# Patient Record
Sex: Male | Born: 1950
Health system: Southern US, Community
[De-identification: ages and names within clinical notes are randomized; demographics above are authoritative.]

## PROBLEM LIST (undated history)

## (undated) DIAGNOSIS — R413 Other amnesia: Secondary | ICD-10-CM

## (undated) DIAGNOSIS — N189 Chronic kidney disease, unspecified: Secondary | ICD-10-CM

## (undated) DIAGNOSIS — I4891 Unspecified atrial fibrillation: Secondary | ICD-10-CM

## (undated) DIAGNOSIS — K219 Gastro-esophageal reflux disease without esophagitis: Secondary | ICD-10-CM

## (undated) DIAGNOSIS — G8194 Hemiplegia, unspecified affecting left nondominant side: Secondary | ICD-10-CM

## (undated) DIAGNOSIS — I77811 Abdominal aortic ectasia: Secondary | ICD-10-CM

## (undated) DIAGNOSIS — N390 Urinary tract infection, site not specified: Secondary | ICD-10-CM

## (undated) DIAGNOSIS — B952 Enterococcus as the cause of diseases classified elsewhere: Secondary | ICD-10-CM

## (undated) DIAGNOSIS — E559 Vitamin D deficiency, unspecified: Secondary | ICD-10-CM

## (undated) DIAGNOSIS — N2581 Secondary hyperparathyroidism of renal origin: Secondary | ICD-10-CM

## (undated) DIAGNOSIS — E039 Hypothyroidism, unspecified: Secondary | ICD-10-CM

## (undated) DIAGNOSIS — I340 Nonrheumatic mitral (valve) insufficiency: Secondary | ICD-10-CM

## (undated) DIAGNOSIS — I351 Nonrheumatic aortic (valve) insufficiency: Secondary | ICD-10-CM

## (undated) DIAGNOSIS — N289 Disorder of kidney and ureter, unspecified: Secondary | ICD-10-CM

## (undated) DIAGNOSIS — E0500 Thyrotoxicosis with diffuse goiter without thyrotoxic crisis or storm: Secondary | ICD-10-CM

## (undated) DIAGNOSIS — J984 Other disorders of lung: Secondary | ICD-10-CM

## (undated) DIAGNOSIS — M109 Gout, unspecified: Secondary | ICD-10-CM

## (undated) DIAGNOSIS — Z8639 Personal history of other endocrine, nutritional and metabolic disease: Secondary | ICD-10-CM

## (undated) DIAGNOSIS — I639 Cerebral infarction, unspecified: Secondary | ICD-10-CM

## (undated) DIAGNOSIS — E785 Hyperlipidemia, unspecified: Secondary | ICD-10-CM

## (undated) DIAGNOSIS — I10 Essential (primary) hypertension: Secondary | ICD-10-CM

## (undated) HISTORY — DX: Other disorders of lung: J98.4

## (undated) HISTORY — DX: Hypomagnesemia: E83.42

## (undated) HISTORY — DX: Urinary tract infection, site not specified: N39.0

## (undated) HISTORY — DX: Hyperlipidemia, unspecified: E78.5

## (undated) HISTORY — DX: Secondary hyperparathyroidism of renal origin: N25.81

## (undated) HISTORY — DX: Cerebral infarction, unspecified: I63.9

## (undated) HISTORY — DX: Hemiplegia, unspecified affecting left nondominant side: G81.94

## (undated) HISTORY — DX: Abdominal aortic ectasia: I77.811

## (undated) HISTORY — DX: Unspecified atrial fibrillation: I48.91

## (undated) HISTORY — DX: Personal history of other endocrine, nutritional and metabolic disease: Z86.39

## (undated) HISTORY — DX: Disorder of kidney and ureter, unspecified: N28.9

## (undated) HISTORY — DX: Gout, unspecified: M10.9

## (undated) HISTORY — DX: Hypothyroidism, unspecified: E03.9

## (undated) HISTORY — DX: Essential (primary) hypertension: I10

## (undated) HISTORY — DX: Thyrotoxicosis with diffuse goiter without thyrotoxic crisis or storm: E05.00

## (undated) HISTORY — DX: Enterococcus as the cause of diseases classified elsewhere: B95.2

## (undated) HISTORY — DX: Vitamin D deficiency, unspecified: E55.9

## (undated) HISTORY — DX: Chronic kidney disease, unspecified: N18.9

---

## 2009-06-22 ENCOUNTER — Emergency Department: Payer: Self-pay | Admitting: Emergency Medicine

## 2013-10-19 ENCOUNTER — Ambulatory Visit: Payer: Self-pay | Admitting: Physician Assistant

## 2014-02-11 DIAGNOSIS — N390 Urinary tract infection, site not specified: Secondary | ICD-10-CM

## 2014-02-11 DIAGNOSIS — B952 Enterococcus as the cause of diseases classified elsewhere: Secondary | ICD-10-CM

## 2014-02-11 DIAGNOSIS — G8194 Hemiplegia, unspecified affecting left nondominant side: Secondary | ICD-10-CM | POA: Insufficient documentation

## 2014-02-11 HISTORY — DX: Enterococcus as the cause of diseases classified elsewhere: B95.2

## 2014-02-11 HISTORY — DX: Hemiplegia, unspecified affecting left nondominant side: G81.94

## 2014-02-11 HISTORY — DX: Enterococcus as the cause of diseases classified elsewhere: N39.0

## 2014-02-20 DIAGNOSIS — I639 Cerebral infarction, unspecified: Secondary | ICD-10-CM

## 2014-02-20 HISTORY — DX: Cerebral infarction, unspecified: I63.9

## 2014-02-21 DIAGNOSIS — I69391 Dysphagia following cerebral infarction: Secondary | ICD-10-CM | POA: Insufficient documentation

## 2014-02-21 DIAGNOSIS — Z8673 Personal history of transient ischemic attack (TIA), and cerebral infarction without residual deficits: Secondary | ICD-10-CM | POA: Insufficient documentation

## 2014-02-28 DIAGNOSIS — Z8739 Personal history of other diseases of the musculoskeletal system and connective tissue: Secondary | ICD-10-CM | POA: Insufficient documentation

## 2014-02-28 DIAGNOSIS — E039 Hypothyroidism, unspecified: Secondary | ICD-10-CM | POA: Insufficient documentation

## 2014-06-15 ENCOUNTER — Ambulatory Visit: Payer: Self-pay | Admitting: Family Medicine

## 2014-06-21 ENCOUNTER — Ambulatory Visit: Payer: Self-pay | Admitting: Family Medicine

## 2014-07-07 ENCOUNTER — Ambulatory Visit: Payer: Self-pay | Admitting: Gastroenterology

## 2014-07-07 LAB — HM COLONOSCOPY

## 2014-07-08 LAB — PATHOLOGY REPORT

## 2014-07-14 HISTORY — PX: POLYPECTOMY: SHX149

## 2014-07-14 HISTORY — PX: OTHER SURGICAL HISTORY: SHX169

## 2014-07-21 ENCOUNTER — Ambulatory Visit: Payer: Self-pay | Admitting: Surgery

## 2014-07-21 ENCOUNTER — Ambulatory Visit: Payer: Self-pay | Admitting: Rheumatology

## 2014-07-21 ENCOUNTER — Ambulatory Visit: Payer: Self-pay | Admitting: Cardiology

## 2014-07-21 LAB — HEPATIC FUNCTION PANEL A (ARMC)
ALBUMIN: 3.5 g/dL (ref 3.4–5.0)
Alkaline Phosphatase: 129 U/L — ABNORMAL HIGH
BILIRUBIN TOTAL: 0.6 mg/dL (ref 0.2–1.0)
Bilirubin, Direct: <0.1 mg/dL (ref 0.00–0.20)
SGOT(AST): 20 U/L (ref 15–37)
SGPT (ALT): 24 U/L
Total Protein: 7.5 g/dL (ref 6.4–8.2)

## 2014-07-21 LAB — CBC WITH DIFFERENTIAL/PLATELET
Basophil #: 0.1 10*3/uL (ref 0.0–0.1)
Basophil %: 0.8 %
EOS PCT: 1.2 %
Eosinophil #: 0.1 10*3/uL (ref 0.0–0.7)
HCT: 39.9 % — ABNORMAL LOW (ref 40.0–52.0)
HGB: 12.9 g/dL — AB (ref 13.0–18.0)
Lymphocyte #: 1.2 10*3/uL (ref 1.0–3.6)
Lymphocyte %: 12.9 %
MCH: 31.1 pg (ref 26.0–34.0)
MCHC: 32.3 g/dL (ref 32.0–36.0)
MCV: 97 fL (ref 80–100)
MONOS PCT: 5.4 %
Monocyte #: 0.5 x10 3/mm (ref 0.2–1.0)
Neutrophil #: 7.1 10*3/uL — ABNORMAL HIGH (ref 1.4–6.5)
Neutrophil %: 79.7 %
Platelet: 197 10*3/uL (ref 150–440)
RBC: 4.14 10*6/uL — ABNORMAL LOW (ref 4.40–5.90)
RDW: 15.2 % — AB (ref 11.5–14.5)
WBC: 8.9 10*3/uL (ref 3.8–10.6)

## 2014-07-21 LAB — BASIC METABOLIC PANEL
Anion Gap: 5 — ABNORMAL LOW (ref 7–16)
BUN: 19 mg/dL — ABNORMAL HIGH (ref 7–18)
CALCIUM: 9.2 mg/dL (ref 8.5–10.1)
CHLORIDE: 103 mmol/L (ref 98–107)
Co2: 31 mmol/L (ref 21–32)
Creatinine: 1.33 mg/dL — ABNORMAL HIGH (ref 0.60–1.30)
EGFR (African American): >60
EGFR (Non-African Amer.): 58 — ABNORMAL LOW
Glucose: 94 mg/dL (ref 65–99)
Osmolality: 280 (ref 275–301)
Potassium: 4.8 mmol/L (ref 3.5–5.1)
SODIUM: 139 mmol/L (ref 136–145)

## 2014-07-21 LAB — URIC ACID: Uric Acid: 5 mg/dL (ref 3.5–7.2)

## 2014-07-27 ENCOUNTER — Inpatient Hospital Stay: Payer: Self-pay | Admitting: Surgery

## 2014-07-28 LAB — BASIC METABOLIC PANEL
Anion Gap: 10 (ref 7–16)
Anion Gap: 9 (ref 7–16)
BUN: 30 mg/dL — ABNORMAL HIGH (ref 7–18)
BUN: 34 mg/dL — ABNORMAL HIGH (ref 7–18)
CALCIUM: 7.9 mg/dL — AB (ref 8.5–10.1)
CHLORIDE: 104 mmol/L (ref 98–107)
CHLORIDE: 104 mmol/L (ref 98–107)
CREATININE: 2.75 mg/dL — AB (ref 0.60–1.30)
Calcium, Total: 7.8 mg/dL — ABNORMAL LOW (ref 8.5–10.1)
Co2: 25 mmol/L (ref 21–32)
Co2: 26 mmol/L (ref 21–32)
Creatinine: 3.2 mg/dL — ABNORMAL HIGH (ref 0.60–1.30)
EGFR (African American): 25 — ABNORMAL LOW
EGFR (Non-African Amer.): 21 — ABNORMAL LOW
GFR CALC AF AMER: 30 — AB
GFR CALC NON AF AMER: 25 — AB
GLUCOSE: 138 mg/dL — AB (ref 65–99)
Glucose: 145 mg/dL — ABNORMAL HIGH (ref 65–99)
Osmolality: 286 (ref 275–301)
Osmolality: 287 (ref 275–301)
POTASSIUM: 4.7 mmol/L (ref 3.5–5.1)
Potassium: 5.2 mmol/L — ABNORMAL HIGH (ref 3.5–5.1)
Sodium: 139 mmol/L (ref 136–145)
Sodium: 139 mmol/L (ref 136–145)

## 2014-07-28 LAB — CBC WITH DIFFERENTIAL/PLATELET
BASOS ABS: 0 10*3/uL (ref 0.0–0.1)
BASOS ABS: 0 10*3/uL (ref 0.0–0.1)
Basophil #: 0 10*3/uL (ref 0.0–0.1)
Basophil %: 0.2 %
Basophil %: 0.1 %
Basophil %: 0.2 %
EOS ABS: 0 10*3/uL (ref 0.0–0.7)
EOS PCT: 0 %
Eosinophil #: 0 10*3/uL (ref 0.0–0.7)
Eosinophil #: 0 10*3/uL (ref 0.0–0.7)
Eosinophil %: 0 %
Eosinophil %: 0 %
HCT: 34.1 % — ABNORMAL LOW (ref 40.0–52.0)
HCT: 29.4 % — ABNORMAL LOW (ref 40.0–52.0)
HCT: 29.4 % — AB (ref 40.0–52.0)
HGB: 11 g/dL — ABNORMAL LOW (ref 13.0–18.0)
HGB: 9.3 g/dL — ABNORMAL LOW (ref 13.0–18.0)
HGB: 9.4 g/dL — AB (ref 13.0–18.0)
LYMPHS ABS: 1.1 10*3/uL (ref 1.0–3.6)
LYMPHS ABS: 1 10*3/uL (ref 1.0–3.6)
LYMPHS PCT: 5.7 %
LYMPHS PCT: 8.2 %
LYMPHS PCT: 7.4 %
Lymphocyte #: 0.9 10*3/uL — ABNORMAL LOW (ref 1.0–3.6)
MCH: 31.4 pg (ref 26.0–34.0)
MCH: 30.7 pg (ref 26.0–34.0)
MCH: 30.9 pg (ref 26.0–34.0)
MCHC: 32.4 g/dL (ref 32.0–36.0)
MCHC: 31.5 g/dL — AB (ref 32.0–36.0)
MCHC: 31.9 g/dL — ABNORMAL LOW (ref 32.0–36.0)
MCV: 97 fL (ref 80–100)
MCV: 98 fL (ref 80–100)
MCV: 97 fL (ref 80–100)
MONO ABS: 1.2 x10 3/mm — AB (ref 0.2–1.0)
MONO ABS: 1.2 x10 3/mm — AB (ref 0.2–1.0)
MONOS PCT: 8.2 %
MONOS PCT: 8.5 %
MONOS PCT: 9.5 %
Monocyte #: 1.1 x10 3/mm — ABNORMAL HIGH (ref 0.2–1.0)
NEUTROS PCT: 82.9 %
Neutrophil #: 13 10*3/uL — ABNORMAL HIGH (ref 1.4–6.5)
Neutrophil #: 11 10*3/uL — ABNORMAL HIGH (ref 1.4–6.5)
Neutrophil #: 10.7 10*3/uL — ABNORMAL HIGH (ref 1.4–6.5)
Neutrophil %: 85.9 %
Neutrophil %: 83.2 %
PLATELETS: 210 10*3/uL (ref 150–440)
PLATELETS: 181 10*3/uL (ref 150–440)
Platelet: 184 10*3/uL (ref 150–440)
RBC: 3.52 10*6/uL — ABNORMAL LOW (ref 4.40–5.90)
RBC: 3.02 10*6/uL — AB (ref 4.40–5.90)
RBC: 3.04 10*6/uL — AB (ref 4.40–5.90)
RDW: 15.6 % — ABNORMAL HIGH (ref 11.5–14.5)
RDW: 15.1 % — AB (ref 11.5–14.5)
RDW: 15.1 % — ABNORMAL HIGH (ref 11.5–14.5)
WBC: 15.2 10*3/uL — ABNORMAL HIGH (ref 3.8–10.6)
WBC: 13.2 10*3/uL — ABNORMAL HIGH (ref 3.8–10.6)
WBC: 12.9 10*3/uL — ABNORMAL HIGH (ref 3.8–10.6)

## 2014-07-28 LAB — MAGNESIUM: Magnesium: 1 mg/dL — ABNORMAL LOW

## 2014-07-29 LAB — COMPREHENSIVE METABOLIC PANEL
ALBUMIN: 2.4 g/dL — AB (ref 3.4–5.0)
AST: 26 U/L (ref 15–37)
Alkaline Phosphatase: 72 U/L
Anion Gap: 6 — ABNORMAL LOW (ref 7–16)
BILIRUBIN TOTAL: 0.4 mg/dL (ref 0.2–1.0)
BUN: 33 mg/dL — ABNORMAL HIGH (ref 7–18)
Calcium, Total: 7.8 mg/dL — ABNORMAL LOW (ref 8.5–10.1)
Chloride: 103 mmol/L (ref 98–107)
Co2: 26 mmol/L (ref 21–32)
Creatinine: 2.48 mg/dL — ABNORMAL HIGH (ref 0.60–1.30)
EGFR (African American): 34 — ABNORMAL LOW
EGFR (Non-African Amer.): 28 — ABNORMAL LOW
Glucose: 117 mg/dL — ABNORMAL HIGH (ref 65–99)
OSMOLALITY: 278 (ref 275–301)
Potassium: 4.4 mmol/L (ref 3.5–5.1)
SGPT (ALT): 17 U/L
Sodium: 135 mmol/L — ABNORMAL LOW (ref 136–145)
TOTAL PROTEIN: 5.5 g/dL — AB (ref 6.4–8.2)

## 2014-07-29 LAB — CBC WITH DIFFERENTIAL/PLATELET
BASOS ABS: 0 10*3/uL (ref 0.0–0.1)
Basophil %: 0.1 %
EOS ABS: 0 10*3/uL (ref 0.0–0.7)
EOS PCT: 0.1 %
HCT: 24.8 % — AB (ref 40.0–52.0)
HGB: 8.2 g/dL — ABNORMAL LOW (ref 13.0–18.0)
LYMPHS ABS: 1 10*3/uL (ref 1.0–3.6)
Lymphocyte %: 9.1 %
MCH: 31.7 pg (ref 26.0–34.0)
MCHC: 33 g/dL (ref 32.0–36.0)
MCV: 96 fL (ref 80–100)
Monocyte #: 1 x10 3/mm (ref 0.2–1.0)
Monocyte %: 8.9 %
Neutrophil #: 8.8 10*3/uL — ABNORMAL HIGH (ref 1.4–6.5)
Neutrophil %: 81.8 %
Platelet: 157 10*3/uL (ref 150–440)
RBC: 2.59 10*6/uL — ABNORMAL LOW (ref 4.40–5.90)
RDW: 14.9 % — AB (ref 11.5–14.5)
WBC: 10.7 10*3/uL — AB (ref 3.8–10.6)

## 2014-07-29 LAB — PATHOLOGY REPORT

## 2014-07-30 LAB — BASIC METABOLIC PANEL
Anion Gap: 9 (ref 7–16)
BUN: 21 mg/dL — ABNORMAL HIGH (ref 7–18)
Calcium, Total: 8 mg/dL — ABNORMAL LOW (ref 8.5–10.1)
Chloride: 101 mmol/L (ref 98–107)
Co2: 25 mmol/L (ref 21–32)
Creatinine: 1.74 mg/dL — ABNORMAL HIGH (ref 0.60–1.30)
EGFR (African American): 51 — ABNORMAL LOW
EGFR (Non-African Amer.): 42 — ABNORMAL LOW
GLUCOSE: 123 mg/dL — AB (ref 65–99)
Osmolality: 274 (ref 275–301)
POTASSIUM: 4.2 mmol/L (ref 3.5–5.1)
Sodium: 135 mmol/L — ABNORMAL LOW (ref 136–145)

## 2014-07-30 LAB — CBC WITH DIFFERENTIAL/PLATELET
Basophil #: 0 10*3/uL (ref 0.0–0.1)
Basophil %: 0.1 %
Eosinophil #: 0 10*3/uL (ref 0.0–0.7)
Eosinophil %: 0.1 %
HCT: 26 % — ABNORMAL LOW (ref 40.0–52.0)
HGB: 8 g/dL — AB (ref 13.0–18.0)
LYMPHS PCT: 4 %
Lymphocyte #: 0.5 10*3/uL — ABNORMAL LOW (ref 1.0–3.6)
MCH: 29.3 pg (ref 26.0–34.0)
MCHC: 30.8 g/dL — ABNORMAL LOW (ref 32.0–36.0)
MCV: 95 fL (ref 80–100)
MONOS PCT: 7.7 %
Monocyte #: 0.9 x10 3/mm (ref 0.2–1.0)
NEUTROS ABS: 10.4 10*3/uL — AB (ref 1.4–6.5)
NEUTROS PCT: 88.1 %
Platelet: 148 10*3/uL — ABNORMAL LOW (ref 150–440)
RBC: 2.74 10*6/uL — AB (ref 4.40–5.90)
RDW: 17 % — ABNORMAL HIGH (ref 11.5–14.5)
WBC: 11.8 10*3/uL — ABNORMAL HIGH (ref 3.8–10.6)

## 2014-07-31 LAB — CBC WITH DIFFERENTIAL/PLATELET
BASOS ABS: 0 10*3/uL (ref 0.0–0.1)
Basophil %: 0.2 %
EOS PCT: 0.1 %
Eosinophil #: 0 10*3/uL (ref 0.0–0.7)
HCT: 25.6 % — ABNORMAL LOW (ref 40.0–52.0)
HGB: 8.4 g/dL — ABNORMAL LOW (ref 13.0–18.0)
LYMPHS PCT: 4 %
Lymphocyte #: 0.5 10*3/uL — ABNORMAL LOW (ref 1.0–3.6)
MCH: 30.9 pg (ref 26.0–34.0)
MCHC: 32.8 g/dL (ref 32.0–36.0)
MCV: 94 fL (ref 80–100)
Monocyte #: 1.2 x10 3/mm — ABNORMAL HIGH (ref 0.2–1.0)
Monocyte %: 9.1 %
Neutrophil #: 11.7 10*3/uL — ABNORMAL HIGH (ref 1.4–6.5)
Neutrophil %: 86.6 %
Platelet: 162 10*3/uL (ref 150–440)
RBC: 2.72 10*6/uL — ABNORMAL LOW (ref 4.40–5.90)
RDW: 16.3 % — AB (ref 11.5–14.5)
WBC: 13.5 10*3/uL — ABNORMAL HIGH (ref 3.8–10.6)

## 2014-07-31 LAB — COMPREHENSIVE METABOLIC PANEL
ALBUMIN: 2 g/dL — AB (ref 3.4–5.0)
ALK PHOS: 83 U/L
AST: 18 U/L (ref 15–37)
Anion Gap: 9 (ref 7–16)
BUN: 19 mg/dL — ABNORMAL HIGH (ref 7–18)
Bilirubin,Total: 1.1 mg/dL — ABNORMAL HIGH (ref 0.2–1.0)
CALCIUM: 8.1 mg/dL — AB (ref 8.5–10.1)
CHLORIDE: 99 mmol/L (ref 98–107)
CREATININE: 1.54 mg/dL — AB (ref 0.60–1.30)
Co2: 25 mmol/L (ref 21–32)
EGFR (African American): 59 — ABNORMAL LOW
EGFR (Non-African Amer.): 49 — ABNORMAL LOW
Glucose: 119 mg/dL — ABNORMAL HIGH (ref 65–99)
Osmolality: 270 (ref 275–301)
POTASSIUM: 4.2 mmol/L (ref 3.5–5.1)
SGPT (ALT): 11 U/L — ABNORMAL LOW
SODIUM: 133 mmol/L — AB (ref 136–145)
TOTAL PROTEIN: 5.8 g/dL — AB (ref 6.4–8.2)

## 2014-08-01 LAB — CBC WITH DIFFERENTIAL/PLATELET
BASOS ABS: 0 10*3/uL (ref 0.0–0.1)
Basophil %: 0.3 %
EOS PCT: 0.5 %
Eosinophil #: 0.1 10*3/uL (ref 0.0–0.7)
HCT: 30.5 % — ABNORMAL LOW (ref 40.0–52.0)
HGB: 9.8 g/dL — ABNORMAL LOW (ref 13.0–18.0)
LYMPHS ABS: 0.6 10*3/uL — AB (ref 1.0–3.6)
LYMPHS PCT: 3.5 %
MCH: 30.3 pg (ref 26.0–34.0)
MCHC: 32.2 g/dL (ref 32.0–36.0)
MCV: 94 fL (ref 80–100)
MONO ABS: 1 x10 3/mm (ref 0.2–1.0)
Monocyte %: 6.2 %
NEUTROS ABS: 14.8 10*3/uL — AB (ref 1.4–6.5)
NEUTROS PCT: 89.5 %
Platelet: 224 10*3/uL (ref 150–440)
RBC: 3.24 10*6/uL — AB (ref 4.40–5.90)
RDW: 16.4 % — ABNORMAL HIGH (ref 11.5–14.5)
WBC: 16.6 10*3/uL — AB (ref 3.8–10.6)

## 2014-08-01 LAB — COMPREHENSIVE METABOLIC PANEL
ALBUMIN: 1.9 g/dL — AB (ref 3.4–5.0)
ALK PHOS: 93 U/L
ALT: 10 U/L — AB
AST: 15 U/L (ref 15–37)
Anion Gap: 9 (ref 7–16)
BILIRUBIN TOTAL: 1.2 mg/dL — AB (ref 0.2–1.0)
BUN: 22 mg/dL — ABNORMAL HIGH (ref 7–18)
CREATININE: 1.52 mg/dL — AB (ref 0.60–1.30)
Calcium, Total: 8.5 mg/dL (ref 8.5–10.1)
Chloride: 100 mmol/L (ref 98–107)
Co2: 24 mmol/L (ref 21–32)
EGFR (African American): >60
GFR CALC NON AF AMER: 50 — AB
Glucose: 117 mg/dL — ABNORMAL HIGH (ref 65–99)
Osmolality: 271 (ref 275–301)
POTASSIUM: 4.2 mmol/L (ref 3.5–5.1)
Sodium: 133 mmol/L — ABNORMAL LOW (ref 136–145)
Total Protein: 6 g/dL — ABNORMAL LOW (ref 6.4–8.2)

## 2014-08-01 LAB — URINE CULTURE

## 2014-08-02 LAB — CBC WITH DIFFERENTIAL/PLATELET
BASOS ABS: 0 10*3/uL (ref 0.0–0.1)
Basophil %: 0.1 %
Eosinophil #: 0 10*3/uL (ref 0.0–0.7)
Eosinophil %: 0.1 %
HCT: 30 % — ABNORMAL LOW (ref 40.0–52.0)
HGB: 9.8 g/dL — ABNORMAL LOW (ref 13.0–18.0)
LYMPHS ABS: 0.3 10*3/uL — AB (ref 1.0–3.6)
Lymphocyte %: 1.5 %
MCH: 30.5 pg (ref 26.0–34.0)
MCHC: 32.5 g/dL (ref 32.0–36.0)
MCV: 94 fL (ref 80–100)
MONOS PCT: 3.6 %
Monocyte #: 0.7 x10 3/mm (ref 0.2–1.0)
NEUTROS ABS: 18.7 10*3/uL — AB (ref 1.4–6.5)
NEUTROS PCT: 94.7 %
Platelet: 295 10*3/uL (ref 150–440)
RBC: 3.21 10*6/uL — AB (ref 4.40–5.90)
RDW: 16.4 % — AB (ref 11.5–14.5)
WBC: 19.8 10*3/uL — AB (ref 3.8–10.6)

## 2014-08-02 LAB — COMPREHENSIVE METABOLIC PANEL
Albumin: 1.8 g/dL — ABNORMAL LOW (ref 3.4–5.0)
Alkaline Phosphatase: 134 U/L — ABNORMAL HIGH
Anion Gap: 10 (ref 7–16)
BUN: 50 mg/dL — ABNORMAL HIGH (ref 7–18)
Bilirubin,Total: 1.4 mg/dL — ABNORMAL HIGH (ref 0.2–1.0)
CHLORIDE: 104 mmol/L (ref 98–107)
CO2: 22 mmol/L (ref 21–32)
Calcium, Total: 8 mg/dL — ABNORMAL LOW (ref 8.5–10.1)
Creatinine: 1.77 mg/dL — ABNORMAL HIGH (ref 0.60–1.30)
EGFR (African American): 50 — ABNORMAL LOW
EGFR (Non-African Amer.): 42 — ABNORMAL LOW
Glucose: 152 mg/dL — ABNORMAL HIGH (ref 65–99)
OSMOLALITY: 288 (ref 275–301)
POTASSIUM: 4.7 mmol/L (ref 3.5–5.1)
SGOT(AST): 20 U/L (ref 15–37)
SGPT (ALT): 9 U/L — ABNORMAL LOW
Sodium: 136 mmol/L (ref 136–145)
TOTAL PROTEIN: 5.7 g/dL — AB (ref 6.4–8.2)

## 2014-08-03 LAB — HEPATIC FUNCTION PANEL A (ARMC)
Albumin: 1.5 g/dL — ABNORMAL LOW (ref 3.4–5.0)
Alkaline Phosphatase: 112 U/L
Bilirubin, Direct: 0.7 mg/dL — ABNORMAL HIGH (ref 0.0–0.2)
Bilirubin,Total: 1 mg/dL (ref 0.2–1.0)
SGOT(AST): 30 U/L (ref 15–37)
SGPT (ALT): 10 U/L — ABNORMAL LOW
Total Protein: 4.9 g/dL — ABNORMAL LOW (ref 6.4–8.2)

## 2014-08-03 LAB — CBC WITH DIFFERENTIAL/PLATELET
BASOS PCT: 0.1 %
Basophil #: 0 10*3/uL (ref 0.0–0.1)
EOS PCT: 0.1 %
Eosinophil #: 0 10*3/uL (ref 0.0–0.7)
HCT: 26.7 % — ABNORMAL LOW (ref 40.0–52.0)
HGB: 8.3 g/dL — AB (ref 13.0–18.0)
LYMPHS PCT: 3.1 %
Lymphocyte #: 0.4 10*3/uL — ABNORMAL LOW (ref 1.0–3.6)
MCH: 29.7 pg (ref 26.0–34.0)
MCHC: 31.1 g/dL — ABNORMAL LOW (ref 32.0–36.0)
MCV: 96 fL (ref 80–100)
Monocyte #: 0.6 x10 3/mm (ref 0.2–1.0)
Monocyte %: 4.3 %
NEUTROS PCT: 92.4 %
Neutrophil #: 12.6 10*3/uL — ABNORMAL HIGH (ref 1.4–6.5)
PLATELETS: 292 10*3/uL (ref 150–440)
RBC: 2.79 10*6/uL — ABNORMAL LOW (ref 4.40–5.90)
RDW: 16.8 % — AB (ref 11.5–14.5)
WBC: 13.6 10*3/uL — AB (ref 3.8–10.6)

## 2014-08-03 LAB — BASIC METABOLIC PANEL
ANION GAP: 9 (ref 7–16)
BUN: 49 mg/dL — ABNORMAL HIGH (ref 7–18)
CHLORIDE: 109 mmol/L — AB (ref 98–107)
Calcium, Total: 7.4 mg/dL — ABNORMAL LOW (ref 8.5–10.1)
Co2: 23 mmol/L (ref 21–32)
Creatinine: 1.72 mg/dL — ABNORMAL HIGH (ref 0.60–1.30)
EGFR (Non-African Amer.): 43 — ABNORMAL LOW
GFR CALC AF AMER: 52 — AB
Glucose: 134 mg/dL — ABNORMAL HIGH (ref 65–99)
OSMOLALITY: 296 (ref 275–301)
Potassium: 4.6 mmol/L (ref 3.5–5.1)
Sodium: 141 mmol/L (ref 136–145)

## 2014-08-03 LAB — AMMONIA: Ammonia, Plasma: 18 mcmol/L (ref 11–32)

## 2014-08-03 LAB — TSH: Thyroid Stimulating Horm: 10.4 u[IU]/mL — ABNORMAL HIGH

## 2014-08-04 LAB — CBC WITH DIFFERENTIAL/PLATELET
Basophil #: 0 10*3/uL (ref 0.0–0.1)
Basophil %: 0.2 %
Eosinophil #: 0.2 10*3/uL (ref 0.0–0.7)
Eosinophil %: 2.2 %
HCT: 21.7 % — AB (ref 40.0–52.0)
HGB: 6.8 g/dL — AB (ref 13.0–18.0)
LYMPHS ABS: 0.6 10*3/uL — AB (ref 1.0–3.6)
Lymphocyte %: 6.1 %
MCH: 30 pg (ref 26.0–34.0)
MCHC: 31.2 g/dL — ABNORMAL LOW (ref 32.0–36.0)
MCV: 96 fL (ref 80–100)
Monocyte #: 0.6 x10 3/mm (ref 0.2–1.0)
Monocyte %: 5.6 %
NEUTROS ABS: 9.1 10*3/uL — AB (ref 1.4–6.5)
Neutrophil %: 85.9 %
Platelet: 291 10*3/uL (ref 150–440)
RBC: 2.25 10*6/uL — AB (ref 4.40–5.90)
RDW: 16.8 % — ABNORMAL HIGH (ref 11.5–14.5)
WBC: 10.6 10*3/uL (ref 3.8–10.6)

## 2014-08-04 LAB — BASIC METABOLIC PANEL
ANION GAP: 9 (ref 7–16)
BUN: 41 mg/dL — ABNORMAL HIGH (ref 7–18)
CALCIUM: 7.7 mg/dL — AB (ref 8.5–10.1)
CHLORIDE: 110 mmol/L — AB (ref 98–107)
CO2: 23 mmol/L (ref 21–32)
Creatinine: 1.82 mg/dL — ABNORMAL HIGH (ref 0.60–1.30)
GFR CALC AF AMER: 49 — AB
GFR CALC NON AF AMER: 40 — AB
GLUCOSE: 119 mg/dL — AB (ref 65–99)
Osmolality: 294 (ref 275–301)
Potassium: 4 mmol/L (ref 3.5–5.1)
Sodium: 142 mmol/L (ref 136–145)

## 2014-08-04 LAB — CULTURE, BLOOD (SINGLE)

## 2014-08-04 LAB — HEMOGLOBIN: HGB: 8.5 g/dL — AB (ref 13.0–18.0)

## 2014-08-05 LAB — BASIC METABOLIC PANEL
Anion Gap: 7 (ref 7–16)
BUN: 26 mg/dL — AB (ref 7–18)
CALCIUM: 7.7 mg/dL — AB (ref 8.5–10.1)
CO2: 23 mmol/L (ref 21–32)
Chloride: 112 mmol/L — ABNORMAL HIGH (ref 98–107)
Creatinine: 1.62 mg/dL — ABNORMAL HIGH (ref 0.60–1.30)
EGFR (African American): 56 — ABNORMAL LOW
GFR CALC NON AF AMER: 46 — AB
Glucose: 88 mg/dL (ref 65–99)
Osmolality: 287 (ref 275–301)
POTASSIUM: 3.8 mmol/L (ref 3.5–5.1)
Sodium: 142 mmol/L (ref 136–145)

## 2014-08-06 LAB — BASIC METABOLIC PANEL
Anion Gap: 9 (ref 7–16)
BUN: 18 mg/dL (ref 7–18)
CO2: 23 mmol/L (ref 21–32)
Calcium, Total: 8.1 mg/dL — ABNORMAL LOW (ref 8.5–10.1)
Chloride: 113 mmol/L — ABNORMAL HIGH (ref 98–107)
Creatinine: 1.49 mg/dL — ABNORMAL HIGH (ref 0.60–1.30)
EGFR (Non-African Amer.): 51 — ABNORMAL LOW
GLUCOSE: 84 mg/dL (ref 65–99)
Osmolality: 290 (ref 275–301)
Potassium: 4 mmol/L (ref 3.5–5.1)
Sodium: 145 mmol/L (ref 136–145)

## 2014-08-06 LAB — CBC WITH DIFFERENTIAL/PLATELET
Basophil #: 0.1 10*3/uL (ref 0.0–0.1)
Basophil %: 0.9 %
EOS ABS: 0.4 10*3/uL (ref 0.0–0.7)
Eosinophil %: 3.3 %
HCT: 28.6 % — ABNORMAL LOW (ref 40.0–52.0)
HGB: 8.8 g/dL — AB (ref 13.0–18.0)
Lymphocyte #: 0.5 10*3/uL — ABNORMAL LOW (ref 1.0–3.6)
Lymphocyte %: 4 %
MCH: 28.8 pg (ref 26.0–34.0)
MCHC: 30.9 g/dL — ABNORMAL LOW (ref 32.0–36.0)
MCV: 93 fL (ref 80–100)
Monocyte #: 0.5 x10 3/mm (ref 0.2–1.0)
Monocyte %: 3.6 %
NEUTROS PCT: 88.2 %
Neutrophil #: 11.5 10*3/uL — ABNORMAL HIGH (ref 1.4–6.5)
Platelet: 370 10*3/uL (ref 150–440)
RBC: 3.06 10*6/uL — ABNORMAL LOW (ref 4.40–5.90)
RDW: 17.7 % — AB (ref 11.5–14.5)
WBC: 13.1 10*3/uL — ABNORMAL HIGH (ref 3.8–10.6)

## 2014-08-07 LAB — CBC WITH DIFFERENTIAL/PLATELET
Basophil #: 0.1 10*3/uL (ref 0.0–0.1)
Basophil %: 1 %
EOS PCT: 3.1 %
Eosinophil #: 0.4 10*3/uL (ref 0.0–0.7)
HCT: 31.8 % — ABNORMAL LOW (ref 40.0–52.0)
HGB: 9.9 g/dL — ABNORMAL LOW (ref 13.0–18.0)
Lymphocyte #: 0.7 10*3/uL — ABNORMAL LOW (ref 1.0–3.6)
Lymphocyte %: 5.1 %
MCH: 29.3 pg (ref 26.0–34.0)
MCHC: 31.3 g/dL — AB (ref 32.0–36.0)
MCV: 93 fL (ref 80–100)
Monocyte #: 0.7 x10 3/mm (ref 0.2–1.0)
Monocyte %: 4.8 %
NEUTROS ABS: 11.9 10*3/uL — AB (ref 1.4–6.5)
Neutrophil %: 86 %
Platelet: 389 10*3/uL (ref 150–440)
RBC: 3.4 10*6/uL — ABNORMAL LOW (ref 4.40–5.90)
RDW: 17.5 % — AB (ref 11.5–14.5)
WBC: 13.8 10*3/uL — ABNORMAL HIGH (ref 3.8–10.6)

## 2014-08-07 LAB — BASIC METABOLIC PANEL
Anion Gap: 7 (ref 7–16)
BUN: 12 mg/dL (ref 7–18)
CALCIUM: 7.7 mg/dL — AB (ref 8.5–10.1)
CHLORIDE: 110 mmol/L — AB (ref 98–107)
Co2: 25 mmol/L (ref 21–32)
Creatinine: 1.21 mg/dL (ref 0.60–1.30)
EGFR (African American): >60
GLUCOSE: 116 mg/dL — AB (ref 65–99)
OSMOLALITY: 284 (ref 275–301)
Potassium: 3.6 mmol/L (ref 3.5–5.1)
SODIUM: 142 mmol/L (ref 136–145)

## 2014-08-07 LAB — T4, FREE: Free Thyroxine: 0.57 ng/dL — ABNORMAL LOW (ref 0.76–1.46)

## 2014-08-08 LAB — CBC WITH DIFFERENTIAL/PLATELET
Basophil #: 0.1 10*3/uL (ref 0.0–0.1)
Basophil %: 0.5 %
EOS ABS: 0.3 10*3/uL (ref 0.0–0.7)
Eosinophil %: 2.3 %
HCT: 32.4 % — AB (ref 40.0–52.0)
HGB: 10.2 g/dL — ABNORMAL LOW (ref 13.0–18.0)
LYMPHS ABS: 0.9 10*3/uL — AB (ref 1.0–3.6)
Lymphocyte %: 7.3 %
MCH: 29.1 pg (ref 26.0–34.0)
MCHC: 31.4 g/dL — ABNORMAL LOW (ref 32.0–36.0)
MCV: 93 fL (ref 80–100)
Monocyte #: 0.7 x10 3/mm (ref 0.2–1.0)
Monocyte %: 5.5 %
NEUTROS ABS: 10.8 10*3/uL — AB (ref 1.4–6.5)
Neutrophil %: 84.4 %
Platelet: 391 10*3/uL (ref 150–440)
RBC: 3.5 10*6/uL — ABNORMAL LOW (ref 4.40–5.90)
RDW: 17.2 % — ABNORMAL HIGH (ref 11.5–14.5)
WBC: 12.8 10*3/uL — AB (ref 3.8–10.6)

## 2014-08-10 LAB — PRO B NATRIURETIC PEPTIDE: B-Type Natriuretic Peptide: 7073 pg/mL — ABNORMAL HIGH (ref 0–125)

## 2014-08-11 ENCOUNTER — Ambulatory Visit: Payer: Self-pay | Admitting: Neurology

## 2014-08-11 LAB — CBC WITH DIFFERENTIAL/PLATELET
BASOS PCT: 0.8 %
Basophil #: 0.1 10*3/uL (ref 0.0–0.1)
Eosinophil #: 0.2 10*3/uL (ref 0.0–0.7)
Eosinophil %: 1.9 %
HCT: 30.9 % — ABNORMAL LOW (ref 40.0–52.0)
HGB: 9.9 g/dL — ABNORMAL LOW (ref 13.0–18.0)
LYMPHS ABS: 1.6 10*3/uL (ref 1.0–3.6)
Lymphocyte %: 17.4 %
MCH: 29.3 pg (ref 26.0–34.0)
MCHC: 31.9 g/dL — AB (ref 32.0–36.0)
MCV: 92 fL (ref 80–100)
Monocyte #: 0.7 x10 3/mm (ref 0.2–1.0)
Monocyte %: 8.1 %
Neutrophil #: 6.6 10*3/uL — ABNORMAL HIGH (ref 1.4–6.5)
Neutrophil %: 71.8 %
PLATELETS: 330 10*3/uL (ref 150–440)
RBC: 3.37 10*6/uL — ABNORMAL LOW (ref 4.40–5.90)
RDW: 16.9 % — ABNORMAL HIGH (ref 11.5–14.5)
WBC: 9.1 10*3/uL (ref 3.8–10.6)

## 2014-08-11 LAB — BASIC METABOLIC PANEL
Anion Gap: 9 (ref 7–16)
BUN: 10 mg/dL (ref 7–18)
CREATININE: 1.61 mg/dL — AB (ref 0.60–1.30)
Calcium, Total: 7.1 mg/dL — ABNORMAL LOW (ref 8.5–10.1)
Chloride: 103 mmol/L (ref 98–107)
Co2: 27 mmol/L (ref 21–32)
EGFR (Non-African Amer.): 46 — ABNORMAL LOW
GFR CALC AF AMER: 56 — AB
Glucose: 98 mg/dL (ref 65–99)
OSMOLALITY: 277 (ref 275–301)
Potassium: 2.7 mmol/L — ABNORMAL LOW (ref 3.5–5.1)
Sodium: 139 mmol/L (ref 136–145)

## 2014-08-11 LAB — VANCOMYCIN, TROUGH: Vancomycin, Trough: 18 ug/mL (ref 10–20)

## 2014-08-11 LAB — PRO B NATRIURETIC PEPTIDE: B-Type Natriuretic Peptide: 4486 pg/mL — ABNORMAL HIGH (ref 0–125)

## 2014-08-12 ENCOUNTER — Inpatient Hospital Stay (HOSPITAL_COMMUNITY): Admit: 2014-08-12 | Payer: Self-pay

## 2014-08-12 ENCOUNTER — Inpatient Hospital Stay
Admission: AD | Admit: 2014-08-12 | Discharge: 2014-08-26 | Disposition: A | Discharge status: Home / Self Care | Payer: Self-pay | Source: Ambulatory Visit | Attending: Internal Medicine | Admitting: Internal Medicine

## 2014-08-12 ENCOUNTER — Ambulatory Visit (HOSPITAL_COMMUNITY)
Admission: AD | Admit: 2014-08-12 | Discharge: 2014-08-12 | Disposition: A | Discharge status: Home / Self Care | Payer: No Typology Code available for payment source | Source: Other Acute Inpatient Hospital | Attending: Internal Medicine | Admitting: Internal Medicine

## 2014-08-12 DIAGNOSIS — A419 Sepsis, unspecified organism: Secondary | ICD-10-CM | POA: Insufficient documentation

## 2014-08-12 DIAGNOSIS — T148XXA Other injury of unspecified body region, initial encounter: Secondary | ICD-10-CM

## 2014-08-12 DIAGNOSIS — J189 Pneumonia, unspecified organism: Secondary | ICD-10-CM

## 2014-08-12 LAB — COMPREHENSIVE METABOLIC PANEL
ALBUMIN: 2 g/dL — AB (ref 3.5–5.2)
ALT: 7 U/L (ref 0–53)
AST: 21 U/L (ref 0–37)
Alkaline Phosphatase: 93 U/L (ref 39–117)
Anion gap: 13 (ref 5–15)
BUN: 14 mg/dL (ref 6–23)
CO2: 25 mEq/L (ref 19–32)
Calcium: 7.5 mg/dL — ABNORMAL LOW (ref 8.4–10.5)
Chloride: 97 mEq/L (ref 96–112)
Creatinine, Ser: 1.59 mg/dL — ABNORMAL HIGH (ref 0.50–1.35)
GFR calc Af Amer: 52 mL/min — ABNORMAL LOW (ref >90)
GFR, EST NON AFRICAN AMERICAN: 45 mL/min — AB (ref >90)
Glucose, Bld: 97 mg/dL (ref 70–99)
Potassium: 3.5 mEq/L — ABNORMAL LOW (ref 3.7–5.3)
SODIUM: 135 meq/L — AB (ref 137–147)
TOTAL PROTEIN: 5.6 g/dL — AB (ref 6.0–8.3)
Total Bilirubin: 0.5 mg/dL (ref 0.3–1.2)

## 2014-08-12 LAB — CREATININE, SERUM
CREATININE: 1.76 mg/dL — AB (ref 0.60–1.30)
EGFR (African American): 51 — ABNORMAL LOW
GFR CALC NON AF AMER: 42 — AB

## 2014-08-12 LAB — MAGNESIUM: Magnesium: 1 mg/dL — ABNORMAL LOW (ref 1.5–2.5)

## 2014-08-12 LAB — DIGOXIN LEVEL: DIGOXIN LVL: 0.4 ng/mL — AB (ref 0.8–2.0)

## 2014-08-13 ENCOUNTER — Other Ambulatory Visit (HOSPITAL_COMMUNITY): Payer: Self-pay

## 2014-08-13 LAB — CBC WITH DIFFERENTIAL/PLATELET
Basophils Absolute: 0 10*3/uL (ref 0.0–0.1)
Basophils Relative: 0 % (ref 0–1)
Eosinophils Absolute: 0.2 10*3/uL (ref 0.0–0.7)
Eosinophils Relative: 2 % (ref 0–5)
HCT: 29.2 % — ABNORMAL LOW (ref 39.0–52.0)
Hemoglobin: 9.7 g/dL — ABNORMAL LOW (ref 13.0–17.0)
Lymphocytes Relative: 10 % — ABNORMAL LOW (ref 12–46)
Lymphs Abs: 1.2 10*3/uL (ref 0.7–4.0)
MCH: 29 pg (ref 26.0–34.0)
MCHC: 33.2 g/dL (ref 30.0–36.0)
MCV: 87.2 fL (ref 78.0–100.0)
Monocytes Absolute: 0.9 10*3/uL (ref 0.1–1.0)
Monocytes Relative: 7 % (ref 3–12)
Neutro Abs: 10.1 10*3/uL — ABNORMAL HIGH (ref 1.7–7.7)
Neutrophils Relative %: 81 % — ABNORMAL HIGH (ref 43–77)
Platelets: 343 10*3/uL (ref 150–400)
RBC: 3.35 MIL/uL — ABNORMAL LOW (ref 4.22–5.81)
RDW: 16.2 % — ABNORMAL HIGH (ref 11.5–15.5)
WBC: 12.4 10*3/uL — ABNORMAL HIGH (ref 4.0–10.5)

## 2014-08-13 LAB — COMPREHENSIVE METABOLIC PANEL
ALK PHOS: 89 U/L (ref 39–117)
ALT: 7 U/L (ref 0–53)
AST: 17 U/L (ref 0–37)
Albumin: 2 g/dL — ABNORMAL LOW (ref 3.5–5.2)
Anion gap: 16 — ABNORMAL HIGH (ref 5–15)
BUN: 13 mg/dL (ref 6–23)
CALCIUM: 7.4 mg/dL — AB (ref 8.4–10.5)
CO2: 21 mEq/L (ref 19–32)
Chloride: 97 mEq/L (ref 96–112)
Creatinine, Ser: 1.57 mg/dL — ABNORMAL HIGH (ref 0.50–1.35)
GFR calc Af Amer: 53 mL/min — ABNORMAL LOW (ref >90)
GFR calc non Af Amer: 46 mL/min — ABNORMAL LOW (ref >90)
Glucose, Bld: 84 mg/dL (ref 70–99)
POTASSIUM: 3.4 meq/L — AB (ref 3.7–5.3)
SODIUM: 134 meq/L — AB (ref 137–147)
TOTAL PROTEIN: 5.7 g/dL — AB (ref 6.0–8.3)
Total Bilirubin: 0.4 mg/dL (ref 0.3–1.2)

## 2014-08-13 LAB — TSH: TSH: 33.12 u[IU]/mL — AB (ref 0.350–4.500)

## 2014-08-13 LAB — PROTIME-INR
INR: 1.38 (ref 0.00–1.49)
PROTHROMBIN TIME: 17.1 s — AB (ref 11.6–15.2)

## 2014-08-13 LAB — URIC ACID: Uric Acid, Serum: 3.1 mg/dL — ABNORMAL LOW (ref 4.0–7.8)

## 2014-08-13 LAB — PROCALCITONIN: Procalcitonin: <0.1 ng/mL

## 2014-08-13 LAB — MAGNESIUM: Magnesium: 1 mg/dL — ABNORMAL LOW (ref 1.5–2.5)

## 2014-08-13 LAB — T4, FREE: Free T4: 0.64 ng/dL — ABNORMAL LOW (ref 0.80–1.80)

## 2014-08-14 LAB — BASIC METABOLIC PANEL
Anion gap: 17 — ABNORMAL HIGH (ref 5–15)
BUN: 13 mg/dL (ref 6–23)
CALCIUM: 7.5 mg/dL — AB (ref 8.4–10.5)
CO2: 21 mEq/L (ref 19–32)
Chloride: 97 mEq/L (ref 96–112)
Creatinine, Ser: 1.48 mg/dL — ABNORMAL HIGH (ref 0.50–1.35)
GFR, EST AFRICAN AMERICAN: 57 mL/min — AB (ref >90)
GFR, EST NON AFRICAN AMERICAN: 49 mL/min — AB (ref >90)
Glucose, Bld: 88 mg/dL (ref 70–99)
POTASSIUM: 3.9 meq/L (ref 3.7–5.3)
SODIUM: 135 meq/L — AB (ref 137–147)

## 2014-08-15 LAB — CBC WITH DIFFERENTIAL/PLATELET
BASOS ABS: 0.1 10*3/uL (ref 0.0–0.1)
BASOS ABS: 0.2 10*3/uL — AB (ref 0.0–0.1)
BASOS PCT: 0 % (ref 0–1)
Basophils Relative: 1 % (ref 0–1)
Eosinophils Absolute: 0.1 10*3/uL (ref 0.0–0.7)
Eosinophils Absolute: 0.2 10*3/uL (ref 0.0–0.7)
Eosinophils Relative: 1 % (ref 0–5)
Eosinophils Relative: 1 % (ref 0–5)
HCT: 30.1 % — ABNORMAL LOW (ref 39.0–52.0)
HEMATOCRIT: 29.1 % — AB (ref 39.0–52.0)
Hemoglobin: 9.7 g/dL — ABNORMAL LOW (ref 13.0–17.0)
Hemoglobin: 10.1 g/dL — ABNORMAL LOW (ref 13.0–17.0)
LYMPHS PCT: 8 % — AB (ref 12–46)
LYMPHS PCT: 8 % — AB (ref 12–46)
Lymphs Abs: 1.2 10*3/uL (ref 0.7–4.0)
Lymphs Abs: 1.2 10*3/uL (ref 0.7–4.0)
MCH: 29.8 pg (ref 26.0–34.0)
MCH: 29.7 pg (ref 26.0–34.0)
MCHC: 33.3 g/dL (ref 30.0–36.0)
MCHC: 33.6 g/dL (ref 30.0–36.0)
MCV: 89.3 fL (ref 78.0–100.0)
MCV: 88.5 fL (ref 78.0–100.0)
MONO ABS: 1.3 10*3/uL — AB (ref 0.1–1.0)
MONO ABS: 1.1 10*3/uL — AB (ref 0.1–1.0)
Monocytes Relative: 8 % (ref 3–12)
Monocytes Relative: 7 % (ref 3–12)
NEUTROS PCT: 83 % — AB (ref 43–77)
Neutro Abs: 12.9 10*3/uL — ABNORMAL HIGH (ref 1.7–7.7)
Neutro Abs: 12.5 10*3/uL — ABNORMAL HIGH (ref 1.7–7.7)
Neutrophils Relative %: 83 % — ABNORMAL HIGH (ref 43–77)
PLATELETS: 309 10*3/uL (ref 150–400)
PLATELETS: 441 10*3/uL — AB (ref 150–400)
RBC: 3.26 MIL/uL — ABNORMAL LOW (ref 4.22–5.81)
RBC: 3.4 MIL/uL — ABNORMAL LOW (ref 4.22–5.81)
RDW: 16.2 % — AB (ref 11.5–15.5)
RDW: 16.1 % — AB (ref 11.5–15.5)
WBC: 15.5 10*3/uL — AB (ref 4.0–10.5)
WBC: 15.2 10*3/uL — ABNORMAL HIGH (ref 4.0–10.5)

## 2014-08-15 LAB — BASIC METABOLIC PANEL
ANION GAP: 14 (ref 5–15)
BUN: 13 mg/dL (ref 6–23)
CHLORIDE: 96 meq/L (ref 96–112)
CO2: 21 mEq/L (ref 19–32)
CREATININE: 1.62 mg/dL — AB (ref 0.50–1.35)
Calcium: 7.4 mg/dL — ABNORMAL LOW (ref 8.4–10.5)
GFR calc non Af Amer: 44 mL/min — ABNORMAL LOW (ref >90)
GFR, EST AFRICAN AMERICAN: 51 mL/min — AB (ref >90)
Glucose, Bld: 98 mg/dL (ref 70–99)
Potassium: 3.7 mEq/L (ref 3.7–5.3)
SODIUM: 131 meq/L — AB (ref 137–147)

## 2014-08-15 LAB — MAGNESIUM: MAGNESIUM: 1 mg/dL — AB (ref 1.5–2.5)

## 2014-08-15 LAB — VANCOMYCIN, TROUGH: VANCOMYCIN TR: 20.6 ug/mL — AB (ref 10.0–20.0)

## 2014-08-16 ENCOUNTER — Other Ambulatory Visit (HOSPITAL_COMMUNITY): Payer: Self-pay

## 2014-08-16 ENCOUNTER — Encounter: Payer: Self-pay | Admitting: Radiology

## 2014-08-16 LAB — CBC WITH DIFFERENTIAL/PLATELET
BASOS ABS: 0.1 10*3/uL (ref 0.0–0.1)
Basophils Relative: 0 % (ref 0–1)
Eosinophils Absolute: 0.2 10*3/uL (ref 0.0–0.7)
Eosinophils Relative: 1 % (ref 0–5)
HEMATOCRIT: 29.6 % — AB (ref 39.0–52.0)
Hemoglobin: 9.8 g/dL — ABNORMAL LOW (ref 13.0–17.0)
Lymphocytes Relative: 7 % — ABNORMAL LOW (ref 12–46)
Lymphs Abs: 1 10*3/uL (ref 0.7–4.0)
MCH: 29.2 pg (ref 26.0–34.0)
MCHC: 33.1 g/dL (ref 30.0–36.0)
MCV: 88.1 fL (ref 78.0–100.0)
MONO ABS: 1.1 10*3/uL — AB (ref 0.1–1.0)
Monocytes Relative: 8 % (ref 3–12)
NEUTROS ABS: 12 10*3/uL — AB (ref 1.7–7.7)
Neutrophils Relative %: 84 % — ABNORMAL HIGH (ref 43–77)
Platelets: 345 10*3/uL (ref 150–400)
RBC: 3.36 MIL/uL — ABNORMAL LOW (ref 4.22–5.81)
RDW: 16 % — AB (ref 11.5–15.5)
WBC: 14.3 10*3/uL — ABNORMAL HIGH (ref 4.0–10.5)

## 2014-08-16 LAB — BASIC METABOLIC PANEL
Anion gap: 14 (ref 5–15)
BUN: 14 mg/dL (ref 6–23)
CO2: 20 meq/L (ref 19–32)
CREATININE: 1.5 mg/dL — AB (ref 0.50–1.35)
Calcium: 7.5 mg/dL — ABNORMAL LOW (ref 8.4–10.5)
Chloride: 96 mEq/L (ref 96–112)
GFR calc non Af Amer: 48 mL/min — ABNORMAL LOW (ref >90)
GFR, EST AFRICAN AMERICAN: 56 mL/min — AB (ref >90)
Glucose, Bld: 117 mg/dL — ABNORMAL HIGH (ref 70–99)
Potassium: 3.7 mEq/L (ref 3.7–5.3)
Sodium: 130 mEq/L — ABNORMAL LOW (ref 137–147)

## 2014-08-17 LAB — CBC
HCT: 27.8 % — ABNORMAL LOW (ref 39.0–52.0)
Hemoglobin: 9 g/dL — ABNORMAL LOW (ref 13.0–17.0)
MCH: 28.8 pg (ref 26.0–34.0)
MCHC: 32.4 g/dL (ref 30.0–36.0)
MCV: 89.1 fL (ref 78.0–100.0)
Platelets: 307 10*3/uL (ref 150–400)
RBC: 3.12 MIL/uL — ABNORMAL LOW (ref 4.22–5.81)
RDW: 16.1 % — AB (ref 11.5–15.5)
WBC: 10.7 10*3/uL — ABNORMAL HIGH (ref 4.0–10.5)

## 2014-08-17 LAB — BASIC METABOLIC PANEL
Anion gap: 13 (ref 5–15)
BUN: 14 mg/dL (ref 6–23)
CO2: 20 mEq/L (ref 19–32)
Calcium: 7.9 mg/dL — ABNORMAL LOW (ref 8.4–10.5)
Chloride: 98 mEq/L (ref 96–112)
Creatinine, Ser: 1.55 mg/dL — ABNORMAL HIGH (ref 0.50–1.35)
GFR, EST AFRICAN AMERICAN: 54 mL/min — AB (ref >90)
GFR, EST NON AFRICAN AMERICAN: 46 mL/min — AB (ref >90)
GLUCOSE: 97 mg/dL (ref 70–99)
POTASSIUM: 3.8 meq/L (ref 3.7–5.3)
Sodium: 131 mEq/L — ABNORMAL LOW (ref 137–147)

## 2014-08-17 LAB — PROCALCITONIN: PROCALCITONIN: 0.25 ng/mL

## 2014-08-18 LAB — CBC WITH DIFFERENTIAL/PLATELET
BASOS ABS: 0.1 10*3/uL (ref 0.0–0.1)
BASOS PCT: 1 % (ref 0–1)
Eosinophils Absolute: 0.4 10*3/uL (ref 0.0–0.7)
Eosinophils Relative: 4 % (ref 0–5)
HCT: 26 % — ABNORMAL LOW (ref 39.0–52.0)
Hemoglobin: 8.3 g/dL — ABNORMAL LOW (ref 13.0–17.0)
Lymphocytes Relative: 11 % — ABNORMAL LOW (ref 12–46)
Lymphs Abs: 1 10*3/uL (ref 0.7–4.0)
MCH: 28 pg (ref 26.0–34.0)
MCHC: 31.9 g/dL (ref 30.0–36.0)
MCV: 87.8 fL (ref 78.0–100.0)
Monocytes Absolute: 0.8 10*3/uL (ref 0.1–1.0)
Monocytes Relative: 8 % (ref 3–12)
NEUTROS ABS: 7.1 10*3/uL (ref 1.7–7.7)
NEUTROS PCT: 76 % (ref 43–77)
Platelets: 335 10*3/uL (ref 150–400)
RBC: 2.96 MIL/uL — ABNORMAL LOW (ref 4.22–5.81)
RDW: 15.8 % — AB (ref 11.5–15.5)
WBC: 9.3 10*3/uL (ref 4.0–10.5)

## 2014-08-18 LAB — RENAL FUNCTION PANEL
ALBUMIN: 1.7 g/dL — AB (ref 3.5–5.2)
ANION GAP: 14 (ref 5–15)
BUN: 16 mg/dL (ref 6–23)
CHLORIDE: 101 meq/L (ref 96–112)
CO2: 20 mEq/L (ref 19–32)
CREATININE: 1.55 mg/dL — AB (ref 0.50–1.35)
Calcium: 8 mg/dL — ABNORMAL LOW (ref 8.4–10.5)
GFR calc Af Amer: 54 mL/min — ABNORMAL LOW (ref >90)
GFR calc non Af Amer: 46 mL/min — ABNORMAL LOW (ref >90)
Glucose, Bld: 106 mg/dL — ABNORMAL HIGH (ref 70–99)
POTASSIUM: 3.6 meq/L — AB (ref 3.7–5.3)
Phosphorus: 2.1 mg/dL — ABNORMAL LOW (ref 2.3–4.6)
Sodium: 135 mEq/L — ABNORMAL LOW (ref 137–147)

## 2014-08-18 LAB — DIGOXIN LEVEL: DIGOXIN LVL: 0.6 ng/mL — AB (ref 0.8–2.0)

## 2014-08-18 LAB — MAGNESIUM: MAGNESIUM: 1.5 mg/dL (ref 1.5–2.5)

## 2014-08-19 LAB — BASIC METABOLIC PANEL
Anion gap: 13 (ref 5–15)
BUN: 13 mg/dL (ref 6–23)
CO2: 21 meq/L (ref 19–32)
Calcium: 8.4 mg/dL (ref 8.4–10.5)
Chloride: 104 mEq/L (ref 96–112)
Creatinine, Ser: 1.55 mg/dL — ABNORMAL HIGH (ref 0.50–1.35)
GFR calc Af Amer: 54 mL/min — ABNORMAL LOW (ref >90)
GFR calc non Af Amer: 46 mL/min — ABNORMAL LOW (ref >90)
Glucose, Bld: 88 mg/dL (ref 70–99)
POTASSIUM: 3.8 meq/L (ref 3.7–5.3)
Sodium: 138 mEq/L (ref 137–147)

## 2014-08-19 LAB — CBC
HCT: 28.7 % — ABNORMAL LOW (ref 39.0–52.0)
HEMOGLOBIN: 9.4 g/dL — AB (ref 13.0–17.0)
MCH: 28.6 pg (ref 26.0–34.0)
MCHC: 32.8 g/dL (ref 30.0–36.0)
MCV: 87.2 fL (ref 78.0–100.0)
Platelets: 371 10*3/uL (ref 150–400)
RBC: 3.29 MIL/uL — AB (ref 4.22–5.81)
RDW: 15.9 % — ABNORMAL HIGH (ref 11.5–15.5)
WBC: 7.7 10*3/uL (ref 4.0–10.5)

## 2014-08-19 LAB — MAGNESIUM: MAGNESIUM: 1.6 mg/dL (ref 1.5–2.5)

## 2014-08-19 LAB — PHOSPHORUS: Phosphorus: 2.1 mg/dL — ABNORMAL LOW (ref 2.3–4.6)

## 2014-08-19 LAB — VANCOMYCIN, TROUGH: Vancomycin Tr: 25.4 ug/mL (ref 10.0–20.0)

## 2014-08-19 LAB — PROCALCITONIN: Procalcitonin: 0.16 ng/mL

## 2014-08-20 LAB — BASIC METABOLIC PANEL
ANION GAP: 14 (ref 5–15)
BUN: 15 mg/dL (ref 6–23)
CHLORIDE: 103 meq/L (ref 96–112)
CO2: 19 mEq/L (ref 19–32)
Calcium: 8.9 mg/dL (ref 8.4–10.5)
Creatinine, Ser: 1.59 mg/dL — ABNORMAL HIGH (ref 0.50–1.35)
GFR calc Af Amer: 52 mL/min — ABNORMAL LOW (ref >90)
GFR calc non Af Amer: 45 mL/min — ABNORMAL LOW (ref >90)
Glucose, Bld: 108 mg/dL — ABNORMAL HIGH (ref 70–99)
POTASSIUM: 3.7 meq/L (ref 3.7–5.3)
Sodium: 136 mEq/L — ABNORMAL LOW (ref 137–147)

## 2014-08-20 LAB — CBC
HCT: 31 % — ABNORMAL LOW (ref 39.0–52.0)
HEMOGLOBIN: 10.1 g/dL — AB (ref 13.0–17.0)
MCH: 28.5 pg (ref 26.0–34.0)
MCHC: 32.6 g/dL (ref 30.0–36.0)
MCV: 87.6 fL (ref 78.0–100.0)
PLATELETS: 392 10*3/uL (ref 150–400)
RBC: 3.54 MIL/uL — AB (ref 4.22–5.81)
RDW: 15.9 % — ABNORMAL HIGH (ref 11.5–15.5)
WBC: 9.4 10*3/uL (ref 4.0–10.5)

## 2014-08-20 LAB — URIC ACID: URIC ACID, SERUM: 3.5 mg/dL — AB (ref 4.0–7.8)

## 2014-08-21 ENCOUNTER — Other Ambulatory Visit (HOSPITAL_COMMUNITY): Payer: Self-pay

## 2014-08-21 LAB — BASIC METABOLIC PANEL
ANION GAP: 15 (ref 5–15)
BUN: 19 mg/dL (ref 6–23)
CO2: 19 mEq/L (ref 19–32)
CREATININE: 1.59 mg/dL — AB (ref 0.50–1.35)
Calcium: 9.3 mg/dL (ref 8.4–10.5)
Chloride: 105 mEq/L (ref 96–112)
GFR calc non Af Amer: 45 mL/min — ABNORMAL LOW (ref >90)
GFR, EST AFRICAN AMERICAN: 52 mL/min — AB (ref >90)
Glucose, Bld: 106 mg/dL — ABNORMAL HIGH (ref 70–99)
Potassium: 3.6 mEq/L — ABNORMAL LOW (ref 3.7–5.3)
SODIUM: 139 meq/L (ref 137–147)

## 2014-08-21 LAB — MAGNESIUM: MAGNESIUM: 1.5 mg/dL (ref 1.5–2.5)

## 2014-08-21 LAB — PHOSPHORUS: Phosphorus: 2.8 mg/dL (ref 2.3–4.6)

## 2014-08-22 LAB — DIGOXIN LEVEL: DIGOXIN LVL: 0.7 ng/mL — AB (ref 0.8–2.0)

## 2014-08-22 LAB — CBC WITH DIFFERENTIAL/PLATELET
BASOS ABS: 0.1 10*3/uL (ref 0.0–0.1)
Basophils Relative: 1 % (ref 0–1)
EOS PCT: 8 % — AB (ref 0–5)
Eosinophils Absolute: 0.9 10*3/uL — ABNORMAL HIGH (ref 0.0–0.7)
HCT: 33.7 % — ABNORMAL LOW (ref 39.0–52.0)
Hemoglobin: 10.9 g/dL — ABNORMAL LOW (ref 13.0–17.0)
LYMPHS PCT: 15 % (ref 12–46)
Lymphs Abs: 1.7 10*3/uL (ref 0.7–4.0)
MCH: 28.5 pg (ref 26.0–34.0)
MCHC: 32.3 g/dL (ref 30.0–36.0)
MCV: 88 fL (ref 78.0–100.0)
Monocytes Absolute: 0.7 10*3/uL (ref 0.1–1.0)
Monocytes Relative: 6 % (ref 3–12)
Neutro Abs: 7.9 10*3/uL — ABNORMAL HIGH (ref 1.7–7.7)
Neutrophils Relative %: 70 % (ref 43–77)
PLATELETS: 485 10*3/uL — AB (ref 150–400)
RBC: 3.83 MIL/uL — ABNORMAL LOW (ref 4.22–5.81)
RDW: 16.3 % — AB (ref 11.5–15.5)
WBC: 11.3 10*3/uL — AB (ref 4.0–10.5)

## 2014-08-22 LAB — PHOSPHORUS: PHOSPHORUS: 2.9 mg/dL (ref 2.3–4.6)

## 2014-08-22 LAB — COMPREHENSIVE METABOLIC PANEL
ALT: 10 U/L (ref 0–53)
AST: 17 U/L (ref 0–37)
Albumin: 2.3 g/dL — ABNORMAL LOW (ref 3.5–5.2)
Alkaline Phosphatase: 206 U/L — ABNORMAL HIGH (ref 39–117)
Anion gap: 15 (ref 5–15)
BUN: 20 mg/dL (ref 6–23)
CALCIUM: 9.1 mg/dL (ref 8.4–10.5)
CO2: 23 mEq/L (ref 19–32)
Chloride: 102 mEq/L (ref 96–112)
Creatinine, Ser: 1.53 mg/dL — ABNORMAL HIGH (ref 0.50–1.35)
GFR calc Af Amer: 54 mL/min — ABNORMAL LOW (ref >90)
GFR calc non Af Amer: 47 mL/min — ABNORMAL LOW (ref >90)
Glucose, Bld: 103 mg/dL — ABNORMAL HIGH (ref 70–99)
Potassium: 3.4 mEq/L — ABNORMAL LOW (ref 3.7–5.3)
SODIUM: 140 meq/L (ref 137–147)
Total Bilirubin: 0.3 mg/dL (ref 0.3–1.2)
Total Protein: 7 g/dL (ref 6.0–8.3)

## 2014-08-22 LAB — MAGNESIUM: Magnesium: 1.3 mg/dL — ABNORMAL LOW (ref 1.5–2.5)

## 2014-08-23 LAB — MAGNESIUM: Magnesium: 1.6 mg/dL (ref 1.5–2.5)

## 2014-08-23 LAB — BASIC METABOLIC PANEL
Anion gap: 18 — ABNORMAL HIGH (ref 5–15)
BUN: 22 mg/dL (ref 6–23)
CHLORIDE: 101 meq/L (ref 96–112)
CO2: 18 mEq/L — ABNORMAL LOW (ref 19–32)
Calcium: 9.5 mg/dL (ref 8.4–10.5)
Creatinine, Ser: 1.51 mg/dL — ABNORMAL HIGH (ref 0.50–1.35)
GFR calc non Af Amer: 47 mL/min — ABNORMAL LOW (ref >90)
GFR, EST AFRICAN AMERICAN: 55 mL/min — AB (ref >90)
Glucose, Bld: 118 mg/dL — ABNORMAL HIGH (ref 70–99)
POTASSIUM: 4.4 meq/L (ref 3.7–5.3)
SODIUM: 137 meq/L (ref 137–147)

## 2014-08-23 LAB — PHOSPHORUS: PHOSPHORUS: 3.4 mg/dL (ref 2.3–4.6)

## 2014-08-24 LAB — BASIC METABOLIC PANEL
ANION GAP: 15 (ref 5–15)
BUN: 23 mg/dL (ref 6–23)
CALCIUM: 9.1 mg/dL (ref 8.4–10.5)
CO2: 20 meq/L (ref 19–32)
Chloride: 102 mEq/L (ref 96–112)
Creatinine, Ser: 1.64 mg/dL — ABNORMAL HIGH (ref 0.50–1.35)
GFR calc non Af Amer: 43 mL/min — ABNORMAL LOW (ref >90)
GFR, EST AFRICAN AMERICAN: 50 mL/min — AB (ref >90)
Glucose, Bld: 98 mg/dL (ref 70–99)
Potassium: 3.9 mEq/L (ref 3.7–5.3)
SODIUM: 137 meq/L (ref 137–147)

## 2014-08-24 LAB — CBC WITH DIFFERENTIAL/PLATELET
BASOS ABS: 0.1 10*3/uL (ref 0.0–0.1)
BASOS PCT: 1 % (ref 0–1)
EOS PCT: 8 % — AB (ref 0–5)
Eosinophils Absolute: 0.9 10*3/uL — ABNORMAL HIGH (ref 0.0–0.7)
HCT: 31 % — ABNORMAL LOW (ref 39.0–52.0)
Hemoglobin: 10.1 g/dL — ABNORMAL LOW (ref 13.0–17.0)
LYMPHS PCT: 18 % (ref 12–46)
Lymphs Abs: 2.1 10*3/uL (ref 0.7–4.0)
MCH: 28.4 pg (ref 26.0–34.0)
MCHC: 32.6 g/dL (ref 30.0–36.0)
MCV: 87.1 fL (ref 78.0–100.0)
Monocytes Absolute: 0.9 10*3/uL (ref 0.1–1.0)
Monocytes Relative: 7 % (ref 3–12)
Neutro Abs: 7.8 10*3/uL — ABNORMAL HIGH (ref 1.7–7.7)
Neutrophils Relative %: 66 % (ref 43–77)
PLATELETS: 413 10*3/uL — AB (ref 150–400)
RBC: 3.56 MIL/uL — ABNORMAL LOW (ref 4.22–5.81)
RDW: 16.2 % — ABNORMAL HIGH (ref 11.5–15.5)
WBC: 11.7 10*3/uL — AB (ref 4.0–10.5)

## 2014-08-25 LAB — CBC WITH DIFFERENTIAL/PLATELET
BASOS ABS: 0.1 10*3/uL (ref 0.0–0.1)
BASOS PCT: 1 % (ref 0–1)
Eosinophils Absolute: 0.9 10*3/uL — ABNORMAL HIGH (ref 0.0–0.7)
Eosinophils Relative: 7 % — ABNORMAL HIGH (ref 0–5)
HCT: 31.1 % — ABNORMAL LOW (ref 39.0–52.0)
Hemoglobin: 10 g/dL — ABNORMAL LOW (ref 13.0–17.0)
LYMPHS PCT: 20 % (ref 12–46)
Lymphs Abs: 2.5 10*3/uL (ref 0.7–4.0)
MCH: 28.8 pg (ref 26.0–34.0)
MCHC: 32.2 g/dL (ref 30.0–36.0)
MCV: 89.6 fL (ref 78.0–100.0)
MONO ABS: 1 10*3/uL (ref 0.1–1.0)
Monocytes Relative: 8 % (ref 3–12)
NEUTROS ABS: 8.2 10*3/uL — AB (ref 1.7–7.7)
NEUTROS PCT: 64 % (ref 43–77)
Platelets: 390 10*3/uL (ref 150–400)
RBC: 3.47 MIL/uL — ABNORMAL LOW (ref 4.22–5.81)
RDW: 16.2 % — AB (ref 11.5–15.5)
WBC: 12.8 10*3/uL — AB (ref 4.0–10.5)

## 2014-08-25 LAB — BASIC METABOLIC PANEL
Anion gap: 17 — ABNORMAL HIGH (ref 5–15)
BUN: 25 mg/dL — ABNORMAL HIGH (ref 6–23)
CHLORIDE: 98 meq/L (ref 96–112)
CO2: 20 meq/L (ref 19–32)
CREATININE: 1.65 mg/dL — AB (ref 0.50–1.35)
Calcium: 8.9 mg/dL (ref 8.4–10.5)
GFR calc non Af Amer: 43 mL/min — ABNORMAL LOW (ref >90)
GFR, EST AFRICAN AMERICAN: 49 mL/min — AB (ref >90)
Glucose, Bld: 96 mg/dL (ref 70–99)
POTASSIUM: 4 meq/L (ref 3.7–5.3)
SODIUM: 135 meq/L — AB (ref 137–147)

## 2014-08-25 LAB — MAGNESIUM: MAGNESIUM: 1.6 mg/dL (ref 1.5–2.5)

## 2014-08-25 LAB — PHOSPHORUS: PHOSPHORUS: 2.3 mg/dL (ref 2.3–4.6)

## 2014-08-26 ENCOUNTER — Encounter: Payer: Self-pay | Admitting: Internal Medicine

## 2014-09-02 LAB — CLOSTRIDIUM DIFFICILE(ARMC)

## 2014-09-13 ENCOUNTER — Encounter: Payer: Self-pay | Admitting: Internal Medicine

## 2014-10-04 LAB — TSH: THYROID STIMULATING HORM: 16.8 u[IU]/mL — AB

## 2014-10-14 ENCOUNTER — Encounter: Payer: Self-pay | Admitting: Internal Medicine

## 2015-02-04 NOTE — Consult Note (Signed)
Brief Consult Note: Diagnosis: afib, High Blood Pressure (Hypertension), colon polyps, history of CVA.   Patient was seen by consultant.   Consult note dictated.   Orders entered.   Comments: 62y/oM with history of CVA with mild left sided weakness, afib, gout admitted for right colon resection for multiple polyps noted on colonoscopy. Post op medical consult requested for acute renal failure and tachycardia  * ARF- with decreased output as well, likely prerenal and ATN as hypotensive.  received a bolus this am- monitor- repeat labs again monitor I&O, continue foley- if doesnt improve- will get renal u./s as well  * Hyperkalemia- hold fluids with Potassium Chloride and repeat BMP. changed to d5 1/2NS   * Afib with rvr- npo now and has NG tube- change metoprolol to IV q6h for today and monitor eliquis on hold due to surgery recent stress test as outpt reportedly normal  * Colon polyps- on colonoscpy by Dr. Allen Norris, status post right colon resection by Dr. Burt Knack POD #1, pain controlled by morphine pca NG tube, NPO- further management by surgery  * Gout- stable, hold meds  * H/o CVA- eliquis on hold for now.  Electronic Signatures: Gladstone Lighter (MD)  (Signed 15-Oct-15 13:17)  Authored: Brief Consult Note   Last Updated: 15-Oct-15 13:17 by Gladstone Lighter (MD)

## 2015-02-04 NOTE — Discharge Summary (Signed)
PATIENT NAME:  Duane, [name] MR#:  [id number] DATE OF BIRTH:  1951-06-21  DATE OF ADMISSION:  07/27/2014 DATE OF DISCHARGE:  08/12/2014  FINAL DIAGNOSES: 1.  Tubulovillous adenoma of the hepatic flexure.  2.  Gout. 3.  Atrial fibrillation.  4.  Hypertension.  5.  History of cerebrovascular accident. 6.  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  Anastomotic leak requiring reoperation, evacuation of hematoma and construction of end-ileostomy.   HOSPITAL COURSE SUMMARY: The patient was brought in as an outpatient for an elective right colon resection, which was performed uneventfully on the 14th of October. He had an extended right colectomy done for a large tubulovillous adenoma of the hepatic flexure. PPathology demonstrated this.  Postoperatively, he was seen by internal medicine for management of his anticoagulation and postoperative tachycardia and renal failure. The patient had been on Eliquis for previous atrial fibrillation. His creatinine was found to be 2.75 and he was gently rehydrated. Urine output improved. The patient remained somewhat confused postoperatively. On postoperative day number 2, the patient continued to be confused. Heart rate was improved. Blood pressure was stable. Urine output was improved. He did receive 1 unit of blood postoperatively. He responded well. On postoperative day number 3, there was concern over an acute gout attack of the left knee and ankle. He was started on colchicine. He did improve. On the 20th, the patient was found to have moderately distended abdomen. White count was 19,000. Creatinine was 1.8. KUB was obtained demonstrating free air. CAT scan demonstrated a large hematoma. Concurrently, the patient looked to have had a large right middle cerebral ischemic stroke. He was taken to the operating room where evacuation of a large hematoma was performed. The patient had construction of an end-ileostomy. Postoperatively, he was in the  intensive care unit. Atrial fibrillation was controlled with the help of cardiology. He was able to be transferred out and extubated. Neurology did see the patient. No other interventions were required. Repeat CT scan demonstrated no evidence of need for evacuation of hematoma. The patient continued to convalesce and on postoperative day number 10, following his reoperation, was deemed suitable and improved for discharge.   DISCHARGE MEDICATIONS: Can be found on the reconciliation form.   DISCHARGE INSTRUCTIONS: Follow up in our office in 2 to 3 weeks. His staples will need to be removed in 7 to 10 days.   ____________________________ Jeannette How Marina Gravel, MD mab:sb D: 08/12/2014 10:00:00 ET T: 08/12/2014 10:13:09 ET JOB#: [id number]  cc: Elta Guadeloupe A. Marina Gravel, MD, <Dictator> Hortencia Conradi MD ELECTRONICALLY SIGNED 08/12/2014 11:13

## 2015-02-04 NOTE — Op Note (Signed)
PATIENT NAME:  Duane [name], Duane [name] MR#:  [id number] DATE OF BIRTH:  12-04-1950  DATE OF PROCEDURE:  07/27/2014  PREOPERATIVE DIAGNOSIS: Tubulovillous adenoma of the hepatic flexure.   POSTOPERATIVE DIAGNOSIS: Tubulovillous adenoma of the hepatic flexure.   PROCEDURE: Extended right hemicolectomy.   SURGEON: Dantrell Schertzer E. Burt Knack, MD.   ASSISTANT: Hunt Oris, PA-S.   INDICATIONS: This is a patient with a TVA noted on endoscopy. Multiple biopsies have been taken and proven to be benign; however, it was quite large and not removable via endoscopic techniques. Dr. Allen Norris has kindly tattooed the lesion for [place].   Preoperatively, we discussed rationale for surgery, the options of observation, risk of bleeding, infection, recurrence, anastomosis, anastomotic leak, and temporary or permanent colostomy or ileostomy. This was all reviewed for him in the preop holding area. He understood and agreed to proceed. His wife was present as well.   FINDINGS: A tattoo in the distal hepatic flexure transverse colon. No noted lymph nodes. No masses in the liver. No other pathology. Appendix was normal appearing.   DESCRIPTION OF PROCEDURE: The patient was induced to general anesthesia. He was given IV antibiotics. VTE prophylaxis was in place. He was prepped and draped in a sterile fashion. A Foley catheter had been placed as well had an NG tube. Once he was prepped and draped, the right lower quadrant transverse incision was performed, opening of the abdomen was performed as well, all with electrocautery. Hemostasis was with electrocautery. The abdominal cavity was explored, and then a palpable small mass in the colon within an area of tattoo was identified at the hepatic flexure. Due to the location of this there was a concern about doing a limited segmental resection due to blood supply and watershed issues of an anastomosis involving the ascending colon or the hepatic flexure. Therefore, it was decided to perform a  formal extended right hemicolectomy. The avascular line was taken down laterally to further help in determining this decision. This was done with electrocautery. No bleeding was noted. The site for anastomosis and resection was determined by the tattooed area. A GIA was fired across the distal area past the tattoo and then again further inspection really showed that there was limited or concerned area for watershed ischemia if the right colon was not removed and an anastomosis had been performed in that way; therefore, the terminal ileum was divided with a  GIA, and then the mesoappendix was taken down between clamps and tied with 0 silk ligatures, some of which were double tied, 3-0 silk suture ligatures were utilized on small bleeding vessels, and hemostasis was found to be adequate at this point.   Time study of the resected ends was performed to ensure that there was no sign of ischemia. There was none. This was a very viable section of both small bowel and transverse colon. Therefore, a side-to-side handsewn anastomosis was performed with an outer layer of 3-0 silk followed by an inner layer of 3-0 Monocryl in a standard fashion. The mesentery rent was closed with figure-of-eight 3-0 silks as well. Again, hemostasis was checked and found to be adequate. Sponge, lap, and needle counts were correct.   Gown, gloves, and surgical pack were changed at this point. Additional Marcaine was placed for a total of 30 mL, and then once assuring that hemostasis was adequate, the anastomosis was viable and there was no sign of hernial rent in the mesentery. A piece of Seprafilm was placed, followed by closure with #1 PDS running sutures. The  subcutaneous tissue was inspected. There was no sign of bleeding or contamination (there had been no contamination during the operation).   The skin edges were closed with staples. A sterile dressing was placed.   The patient tolerated this procedure well. There were no  complications. He was taken to the recovery room in stable condition to be admitted for continued care. Estimated blood loss was approximately 100 mL. Sponge, lap, and needle counts were correct.     ____________________________ Jerrol Banana. Burt Knack, MD rec:at D: 07/27/2014 09:31:24 ET T: 07/27/2014 14:05:24 ET JOB#: [id number]  cc: Jerrol Banana. Burt Knack, MD, <Dictator> Florene Glen MD ELECTRONICALLY SIGNED 07/27/2014 15:22

## 2015-02-04 NOTE — H&P (Signed)
PATIENT NAME:  Duane [name], Duane [name] MR#:  [id number] DATE OF BIRTH:  [birth year]  DATE OF ADMISSION:  07/27/2014  CHIEF COMPLAINT: Large colon polyp.   HISTORY OF PRESENT ILLNESS: This is a 64 year old male patient with a history of a hepatic flexure colon polyp, tubulovillous adenoma, multiple fragments removed at endoscopy showing no invasive adenocarcinoma. He is here for exploration and probable extended right hemicolectomy depending on the location. The lesion has been tattooed and biopsied by Dr. Allen Norris. He has had multiple other polyps, tubular in nature, 1 tubulovillous adenoma in the sigmoid colon which was completely removed with a negative stalk.   PAST MEDICAL HISTORY: Gout, hypertension, atrial fibrillation, anemia, and stroke.   PAST SURGICAL HISTORY: None.   ALLERGIES: None.   MEDICATIONS: Tramadol, allopurinol, atorvastatin, baclofen, Colcrys, fluoxetine, levothyroxine, metoprolol, Senokot, ondansetron, diclofenac, apixaban.   SOCIAL HISTORY: The patient occasionally uses tobacco and alcohol.   REVIEW OF SYSTEMS: A 10 system review has been performed and is negative with the exception of that mentioned in the HPI, and it is documented in the office chart.   PHYSICAL EXAMINATION:  GENERAL: Healthy male patient.  HEENT: No scleral icterus.  NECK: No palpable neck nodes.  CHEST: Clear to auscultation.  CARDIAC: Regular rate and rhythm.  ABDOMEN: Soft, nontender.  EXTREMITIES: Without edema.  NEUROLOGIC: Grossly intact.   CONSULTATIONS: Cardiac clearance has been obtained from Isaias Cowman, MD.   LABORATORY VALUES: Creatinine of 1.33; electrolytes within normal limits. H and H of 12.9 and 40, with a platelet count of 197,000. Normal liver function tests. Biopsy results as above.   ASSESSMENT AND PLAN: This is a patient with a hepatic flexure colon polyp, tubulovillous adenoma. No invasive carcinoma on fragment biopsy. It has been tattooed. I have discussed this case with  Dr. Allen Norris and with the patient.   PLAN: For extended right hemicolectomy, depending on the actual true location of this tumor in relation to the superior mesenteric or middle colic vessels. The rationale for this approach has been discussed with the patient. The risks of bleeding, infection, and recurrence, anastomosis, anastomotic leak necessitating ostomy, was all reviewed with him. He understood and agreed to proceed.   ____________________________ Jerrol Banana Burt Knack, MD rec:MT D: 07/26/2014 15:36:55 ET T: 07/26/2014 15:49:04 ET JOB#: [id number]  cc: Jerrol Banana. Burt Knack, MD, <Dictator> Florene Glen MD ELECTRONICALLY SIGNED 07/27/2014 13:32

## 2015-02-04 NOTE — Consult Note (Signed)
PATIENT NAME:  Duane [name], Duane [name] MR#:  [id number] DATE OF BIRTH:  Dec 17, 1950  DATE OF CONSULTATION:  07/28/2014  REFERRING PHYSICIAN:  Phoebe Perch, MD CONSULTING PHYSICIAN:  Gladstone Lighter, MD  ADMITTING PHYSICIAN: Phoebe Perch, MD  PRIMARY CARE PHYSICIAN: Dr. Sanda Klein   REASON FOR CONSULTATION: Postop tachycardia and acute renal failure.   BRIEF HISTORY: Duane [name] is a 64 year old Caucasian male with past medical history significant for history of CVA with mild residual left-sided weakness, atrial fibrillation on Eliquis, history of hypertension, gout and chronic anemia who was noted to have multiple large colon polyps on the right side on a routine colonoscopy done in September 2015. Because they were not big enough to get snared during colonoscopy, he was admitted to the surgical service for right colon resection. His surgery was done successfully on July 27, 2014. Postoperatively the patient's heart rate has been in 120 to 140 range. Known history of A-fib. His metoprolol was only being given twice a day at this time. His blood pressure has been on the lower side and his urine output has been decreased since the surgery and worsening in his creatinine noted. The patient is placed on a morphine PCA pump at this time, very sleepy, arouses to answer questions but appears comfortable at this time. No active bleeding noted. His surgical site looks clean and good.   PAST MEDICAL HISTORY: 1.  Gout.  2.  Atrial fibrillation.  3.  Chronic anemia.  4.  Hypertension.  5.  History of stroke with left-sided residual weakness and balance problems and memory issues secondary to that.  PAST SURGICAL HISTORY: Tonsillectomy in the past and now right hemicolectomy done on 07/27/2014.  ALLERGIES TO MEDICATIONS: No known drug allergies.   HOME MEDICATIONS:  1.  Allopurinol 300 mg p.o. daily. 2.  Colchicine 0.6 mg 1/2 tablet p.o. at bedtime.  3.  Eliquis 5 mg p.o. b.i.d.  4.  Fluoxetine 20 mg p.o.  daily.  5.  Levothyroxine 75 mcg p.o. daily.  6.  Lipitor 80 mg p.o. at bedtime.  7.  Metoprolol 37.5 mg p.o. b.i.d.  8.  Zofran 4 mg twice a day p.r.n. for nausea. 9.  Tramadol 50 mg p.o. q. 6 hours p.r.n. for pain.   SOCIAL HISTORY: The patient lives at home with his wife. No smoking or alcohol use. He is retired.   FAMILY HISTORY: Significant for heart disease. No cancer in the family.  REVIEW OF SYSTEMS:  CONSTITUTIONAL: No fatigue, fever or weakness.  EYES: No blurred vision, double vision, inflammation or glaucoma.  ENT: No tinnitus, ear pain, hearing loss, epistaxis or discharge.  RESPIRATORY: No cough, wheeze, hemoptysis or COPD. CARDIOVASCULAR: No chest pain, orthopnea, or edema. Positive for arrhythmia. No palpitations or syncope.  GASTROINTESTINAL: No nausea, vomiting, diarrhea. Positive for abdominal pain. No bleeding present.  GENITOURINARY: No hematuria, renal calculus, frequency, or incontinence.  ENDOCRINE: No polyuria, nocturia, thyroid problems, heat or cold intolerance.  HEMATOLOGY: No anemia, easy bruising or bleeding.  SKIN: No acne, rash or lesions.  MUSCULOSKELETAL: No neck, back, shoulder pain, arthritis or gout.  NEUROLOGIC: Positive for history of CVA. No TIA, seizures or ataxia.  PSYCHOLOGICAL: No anxiety, insomnia, depression.   PHYSICAL EXAMINATION: VITAL SIGNS: Temperature 97.7 degrees Fahrenheit, pulse 144, respirations 17, blood pressure 110/75, pulse ox 98% on 2 liters.  GENERAL: Well-built, well-nourished male, lying in bed, not in any acute distress.  HEENT: Normocephalic, atraumatic. Pupils equal, round, and reacting to light. Anicteric sclerae. Extraocular movements intact. Oropharynx  clear without erythema, mass or icterus.  NECK: Supple. No thyromegaly, JVD or carotid bruits. No lymphadenopathy.  LUNGS: Moving air bilaterally. No wheeze or crackles. No use of accessory muscles for breathing.  HEART: S1 and S2 irregular rhythm and rapid rate. No  murmurs, rubs, or gallops heard.  ABDOMEN: Soft. Mild tenderness diffusely around the surgical site with voluntary guarding. No rebound tenderness noted. Hypoactive bowel sounds are present.  EXTREMITIES: No pedal edema. No clubbing or cyanosis. 2+ dorsalis pedis pulses palpable bilaterally.  SKIN: No acne, rash or lesions.  LYMPHATICS: No cervical lymphadenopathy.  NEUROLOGIC: Cranial nerves intact. No focal motor or sensory deficits which are new. Minimal left-sided weakness, subjective, per patient.  PSYCH: The patient is arousable, but alert and oriented x3.   DIAGNOSTIC DATA: WBC 15.2, hemoglobin 11, hematocrit 34.1, platelet count 210,000.   Sodium 139, potassium 5.2, chloride 104, bicarb 25, BUN 30, creatinine 2.75, glucose 145, calcium 7.9.   Chest x-ray showing no acute cardiopulmonary disease. Hyperinflation noted.   EKG is pending at this time.  RECOMMENDATIONS: A 64 year old male with history of cerebrovascular accident with mild left-sided weakness, atrial fibrillation and gout admitted for right colon resection for multiple polyps noted on colonoscopy. Postop medical consultation requested for acute renal failure and tachycardia.  1.  Acute renal failure with decreased output as well, likely prerenal and acute tubular necrosis as the patient was hypotensive. Received a bolus of fluids for the same. Monitor. Repeat labs again. Continuous in and out monitoring. Continue Foley catheter. If does not improve, will need to get a renal ultrasound.  2.  Hyperkalemia. Hold fluids with potassium and repeat BNP. Change the fluids to without potassium.  3.  Atrial fibrillation with rapid ventricular response. Check electrolytes again. The patient is n.p.o., has NG tube. Change metoprolol to IV q. 6 hours and monitor. Hold Eliquis due to postoperative condition.  4.  Recent stress test, which was normal with normal ejection fraction.  5.  Colon polyps on colonoscopy by Dr. Allen Norris, status post  right colon resection by Dr. Burt Knack on 07/27/2014. Postoperative day 1 today. Pain management per surgery. Currently on morphine PCA. He is n.p.o. and NG tube in place.  6.  Gout. Appears stable. Hold medications.  7.  History of cerebrovascular accident. Eliquis is on hold. No new neurological changes noted. 8.  Deep venous thrombosis prophylaxis, on subcutaneous heparin.  CODE STATUS: FULL code.   TIME SPENT IN CONSULTATION: 50 minutes. ____________________________ Gladstone Lighter, MD rk:sb D: 07/28/2014 13:26:10 ET T: 07/28/2014 14:33:59 ET JOB#: [id number]  cc: Gladstone Lighter, MD, <Dictator> Enid Derry, MD Jerrol Banana. Burt Knack, MD Gladstone Lighter MD ELECTRONICALLY SIGNED 07/28/2014 17:45

## 2015-02-04 NOTE — Consult Note (Signed)
PATIENT NAME:  Duane [name], Duane [name] MR#:  [id number] DATE OF BIRTH:  May 04, 1951  DATE OF CONSULTATION:  08/03/2014  CONSULTING PROVIDER:  Kelby Fam. Dorena Cookey, PA-C  INDICATION FOR CONSULT:  Atrial fibrillation.   HISTORY OF PRESENT ILLNESS:  This is a 64 year old Caucasian male with past medical history of chronic atrial fibrillation, hypertension, and thrombolytic CVA who presented to the hospital on October 13 for right hemicolectomy and who had emergent laparotomy for possible anastomotic leak yesterday. He was found to be in atrial fibrillation, thus cardiology was consulted.  I spoke to the patient's wife and daughter who were in the ICU room with him and who state that atrial fibrillation has been present since age 46 and the patient has not been compliant with rate control or anti-arrhythmic medications and is non-compliant with anticoagulation, but he has been taking Eliquis since his CVA on May 10.    PAST MEDICAL HISTORY:  Gout, hypertension, atrial fibrillation, anemia and CVA.   ALLERGIES:  No known drug allergies.   MEDICATIONS:  Tramadol, allopurinol, atorvastatin, Colcrys, fluoxetine, levothyroxine, metoprolol, Senokot, ondansetron, diclofenac, and Eliquis.   SOCIAL HISTORY:  Occasional tobacco and alcohol use.    REVIEW OF SYSTEMS:  Unable to be completed secondary to the patient's sedation and intubation.   PHYSICAL EXAMINATION:   GENERAL:  The patient is sedated and intubated.  HEENT:  No carotid bruit or JVD.  CHEST:  Lungs clear to auscultation.  CARDIAC:  Tachycardic, normal S1, S2.   EXTREMITIES:  No pedal edema.   EKG:  Shows atrial fibrillation, 153 beats per minute with PVCs and nonspecific ST and T changes.  Rate was 110 beats per minute on the monitor with PVCs appreciated.   ASSESSMENT AND PLAN:  Atrial fibrillation. Continue Eliquis.  Start amiodarone drip protocol.  Will obtain echocardiogram to evaluate wall motion. Will follow.   Thank you very much for this  consultation.    ____________________________ Kelby Fam [place] ear:lt D: 08/03/2014 10:38:44 ET T: 08/03/2014 10:46:18 ET JOB#: [id number]  cc: Dyann Ruddle A. Baldwin [place], <Dictator> Kelby Fam Khiley Lieser PA ELECTRONICALLY SIGNED 08/24/2014 15:31

## 2015-02-04 NOTE — Op Note (Signed)
PATIENT NAME:  Duane [name], Duane [name] MR#:  [id number] DATE OF BIRTH:  [birth year]  DATE OF PROCEDURE:  08/02/2014  SURGEON:  Harrell Gave A. Jeryn Bertoni, MD  PREOPERATIVE DIAGNOSIS:  Cerebrovascular accident, anastomotic leak.   POSTOPERATIVE DIAGNOSIS:  Cerebrovascular accident, infected hematoma, possible postanastomotic leak.   ANESTHESIA:  General.   ESTIMATED BLOOD LOSS:  5 mL.   COMPLICATIONS:  None.   SPECIMEN:  Ileocolonic anastomosis.   PROCEDURES PERFORMED: 1.  Exploratory laparotomy.  2.  Evacuation of infected clot.  3.  Resection of previous ileocolic anastomosis.  4.  Placement of end ileostomy.   INDICATION FOR SURGERY:  [name] is a pleasant 63 year old male who is approximately 1 week status post right hemicolectomy, who developed leukocytosis, increased pain, altered mental status, new-onset atrial fibrillation, and new-onset urinary retention. He had a CT scan, which showed a significant amount of air as well as fluid in his belly. He was thus brought to the operating room for exploratory laparotomy.   DETAILS OF PROCEDURE:  Informed consent was obtained. [name] was brought to the operating room suite. He was induced. Endotracheal tube was placed. General anesthesia was administered. His abdomen was prepped and draped in standard surgical fashion. A timeout was then performed, correctly identifying patient name, operative site, and procedure to be performed. A midline incision was made in the fascia. The fascia was incised. Peritoneum was entered. There was a large amount of nonclotting blood in the peritoneal cavity, which did have a mildly foul odor. This was evacuated. I then examined his anastomosis. Upon inspecting one of the number of sutures and placing a tip of a hemostat in there, I did get a small amount of stool and therefore was concerned about the integrity of the anastomosis. I decided thus to resect it and bring up end ileostomy to divert the stool. I used a  GIA 75 to ligate the bowel proximal and distal to this and then used a Kelly clamp and suture ties to tie off the mesentery. I then made an incision in his right lower quadrant and brought up an ostomy. I then irrigated his belly and made sure hemostasis was obtained. I then closed the wound with a running looped #1 PDS ran from both directions and tied in the middle. Staples were then used to close the skin. I then matured the ostomy with 4-0 Monocryl suture and placed an appliance. A dressing was then placed on the wound. The patient was then awoken, extubated, and brought to the postanesthesia care unit. There were no immediate complications. Needle, sponge, and instrument counts were correct at the end of the procedure.    ____________________________ Glena Norfolk. Beni Turrell, MD cal:nb D: 08/04/2014 13:40:46 ET T: 08/05/2014 04:05:32 ET JOB#: [id number]  cc: Harrell Gave A. Isley Zinni, MD, <Dictator> Floyde Parkins MD ELECTRONICALLY SIGNED 08/16/2014 7:05

## 2015-02-04 NOTE — Consult Note (Signed)
Referring Physician:  Florene Glen   Primary Care Physician:  [facility], [address], [place], [place] [address], [phone]  Reason for Consult: Admit Date: 27-Jul-2014  Chief Complaint: colon polyps  Reason for Consult: CVA   History of Present Illness: History of Present Illness:   64 yo RHD M presents to [facility] for elective polyp surgery.  POD#1 from second surgery, pt was noted to be more confused so CT was done which showed a stroke.  Per family, pt has hx of stroke which causes L sided weakness, tingling and some vision problems.  These all got better with rehab.  Yesterday, pt was more confused and had hallucinations.  ROS:  Review of Systems   unobtainable secondary to intubation  Past Medical/Surgical Hx:  Colon Polyps:   Hypertension:   Anemia:   CVA:   irregular heart rate:   kidney stones:   graves disease:   enlarged heart:   atrial fibrilation:   gout:   Exploratory Laparotomy:   R Hemicolectomy:   tonsilectomy:   R arm fx/ repair:   Past Medical/ Surgical Hx:  Past Medical History personally reviewed by me as above   Past Surgical History personally reviewed by me as above   Home Medications: Medication Instructions Last Modified Date/Time  allopurinol 300 mg oral tablet 1 tab(s) orally once a day 14-Oct-15 06:25  Eliquis 5 mg oral tablet 1 tab(s) orally 2 times a day 14-Oct-15 06:25  Lipitor 80 mg oral tablet 1 tab(s) orally once a day (at bedtime) 14-Oct-15 06:25  FLUoxetine 20 mg oral tablet 1 tab(s) orally once a day (in the morning) 14-Oct-15 06:25  levothyroxine 75 mcg (0.075 mg) oral capsule 1 cap(s) orally once a day 14-Oct-15 06:25  traMADol 50 mg oral tablet 1 tab(s) orally every 6 hours, As Needed 14-Oct-15 06:25  ondansetron 4 mg oral tablet 1 tab(s) orally 2 times a day, As Needed 14-Oct-15 06:25  metoprolol 37.5 milligram(s) orally 2 times a day 14-Oct-15 06:25  colchicine 0.6 mg oral capsule 0.5  cap(s) orally once a day (at bedtime) 14-Oct-15 06:25   Allergies:  No Known Allergies:   Allergies:  Allergies NKDA   Social/Family History: Employment Status: retired  Lives With: spouse  Living Arrangements: house  Social History: no tob, no EtOH, no illicits  Family History: no stroke, no seizure   Vital Signs: **Vital Signs.:   21-Oct-15 18:10  Vital Signs Type Routine  Temperature Source oral  Pulse Pulse 88  Pulse source if not from Vital Sign Device per cardiac monitor  Respirations Respirations 18  Systolic BP Systolic BP 646  Diastolic BP (mmHg) Diastolic BP (mmHg) 64  Mean BP 80  Pulse Ox % Pulse Ox % 100  Pulse Ox Activity Level  At rest  Oxygen Delivery Ventilator Assisted  Telemetry pattern Cardiac Rhythm Atrial fibrillation   Physical Exam: General: nl weight, moderate distress  HEENT: normocephalic, sclera mildly icteric, oropharynx clear  Neck: supple, no JVD, no bruits  Chest: CTA B, no wheezing, good movement on vent  Cardiac: RRR, no murmurs, no edema, 2+ pulses  Extremities: no C/C/E, FROM   Neurologic Exam: Mental Status: intubated, sedated, opens to pain but does not track or follow, GCS 5T  Cranial Nerves: pupils 65m B and reactive, absent L corneal, good R corneal, intact Dolls, decent cough  Motor Exam: no movement in L UE, mild triple flexion in L LE, slightly flexes to pain on R,  nl tone  Deep Tendon Reflexes: Babinski on L, mute on R, 1+/4 B  Sensory Exam: no response to pain on L, grimace to pain on R  Coordination: untestable   Lab Results: LabObservation:  21-Oct-15 14:17   OBSERVATION Reason for Test  Hepatic:  20-Oct-15 07:21   Bilirubin, Total  1.4  Alkaline Phosphatase  134 (46-116 NOTE: New Reference Range 05/03/14)  SGPT (ALT)  9 (14-63 NOTE: New Reference Range 05/03/14)  SGOT (AST) 20  Total Protein, Serum  5.7  Albumin, Serum  1.8  Pathology:  14-Oct-15 12:00   Pathology Report ========== TEST NAME ==========   ========= RESULTS =========  = REFERENCE RANGE =  PATHOLOGY REPORT  Pathology Report .                               [   Final Report         ]                   Material submitted:         Marland Kitchen PART A: RIGHT COLON PART B: APPENDIX .                               [   Final Report         ]                   Pre-operative diagnosis:                                        . COLONIC POLYP RIGHT HEMICOLECTOMY .             [   Final Report         ]                   ********************************************************************** Diagnosis: Part A: RIGHT COLON; RIGHT HEMICOLECTOMY: - TUBULOVILLOUS ADENOMA, 2.7 CM, WITH FOCAL HIGH GRADE DYSPLASIA. - TATTOO INK. - MUCOSAL METALLIC CLIP IN PROXIMAL COLON, CONSISTENT WITH RECENT BIOPSY SITE. - TEN UNREMARKABLE LYMPH NODES (0/10). - UNREMARKABLE PROXIMAL SMALL INTESTINAL AND DISTAL COLONIC MARGINS. - NEGATIVE FOR MALIGNANCY. . Part B: APPENDIX: - APPENDIX WITH FIBROUS OBLITERATION OF THE TIP. - NEGATIVE FOR DYSPLASIA AND MALIGNANCY. . Comment The colon was cleared chemically and re-explored for additional lymph nodes. RUB/07/29/2014 ********************************************************************** .                               [   Final Report         ]                   Electronically signed:                                     Marland Kitchen Hulda Humphrey, MD, Pathologist .                               [   Aliene Altes         ]  Gross description:                                         . A.  Received in a formalin filled container labeled Servando Salina and right colon is an unopened right hemicolectomy specimen consisting of a portionof terminal ileum, cecum and right colon.   The terminal ileum measures 6.0 cm in length x 4.0 cm in circumference and the cecum and right colon measure 23.0 cm in length and ranges from 7.0 to 10.5 cm in circumference.  The serosa is covered by pericolonic fat.  On  opening, the lumen contains a scanty amount of fecal matter.  The mucosa of the terminal ileum shows somewhat flattened mucosal folds but is otherwise unremarkable.  The ileocecal valve is unremarkable. Located 3 cm away from the ileocecal valve and 18.0 cm away from the distal margin of resection there is a metallic biopsy clip that is inserted into the mucosal surface, and the tissue surrounding it is somewhat hemorrhagic.  There is no discreet mass or polyp noted inthis area.  Located 3.0 cm away from the distal margin of resection and 22.0 cm away from the proximal margin of resection there is a pink red raised, polypoid mass that measures 2.5 x 2.7 x 0.5 cm in greatest dimensions.  The mucosa under this area has been previously inked with surgical ink.  On sectioning, this mass appears to be grossly confined to the mucosa and does not invade the underlying wall, serosa, or pericolonic fat.  There is only a very scanty amount of pericolonic fat overlying the polyp in this area, and the serosa in this area is inked blue.  The mass is grossly located 4.0 cm away from the radial fatty margin.  The rest of the colonic mucosa is unremarkable.  Dissection and chemical clearing of the pericolonic fat reveals 7 pink red lymph nodes ranging from 0.3 to 0.6 cm in diameter.  Representative sections are submitted and designated: A1 - terminal ileum margin; A2 - distal colonic margin; A3-A4 - sections of mucosa underlying metallic marker; A5 -radial fat margin; A6-A9 - entire polypoid mass; A10 - three lymph nodes; A11-A12 - one lymph node per cassette; A13 - two possible lymph node candidates after chemical clearing; A14-A17 - sections of blood vessels around pericolonic fat (to facilitate lymph node discovery). Total is 17 cassettes in blocks A1-A17. . B.  Received in a formalin filled container labeled Servando Salina and appendix is an appendix measuring 3.8 cm in length x 0.4 cm in  diameter.  The serosal surface is pinkred and relatively smooth.  A length of undesignated suture is tied to the proximal end of the specimen. On sectioning, the lumen is pin point.  The entire appendix except for the attached periappendiceal fat is submitted in two cassettes designated B1- B2. BMD/ASB .                               [   Final Report         ]                   Pathologist provided ICD-10: D12.2 .                               [  Final Report         ]                   CPT                       . Barry            No: [id number]           [address] [place], [place] [address]           Darden Dates, MD            [phone]    Co: [phone]   Result(s) reported on 29 Jul 2014 at 05:56PM.  Routine BB:  15-Oct-15 14:22   Crossmatch Unit 1 Cancelled  Crossmatch Unit 2 Transfused  20-Oct-15 20:54   ABO Group + Rh Type A Positive  Antibody Screen NEGATIVE (Result(s) reported on 02 Aug 2014 at 10:09PM.)  Routine Micro:  17-Oct-15 19:43   Organism Name COAGULASE NEGATIVE STAPHYLOCOCCUS  Organism Quantity 15,000 CFU/ML  Micro Text Report URINE CULTURE   ORGANISM 1                15,000 CFU/ML  COAGULASE NEGATIVE STAPHYLOCOCCUS   COMMENT                   CALL LAB IF SENSITIVITY TESTING REQUIRED   ANTIBIOTIC                       Specimen Source CLEAN CATCH  Organism 1 15,000 CFU/ML  COAGULASE NEGATIVE STAPHYLOCOCCUS  Culture Comment CALL LAB IF SENSITIVITY TESTING REQUIRED  Result(s) reported on 01 Aug 2014 at 02:35PM.  Routine Chem:  15-Oct-15 13:41   Magnesium, Serum  1.0 (1.8-2.4 THERAPEUTIC RANGE: 4-7 mg/dL TOXIC: > 10 mg/dL  -----------------------)    14:22   Result Comment XMATCH - SPECIMEN EXPIRED - [facility]  Result(s) reported on 01 Aug 2014 at 09:09AM.  21-Oct-15 04:02   Glucose, Serum  134  BUN  49  Creatinine (comp)  1.72  Sodium, Serum 141  Potassium, Serum 4.6  Chloride,  Serum  109  CO2, Serum 23  Calcium (Total), Serum  7.4  Anion Gap 9  Osmolality (calc) 296  eGFR (African American)  52  eGFR (Non-African American)  43 (eGFR values <34m/min/1.73 m2 may be an indication of chronic kidney disease (CKD). Calculated eGFR, using the MRDR Study equation, is useful in  patients with stable renal function. The eGFR calculation will not be reliable in acutely ill patients when serum creatinine is changing rapidly. It is not useful in patients on dialysis. The eGFR calculation may not be applicable to patients at the low and high extremes of body sizes, pregnant women, and vetetarians.)  Routine Hem:  21-Oct-15 04:02   WBC (CBC)  13.6  RBC (CBC)  2.79  Hemoglobin (CBC)  8.3  Hematocrit (CBC)  26.7  Platelet Count (CBC) 292  MCV 96  MCH 29.7  MCHC  31.1  RDW  16.8  Neutrophil % 92.4  Lymphocyte % 3.1  Monocyte % 4.3  Eosinophil % 0.1  Basophil % 0.1  Neutrophil #  12.6  Lymphocyte #  0.4  Monocyte # 0.6  Eosinophil # 0.0  Basophil # 0.0 (Result(s) reported on 03 Aug 2014 at 04:25AM.)   Radiology Results: CT:  20-Oct-15 12:59, CT Head Without Contrast  CT Head Without Contrast   REASON FOR EXAM:    AMS  COMMENTS:       PROCEDURE: CT  - CT HEAD WITHOUT CONTRAST  - Aug 02 2014 12:59PM     CLINICAL DATA:  Confusion this morning, abdominal pain, leukocytosis    EXAM:  CT HEAD WITHOUT CONTRAST    TECHNIQUE:  Contiguous axial images were obtained from the base of the skull  through the vertex without intravenous contrast.    COMPARISON:  None  FINDINGS:  Generalized atrophy.    Normal ventricular morphology.    No midline shift or mass effect.    Decreased attenuation in RIGHTtemporal lobe with sulcal effacement  and loss of gray-white differentiation compatible with large  infarct.    Old infarct involving the RIGHT basal ganglia.    Additional small cortical infarct age-indeterminate in RIGHT  parietal lobe.  No  intracranial hemorrhage, mass lesion or extra-axial fluid  collection.    Bones and sinuses unremarkable.     IMPRESSION:  Large RIGHT temporal and small RIGHT parietal infarcts.    Old infarct of the RIGHT basal ganglia.    No definite intracranial hemorrhage.      Electronically Signed    By: Lavonia Dana M.D.    On: 08/02/2014 13:12         Verified By: Burnetta Sabin, M.D.,    21-Oct-15 08:16, CT Head Without Contrast  CT Head Without Contrast   REASON FOR EXAM:    New sided weakness, eval for progression of   CVA/edema/bleed  COMMENTS:       PROCEDURE: CT  - CT HEAD WITHOUT CONTRAST  - Aug 03 2014  8:16AM     CLINICAL DATA:  Increased lethargy and confusion; history of  previous CVA and residual mild left hemi pairs cysts; history of  atrial fibrillation ; recent surgery for colonic polyps now with  acute renal failure and tachycardia    EXAM:  CT HEAD WITHOUT CONTRAST    TECHNIQUE:  Contiguous axial images were obtained from the base of the skull  through the vertex without intravenous contrast.    COMPARISON:  Noncontrast CT scan of the brain of August 02, 2014    FINDINGS:  Again demonstrated is abnormal hypodensity in the right temporal and  anterior parietal lobes consistent with acute ischemic change. An  old basal ganglia infarction is present and stable. There is no  significant shift of the midline. There is no acute intracranial  hemorrhage. The cerebellum and brainstem are unremarkable.    The observed paranasal sinuses and mastoid air cells are clear. An  oro tracheal tube is in place.   IMPRESSION:  Acute right temporal and anterior parietal lobe ischemic infarction  little changed from yesterday's study. There is no evidence of  hemorrhagic transformation or of increased intracranial pressure. An  old right basal ganglia ischemic infarction is present.      Electronically Signed    By: David  Martinique    On: 08/03/2014 08:24          Verified By: DAVID A. Martinique, M.D., MD   Radiology Impression: Radiology Impression: CT of head personally reviewed by me and shows an old R caudate infarct and a subacute R inferior MCA infarct   Impression/Recommendations: Recommendations:   prior notes reviewed by me reviewed by me  R MCA infarct- there looks to be extension and it is unclear if this is from  old stroke or a new stroke in the same vascular territory.  This can cause some sleepiness but likely to cause moderate L UE hemiparesis, severe dysphagia most likely requiring PEG and visual fiels abnormalities.  Suspect Afib is the culprit  Encephalopathy-  moderate, this is multi-factorial secondary to infections, uremia and probably nutritional;  liver may be contributing as well.  This is why pt is likely hallucinating and sleepy continue to treat infections good hydration need to lower BUN check LFTs, ammonia, TSH, B12 would hold Eloquis for now due to risk of hemorrhagic conversion over the next 3-5 days start ASA 70m daily  no need for MRI currently wean sedation as tolerated needs carotids and echo discussion with family today  will follow  Electronic Signatures: SJamison Neighbor(MD)  (Signed 21-Oct-15 19:36)  Authored: REFERRING PHYSICIAN, Primary Care Physician, Consult, History of Present Illness, Review of Systems, PAST MEDICAL/SURGICAL HISTORY, HOME MEDICATIONS, ALLERGIES, Social/Family History, NURSING VITAL SIGNS, Physical Exam-, LAB RESULTS, RADIOLOGY RESULTS, Recommendations   Last Updated: 21-Oct-15 19:36 by SJamison Neighbor(MD)

## 2015-02-04 NOTE — Consult Note (Signed)
PATIENT NAME:  Duane [name], Duane [name] MR#:  [id number] DATE OF BIRTH:  1951-01-28  DATE OF CONSULTATION:  08/10/2014  CONSULTING PHYSICIAN:  Cheral Marker. Ola Spurr, MD  REFERRING PHYSICIAN:  Dr. Margaretmary Eddy.  REASON FOR CONSULTATION:  Pneumonia.  HISTORY OF PRESENT ILLNESS: This is a pleasant 64 year old gentleman who was originally admitted October 14 for elective hemicolectomy for colonic polyps. He had relatively successful surgery until October 20th when he had increased white count, tachycardia, and was found to have free air. He had repeat surgery on October 20th and was found to have an anastamotic leak and an infected hematoma. He went through repeat surgery on October 20th. He remained in the intensive care unit for several days after and was intubated. He has since been transferred out of the intensive care unit and is clinically improving; however, chest x-ray continues to show pneumonia and he has a cough and some shortness of breath. We are consulted for further antibiotic management and suggestions.   PAST MEDICAL HISTORY:  Gout, hypertension, atrial fibrillation, anemia, and stroke.   PAST SURGICAL HISTORY:  As above. None prior.   ALLERGIES:  None.   SOCIAL HISTORY:  Occasional tobacco or alcohol use.   FAMILY HISTORY:  Noncontributory.   REVIEW OF SYSTEMS:  Eleven systems reviewed and negative except as per HPI.    ALLERGIES: None.  CURRENT ANTIBIOTICS: Include vancomycin, Zosyn, and Levaquin. Zosyn was begun October 20th and continued to currently. Vancomycin was begun October 26th. Levofloxacin was added on 10/24.    PHYSICAL EXAMINATION:  VITAL SIGNS: Temperature 98.3, pulse 83, blood pressure 102/61, respirations 20, sat of 98% on room air.  GENERAL: He is quite sleepy but arousable.  HEENT: Pupils equal, round, and reactive to light and accommodation. Extraocular movements are intact. Sclerae are anicteric.  OROPHARYNX: Clear but mucous membranes are dry.  NECK: Supple.   HEART: Regular.  LUNGS: Decreased breath sounds at the bases.  ABDOMEN: Soft, nontender, slightly distended. He has a well approximated abdominal incision scar.   EXTREMITIES: He has trace edema bilateral lower extremities.  NEUROLOGIC: He is lethargic from some medications he has received but otherwise is interactive and moves all 4 extremities.   LABORATORY DATA: White blood count 12.8, hemoglobin 10.3, platelets 391,000. Differential shows a neutrophil count of 10.8. Renal function shows a creatinine of 1.21. BNP is elevated at 7,073. LFTs are relatively normal.   IMAGING: Chest x-ray done October 26th shows persistent bilateral pulmonary infiltrates related to CHF and/or pneumonia. There is a small left pleural effusion. Cardiomegaly. Echocardiogram done October 21st revealed an EF of 55% to 60%, severely dilated left atrium. There is diastolic dysfunction, severe mitral valve regurg.  MICROBIOLOGY: No sputum cultures are available. Blood cultures from October 17th had 1 of 2 coag-negative staph.   IMPRESSION: A 63 year old gentleman admitted for elective hemicolectomy complicated on October [id number] by a leak at the anastomotic site. He underwent washout at that time and was intubated on the unit. She since improved this on the floor but still has chest x-ray findings of possible pneumonia versus congestive heart failure, as well as some sputum and cough. Clinically, however, his white count is relatively low and he has not had a fever. He is on room air. I do not think bacterial infection is the main process here. I think likely some of the changes noted on the chest x-ray are due to fluid, especially with elevated BNP and the abnormal echocardiogram.   RECOMMENDATIONS:  1. Induce sputum for bacterial  culture.  2. Discontinue levofloxacin.  3. Continue vancomycin and Zosyn pending cultures. However, if he continues to improve, I would suggest stopping antibiotics as he has received 8 days of  Zosyn at this point and several days of vancomycin.   Thank you for the consult. I will be glad to follow with you.     ____________________________ Cheral Marker. Ola Spurr, MD dpf:lt D: 08/10/2014 20:57:00 ET T: 08/10/2014 23:43:01 ET JOB#: [id number]  cc: Cheral Marker. Ola Spurr, MD, <Dictator> Vika Buske Ola Spurr MD ELECTRONICALLY SIGNED 08/16/2014 13:35

## 2015-02-06 LAB — SURGICAL PATHOLOGY

## 2015-04-13 ENCOUNTER — Telehealth: Payer: Self-pay

## 2015-04-13 DIAGNOSIS — Z433 Encounter for attention to colostomy: Secondary | ICD-10-CM

## 2015-04-13 NOTE — Telephone Encounter (Signed)
I spoke with Belenda Cruise, they would like a referral to surgery. They wanted to know if you knew of any surgeons at [facility] that you can recomened him to be referred to.

## 2015-04-13 NOTE — Telephone Encounter (Signed)
Patient wife Belenda Cruise called wanting to talk to Amy about getting husbands colostomy bag reversed. Wants to see about getting a referral to a doctor at [facility]. [phone]

## 2015-04-13 NOTE — Telephone Encounter (Signed)
That's fine

## 2015-04-13 NOTE — Addendum Note (Signed)
Addended by: Elmore Hyslop, Satira Anis on: 04/13/2015 05:37 PM   Modules accepted: Orders

## 2015-05-04 ENCOUNTER — Other Ambulatory Visit: Payer: Self-pay | Admitting: Family Medicine

## 2015-05-04 NOTE — Telephone Encounter (Signed)
Routing to provider  

## 2015-06-01 DIAGNOSIS — N184 Chronic kidney disease, stage 4 (severe): Secondary | ICD-10-CM | POA: Insufficient documentation

## 2015-06-01 DIAGNOSIS — I4891 Unspecified atrial fibrillation: Secondary | ICD-10-CM | POA: Insufficient documentation

## 2015-06-01 DIAGNOSIS — I10 Essential (primary) hypertension: Secondary | ICD-10-CM | POA: Insufficient documentation

## 2015-06-01 DIAGNOSIS — E785 Hyperlipidemia, unspecified: Secondary | ICD-10-CM | POA: Insufficient documentation

## 2015-06-01 DIAGNOSIS — E0500 Thyrotoxicosis with diffuse goiter without thyrotoxic crisis or storm: Secondary | ICD-10-CM | POA: Insufficient documentation

## 2015-06-01 DIAGNOSIS — I482 Chronic atrial fibrillation, unspecified: Secondary | ICD-10-CM | POA: Insufficient documentation

## 2015-06-01 DIAGNOSIS — E890 Postprocedural hypothyroidism: Secondary | ICD-10-CM | POA: Insufficient documentation

## 2015-06-01 DIAGNOSIS — M1A30X0 Chronic gout due to renal impairment, unspecified site, without tophus (tophi): Secondary | ICD-10-CM | POA: Insufficient documentation

## 2015-06-01 DIAGNOSIS — J984 Other disorders of lung: Secondary | ICD-10-CM | POA: Insufficient documentation

## 2015-06-02 ENCOUNTER — Telehealth: Payer: Self-pay

## 2015-06-02 NOTE — Telephone Encounter (Signed)
Patty from [facility] called, patient was in their office today for a follow up and they didn't want to have labs drawn today and again next week here for his appt with [place]. They want to know if we will order a renal function panel (diagnosis N18.3) and faxed a copy of the results to them.

## 2015-06-06 ENCOUNTER — Other Ambulatory Visit: Payer: Self-pay | Admitting: Family Medicine

## 2015-06-06 NOTE — Telephone Encounter (Signed)
Routing to provider  

## 2015-06-07 NOTE — Telephone Encounter (Signed)
TSH was normal in May, 2.690 Rx approved

## 2015-06-09 ENCOUNTER — Encounter: Payer: Self-pay | Admitting: Family Medicine

## 2015-06-09 ENCOUNTER — Ambulatory Visit (INDEPENDENT_AMBULATORY_CARE_PROVIDER_SITE_OTHER): Payer: No Typology Code available for payment source | Admitting: Family Medicine

## 2015-06-09 VITALS — BP 127/69 | HR 81 | Temp 98.4°F | Ht 75.0 in | Wt 209.0 lb

## 2015-06-09 DIAGNOSIS — M1A30X0 Chronic gout due to renal impairment, unspecified site, without tophus (tophi): Secondary | ICD-10-CM | POA: Diagnosis not present

## 2015-06-09 DIAGNOSIS — F329 Major depressive disorder, single episode, unspecified: Secondary | ICD-10-CM | POA: Diagnosis not present

## 2015-06-09 DIAGNOSIS — R748 Abnormal levels of other serum enzymes: Secondary | ICD-10-CM | POA: Diagnosis not present

## 2015-06-09 DIAGNOSIS — Z433 Encounter for attention to colostomy: Secondary | ICD-10-CM | POA: Diagnosis not present

## 2015-06-09 DIAGNOSIS — E890 Postprocedural hypothyroidism: Secondary | ICD-10-CM | POA: Diagnosis not present

## 2015-06-09 DIAGNOSIS — E785 Hyperlipidemia, unspecified: Secondary | ICD-10-CM

## 2015-06-09 DIAGNOSIS — D631 Anemia in chronic kidney disease: Secondary | ICD-10-CM

## 2015-06-09 DIAGNOSIS — N184 Chronic kidney disease, stage 4 (severe): Secondary | ICD-10-CM | POA: Diagnosis not present

## 2015-06-09 DIAGNOSIS — I6350 Cerebral infarction due to unspecified occlusion or stenosis of unspecified cerebral artery: Secondary | ICD-10-CM

## 2015-06-09 DIAGNOSIS — I10 Essential (primary) hypertension: Secondary | ICD-10-CM

## 2015-06-09 DIAGNOSIS — I639 Cerebral infarction, unspecified: Secondary | ICD-10-CM | POA: Diagnosis not present

## 2015-06-09 DIAGNOSIS — IMO0002 Reserved for concepts with insufficient information to code with codable children: Secondary | ICD-10-CM

## 2015-06-09 DIAGNOSIS — E559 Vitamin D deficiency, unspecified: Secondary | ICD-10-CM

## 2015-06-09 DIAGNOSIS — F0631 Mood disorder due to known physiological condition with depressive features: Secondary | ICD-10-CM | POA: Insufficient documentation

## 2015-06-09 DIAGNOSIS — N189 Chronic kidney disease, unspecified: Secondary | ICD-10-CM

## 2015-06-09 DIAGNOSIS — I69398 Other sequelae of cerebral infarction: Secondary | ICD-10-CM

## 2015-06-09 NOTE — Patient Instructions (Signed)
We'll see what today's labs show and notify you and the kidney doctor of those results Try to limit eggs to no more than three per week Try to spend 15-20 minutes outdoors, weather permitting, during comfortable times of day (don't sit out at 1:00 pm when it's 92 degrees outside, obviously) If weather doesn't permit time out-of-doors, try to spend time near a window to get natural sunlight Call with any problems before your next appointment

## 2015-06-09 NOTE — Assessment & Plan Note (Signed)
Well controlled today.

## 2015-06-09 NOTE — Assessment & Plan Note (Addendum)
Check uric acid today, avoid purine-rich foods

## 2015-06-09 NOTE — Assessment & Plan Note (Signed)
No new symptoms; continue blood thinner, statin

## 2015-06-09 NOTE — Assessment & Plan Note (Signed)
Check creatinine and GFR today and forward results to nephrologist

## 2015-06-09 NOTE — Progress Notes (Signed)
BP 127/69 mmHg  Pulse 81  Temp(Src) 98.4 F (36.9 C)  Ht 6\' 3"  (1.905 m)  Wt 209 lb (94.802 kg)  BMI 26.12 kg/m2  SpO2 99%   Subjective:    Patient ID: Duane [name], male    DOB: [birth year], 64 y.o.   MRN: [id number]  HPI: Duane [name] is a 64 y.o. male  Chief Complaint  Patient presents with  . Hypertension  . Hyperlipidemia  . Hypothyroidism   He is here for f/u He has seen a doctor for his kidneys; they want to get labs today and have those sent over to their office; visit is Sept 8th Dr. Jefm Bryant stopped the colchicine because of kidney function High blood pressure; well-controlled today; they checked it at home for a while, not checking regularly now; he was never a salt eater until all of this and picks up a little on his veggies, couple of sprinkles Hypothyroidism; gaining weight, bowels are always watery, dirty color; no blood or mucous; usually based on what he eats; popcorn and peanuts make it watery; not soft; takes imodium and keeps it a flowing thickness, like ketchup; they were trying to find someone to find someone to reverse it; energy level is a little down  Relevant past medical, surgical, family and social history reviewed and updated as indicated. Interim medical history since our last visit reviewed. Allergies and medications reviewed and updated.  Review of Systems  Constitutional: Positive for unexpected weight change.  HENT: Negative for sore throat.        No blepharospasm  Respiratory: Negative for shortness of breath.   Cardiovascular: Negative for chest pain, palpitations and leg swelling.  Gastrointestinal: Negative for blood in stool.  Genitourinary: Negative for hematuria.  Musculoskeletal:       No gout flares over last few months  Neurological: Negative for tremors.  Hematological: Does not bruise/bleed easily.  Per HPI unless specifically indicated above     Objective:    BP 127/69 mmHg  Pulse 81  Temp(Src) 98.4  F (36.9 C)  Ht 6\' 3"  (1.905 m)  Wt 209 lb (94.802 kg)  BMI 26.12 kg/m2  SpO2 99%  Wt Readings from Last 3 Encounters:  06/09/15 209 lb (94.802 kg)  02/09/15 188 lb (85.276 kg)    Physical Exam  Constitutional: He appears well-developed and well-nourished. No distress.  Weight gain of 21 pounds noted over the last four months  HENT:  Head: Normocephalic and atraumatic.  Eyes: EOM are normal. No scleral icterus.  Neck: No thyromegaly present.  Cardiovascular: Normal rate and regular rhythm.   Pulmonary/Chest: Effort normal and breath sounds normal.  Abdominal: He exhibits no distension.  Musculoskeletal: He exhibits no edema.  Slouched posture, but not true thoracic kyphosis  Neurological: Coordination normal.  Skin: Skin is warm and dry. No bruising and no ecchymosis noted. No pallor.  Psychiatric: His speech is normal. Thought content normal. His mood appears not anxious. His affect is blunt and inappropriate. He is not agitated, not hyperactive, not slowed, not withdrawn and not combative. He does not exhibit a depressed mood.  Verbally snapped at his wife when discussing date of doctor's appt in September, but was not otherwise violent or angry    Results for orders placed or performed in visit on 06/01/15  HM COLONOSCOPY  Result Value Ref Range   HM Colonoscopy per PP       Assessment & Plan:   Problem List Items Addressed This Visit  Cardiovascular and Mediastinum   Stroke    No new symptoms; continue blood thinner, statin      Relevant Orders   Lipid Panel w/o Chol/HDL Ratio   Hypertension - Primary    Well-controlled today        Endocrine   Hypothyroidism following radioiodine therapy    Last TSH was in May and normal, but he has gained over 20 pounds over last 4 months; recheck TSH today; also, on Pacerone      Relevant Orders   TSH     Musculoskeletal and Integument   Chronic gout due to renal impairment    Check uric acid today, avoid  purine-rich foods      Relevant Orders   Uric acid     Genitourinary   Chronic kidney disease, stage IV (severe)    Check creatinine and GFR today and forward results to nephrologist      Anemia, chronic renal failure   Relevant Orders   CBC with Differential/Platelet     Other   Hyperlipidemia    Limit saturated fats; check lipids, monitor sgpt      Relevant Orders   Lipid Panel w/o Chol/HDL Ratio   Depression due to stroke    this could be exacerbated by vit D deficiency or under-replaced hypothyroidism; will check labs before adjusting medicine      Elevated liver enzymes   Relevant Orders   Comprehensive metabolic panel   Colostomy care    Appointment scheduled for patient to see surgeon for evaluation and to see if colostomy take-down might be appropriate       Other Visit Diagnoses    Vitamin D deficiency        Relevant Orders    Vit D  25 hydroxy (rtn osteoporosis monitoring)       Follow up plan: Return in about 3 months (around 09/09/2015).  An after-visit summary was printed and given to the patient at [place].  Please see the patient instructions which may contain other information and recommendations beyond what is mentioned above in the assessment and plan.  Orders Placed This Encounter  Procedures  . CBC with Differential/Platelet  . Vit D  25 hydroxy (rtn osteoporosis monitoring)  . TSH  . Lipid Panel w/o Chol/HDL Ratio  . Uric acid  . Comprehensive metabolic panel

## 2015-06-09 NOTE — Assessment & Plan Note (Signed)
Appointment scheduled for patient to see surgeon for evaluation and to see if colostomy take-down might be appropriate

## 2015-06-09 NOTE — Assessment & Plan Note (Signed)
this could be exacerbated by vit D deficiency or under-replaced hypothyroidism; will check labs before adjusting medicine

## 2015-06-09 NOTE — Assessment & Plan Note (Signed)
Limit saturated fats; check lipids, monitor sgpt

## 2015-06-09 NOTE — Assessment & Plan Note (Addendum)
Last TSH was in May and normal, but he has gained over 20 pounds over last 4 months; recheck TSH today; also, on Pacerone

## 2015-06-10 LAB — COMPREHENSIVE METABOLIC PANEL
A/G RATIO: 1.3 (ref 1.1–2.5)
ALBUMIN: 3.7 g/dL (ref 3.6–4.8)
ALK PHOS: 160 IU/L — AB (ref 39–117)
ALT: 37 IU/L (ref 0–44)
AST: 36 IU/L (ref 0–40)
BILIRUBIN TOTAL: 0.4 mg/dL (ref 0.0–1.2)
BUN / CREAT RATIO: 9 — AB (ref 10–22)
BUN: 28 mg/dL — AB (ref 8–27)
CHLORIDE: 103 mmol/L (ref 97–108)
CO2: 24 mmol/L (ref 18–29)
Calcium: 8.9 mg/dL (ref 8.6–10.2)
Creatinine, Ser: 2.97 mg/dL — ABNORMAL HIGH (ref 0.76–1.27)
GFR calc non Af Amer: 21 mL/min/{1.73_m2} — ABNORMAL LOW (ref >59)
GFR, EST AFRICAN AMERICAN: 25 mL/min/{1.73_m2} — AB (ref >59)
GLOBULIN, TOTAL: 2.8 g/dL (ref 1.5–4.5)
GLUCOSE: 99 mg/dL (ref 65–99)
Potassium: 4.9 mmol/L (ref 3.5–5.2)
SODIUM: 142 mmol/L (ref 134–144)
TOTAL PROTEIN: 6.5 g/dL (ref 6.0–8.5)

## 2015-06-10 LAB — CBC WITH DIFFERENTIAL/PLATELET
BASOS ABS: 0.1 10*3/uL (ref 0.0–0.2)
Basos: 1 %
EOS (ABSOLUTE): 0.3 10*3/uL (ref 0.0–0.4)
EOS: 4 %
HEMATOCRIT: 38 % (ref 37.5–51.0)
HEMOGLOBIN: 12.2 g/dL — AB (ref 12.6–17.7)
IMMATURE GRANS (ABS): 0 10*3/uL (ref 0.0–0.1)
IMMATURE GRANULOCYTES: 0 %
LYMPHS ABS: 1.5 10*3/uL (ref 0.7–3.1)
LYMPHS: 19 %
MCH: 31 pg (ref 26.6–33.0)
MCHC: 32.1 g/dL (ref 31.5–35.7)
MCV: 97 fL (ref 79–97)
MONOCYTES: 6 %
Monocytes Absolute: 0.4 10*3/uL (ref 0.1–0.9)
Neutrophils Absolute: 5.3 10*3/uL (ref 1.4–7.0)
Neutrophils: 70 %
Platelets: 221 10*3/uL (ref 150–379)
RBC: 3.93 x10E6/uL — AB (ref 4.14–5.80)
RDW: 15 % (ref 12.3–15.4)
WBC: 7.5 10*3/uL (ref 3.4–10.8)

## 2015-06-10 LAB — LIPID PANEL W/O CHOL/HDL RATIO
CHOLESTEROL TOTAL: 163 mg/dL (ref 100–199)
HDL: 41 mg/dL (ref >39)
LDL Calculated: 80 mg/dL (ref 0–99)
TRIGLYCERIDES: 211 mg/dL — AB (ref 0–149)
VLDL CHOLESTEROL CAL: 42 mg/dL — AB (ref 5–40)

## 2015-06-10 LAB — URIC ACID: Uric Acid: 5.5 mg/dL (ref 3.7–8.6)

## 2015-06-10 LAB — VITAMIN D 25 HYDROXY (VIT D DEFICIENCY, FRACTURES): VIT D 25 HYDROXY: 13.4 ng/mL — AB (ref 30.0–100.0)

## 2015-06-10 LAB — TSH: TSH: 3.41 u[IU]/mL (ref 0.450–4.500)

## 2015-06-12 ENCOUNTER — Telehealth: Payer: Self-pay | Admitting: Family Medicine

## 2015-06-12 DIAGNOSIS — R748 Abnormal levels of other serum enzymes: Secondary | ICD-10-CM | POA: Insufficient documentation

## 2015-06-12 NOTE — Telephone Encounter (Signed)
Amy, please contact patient's kidney doctor.  Let him (or his staff) know results: worsening creatinine and GFR, low vit D, high alk phos, uric acid. See how much of that they want to take over and manage. He's going to need vitamin D but I want to know if they want to prescribe that and follow that. Also, notify Dr. Scharlene Gloss office of labs too. I believe he manages the gout. Back to me once we know what to tell patient's wife about who is going to take care of what. ALSO--ADD-ON alk phos isoenzymes to blood in lab Thank you

## 2015-06-12 NOTE — Telephone Encounter (Signed)
Lab notified to add on. 

## 2015-06-13 NOTE — Telephone Encounter (Signed)
Left detailed message for Patty at Dr. Elwyn Lade office to review labs I sent over. That he has worsening creatinine and GFR and other abnormal labs. Advised them to call me back with their thoughts and recommendations.

## 2015-06-13 NOTE — Telephone Encounter (Signed)
Labs faxed to Dr. Jefm Bryant. Advised them to please call us back and let us know what should be done in regards to his Uric Acid. Labs faxed to Dr. Holley Raring. Advised them to please review labs and advise [place] on which lab results they are willing to manage.

## 2015-06-13 NOTE — Telephone Encounter (Signed)
Dr. Sanda Klein, I sent results and left messages for Dr. Holley Raring and Dr. Jefm Bryant, but I think I kind of worried his wife when I called her asking which kidney doctor he went to. She asked about his lab results and I told her you'd have to go over them with her, that I was only sending them to his doctors. Do you mind calling her and letting her know his lab results?

## 2015-06-13 NOTE — Telephone Encounter (Signed)
I spoke with patient's wife We talked about labs Vitamin D deficiency; may be secondary hyperparathyroidism; sent to kidney doctor and we'll see if they will replace that and follow Awaiting alk phos isoenzymes; may be bone turnover because of vit D issue and kidney disease Anemia is better LDL is 80, HDL 41, TG were nonfasting Creatinine down from 3.2 to 2.97, better If she hasn't heard from me or kidney doctor about the vit D issue by Friday or Monday, call us back

## 2015-06-14 LAB — ALKALINE PHOSPHATASE, ISOENZYMES
BONE FRACTION: 17 % (ref 12–68)
INTESTINAL FRAC.: 0 % (ref 0–18)
LIVER FRACTION: 83 % (ref 13–88)

## 2015-06-14 LAB — SPECIMEN STATUS REPORT

## 2015-06-15 NOTE — Addendum Note (Signed)
Addended by: Goldie Dimmer, Satira Anis on: 06/15/2015 12:55 PM   Modules accepted: Orders

## 2015-06-20 ENCOUNTER — Telehealth: Payer: Self-pay

## 2015-06-20 MED ORDER — FAMOTIDINE 20 MG PO TABS: 20.0000 mg | ORAL_TABLET | Freq: Two times a day (BID) | ORAL | Status: DC

## 2015-06-20 NOTE — Telephone Encounter (Signed)
She states his rx for Famotidine 20mg  was to take BID but only a qty of 30 pills. Needs a new rx with a qty. Of 60.

## 2015-06-27 ENCOUNTER — Ambulatory Visit
Admission: RE | Admit: 2015-06-27 | Discharge: 2015-06-27 | Disposition: A | Discharge status: Home / Self Care | Payer: No Typology Code available for payment source | Source: Ambulatory Visit | Attending: Family Medicine | Admitting: Family Medicine

## 2015-06-27 DIAGNOSIS — R748 Abnormal levels of other serum enzymes: Secondary | ICD-10-CM | POA: Diagnosis present

## 2015-07-18 ENCOUNTER — Telehealth: Payer: Self-pay | Admitting: Family Medicine

## 2015-07-18 NOTE — Telephone Encounter (Signed)
Pt's wife called and stated she left Amy a message about pt's appt with the colon doctor, they got down there and the clinic did not have the records. Pt stated this happened on the 14th, stated she has been calling ever since. Pt's wife stated they need records from [place]. She states husband needs an appointment as soon as possible. She wants pt seen by [facility] with Dr. Ronita Hipps. Please call and advise what needs to be done to make this happen. Clinic telephone # [phone] and triage nurse # [phone].

## 2015-07-19 NOTE — Telephone Encounter (Signed)
Routing to referral coordinator. Tiffany, patient's wife called in regards to his referral.

## 2015-09-04 ENCOUNTER — Other Ambulatory Visit: Payer: Self-pay | Admitting: Family Medicine

## 2015-09-04 DIAGNOSIS — R748 Abnormal levels of other serum enzymes: Secondary | ICD-10-CM

## 2015-09-04 DIAGNOSIS — E559 Vitamin D deficiency, unspecified: Secondary | ICD-10-CM

## 2015-09-04 NOTE — Telephone Encounter (Signed)
I never got the results back from patient recheck liver tests; he is supposed to have a repeat alk phos with isoenzymes; please ask him to come in today or tomorrow for recheck labs and I'll add on a few others (vitamin D) HOLD the lipitor (atorvastatin) until we get the liver tests back They cleared him for surgery in Feb I explained the alk phos was over 200 last year, came back down but not normal; let's repeat some labs (not fasting) AMY -- call Dr. Holley Raring and get the labs recently done

## 2015-09-04 NOTE — Assessment & Plan Note (Signed)
Recheck labs; had CT 2015, liver US 2016; recheck bloodwork

## 2015-09-04 NOTE — Telephone Encounter (Signed)
Labs requested. They will fax them.

## 2015-09-04 NOTE — Assessment & Plan Note (Signed)
Recheck labs; had an alk phos in 2015 over 200 then came back down; recheck

## 2015-09-04 NOTE — Telephone Encounter (Signed)
Routing to provider  

## 2015-09-06 ENCOUNTER — Other Ambulatory Visit: Payer: No Typology Code available for payment source

## 2015-09-06 DIAGNOSIS — E559 Vitamin D deficiency, unspecified: Secondary | ICD-10-CM

## 2015-09-06 DIAGNOSIS — R748 Abnormal levels of other serum enzymes: Secondary | ICD-10-CM

## 2015-09-06 NOTE — Telephone Encounter (Signed)
Patient came in for labs. 

## 2015-09-07 LAB — COMPREHENSIVE METABOLIC PANEL
A/G RATIO: 1.3 (ref 1.1–2.5)
ALBUMIN: 3.9 g/dL (ref 3.6–4.8)
ALK PHOS: 208 IU/L — AB (ref 39–117)
ALT: 38 IU/L (ref 0–44)
AST: 36 IU/L (ref 0–40)
BILIRUBIN TOTAL: 0.4 mg/dL (ref 0.0–1.2)
BUN / CREAT RATIO: 10 (ref 10–22)
BUN: 24 mg/dL (ref 8–27)
CHLORIDE: 104 mmol/L (ref 97–106)
CO2: 25 mmol/L (ref 18–29)
Calcium: 9.3 mg/dL (ref 8.6–10.2)
Creatinine, Ser: 2.46 mg/dL — ABNORMAL HIGH (ref 0.76–1.27)
GFR calc non Af Amer: 27 mL/min/{1.73_m2} — ABNORMAL LOW (ref >59)
GFR, EST AFRICAN AMERICAN: 31 mL/min/{1.73_m2} — AB (ref >59)
GLUCOSE: 59 mg/dL — AB (ref 65–99)
Globulin, Total: 2.9 g/dL (ref 1.5–4.5)
POTASSIUM: 4.6 mmol/L (ref 3.5–5.2)
Sodium: 146 mmol/L — ABNORMAL HIGH (ref 136–144)
TOTAL PROTEIN: 6.8 g/dL (ref 6.0–8.5)

## 2015-09-07 LAB — MITOCHONDRIAL/SMOOTH MUSCLE AB PNL
Mitochondrial Ab: 11.6 Units (ref 0.0–20.0)
Smooth Muscle Ab: 14 Units (ref 0–19)

## 2015-09-07 LAB — ANA W/REFLEX IF POSITIVE: Anti Nuclear Antibody(ANA): NEGATIVE

## 2015-09-07 LAB — GAMMA GT: GGT: 36 IU/L (ref 0–65)

## 2015-09-07 LAB — VITAMIN D 25 HYDROXY (VIT D DEFICIENCY, FRACTURES): Vit D, 25-Hydroxy: 18.5 ng/mL — ABNORMAL LOW (ref 30.0–100.0)

## 2015-09-08 LAB — ALKALINE PHOSPHATASE, ISOENZYMES
BONE FRACTION: 12 % (ref 12–68)
INTESTINAL FRAC.: 0 % (ref 0–18)
LIVER FRACTION: 88 % (ref 13–88)

## 2015-09-10 DIAGNOSIS — Z932 Ileostomy status: Secondary | ICD-10-CM | POA: Insufficient documentation

## 2015-09-12 ENCOUNTER — Telehealth: Payer: Self-pay | Admitting: Family Medicine

## 2015-09-12 DIAGNOSIS — R748 Abnormal levels of other serum enzymes: Secondary | ICD-10-CM

## 2015-09-12 MED ORDER — ATORVASTATIN CALCIUM 10 MG PO TABS: 10.0000 mg | ORAL_TABLET | Freq: Every day | ORAL | Status: DC

## 2015-09-12 NOTE — Telephone Encounter (Signed)
I spoke with wife Explained alk phos up; normal GGT She denies him having weight loss, abd pain, loss of appetite Refer to GI Will not get CT scan since he's not having any s/s of cancer, and no weight loss, and alk phos was high a year ago Decrease atorvastatin to 10 mg until cleared by GI for back to higher dose 

## 2015-09-12 NOTE — Telephone Encounter (Signed)
Alk phos is back up; other labs unremarkable; last abd CT reviewed; last abd [place] reviewed Will get abd and pelvic CT scan and refer to GI; awaiting call back or I will try again today or tomorrow before entering orders

## 2015-09-12 NOTE — Assessment & Plan Note (Signed)
Refer to GI 

## 2015-09-15 ENCOUNTER — Ambulatory Visit (INDEPENDENT_AMBULATORY_CARE_PROVIDER_SITE_OTHER): Payer: No Typology Code available for payment source | Admitting: Family Medicine

## 2015-09-15 ENCOUNTER — Encounter: Payer: Self-pay | Admitting: Family Medicine

## 2015-09-15 VITALS — BP 138/77 | HR 79 | Temp 97.2°F | Wt 217.0 lb

## 2015-09-15 DIAGNOSIS — E785 Hyperlipidemia, unspecified: Secondary | ICD-10-CM

## 2015-09-15 DIAGNOSIS — F0631 Mood disorder due to known physiological condition with depressive features: Secondary | ICD-10-CM | POA: Diagnosis not present

## 2015-09-15 DIAGNOSIS — R748 Abnormal levels of other serum enzymes: Secondary | ICD-10-CM

## 2015-09-15 DIAGNOSIS — I639 Cerebral infarction, unspecified: Secondary | ICD-10-CM

## 2015-09-15 DIAGNOSIS — N184 Chronic kidney disease, stage 4 (severe): Secondary | ICD-10-CM

## 2015-09-15 DIAGNOSIS — E890 Postprocedural hypothyroidism: Secondary | ICD-10-CM | POA: Diagnosis not present

## 2015-09-15 DIAGNOSIS — M1A30X0 Chronic gout due to renal impairment, unspecified site, without tophus (tophi): Secondary | ICD-10-CM

## 2015-09-15 DIAGNOSIS — IMO0002 Reserved for concepts with insufficient information to code with codable children: Secondary | ICD-10-CM

## 2015-09-15 DIAGNOSIS — Z433 Encounter for attention to colostomy: Secondary | ICD-10-CM

## 2015-09-15 NOTE — Patient Instructions (Signed)
Let's stay the course and have you follow-up with the GI doctor If you continue to gain weight, call me and we can recheck thyroid tests

## 2015-09-15 NOTE — Progress Notes (Signed)
BP 138/77 mmHg  Pulse 79  Temp(Src) 97.2 F (36.2 C)  Wt 217 lb (98.431 kg)  SpO2 100%  Subjective:    Patient ID: Duane [name], male    DOB: [birth year], 64 y.o.   MRN: [id number]  HPI: Duane [name] is a 64 y.o. male  Chief Complaint  Patient presents with  . Hyperlipidemia  . Hypertension  . Hypothyroidism   He has gained some more weight; the weight gain has slowed down though; not emotionally eating, not a big snacker; not an overeater; reviewed previous labs; last TSH was 3.410 in August; always has dry skin, not any worse, maybe better; no hair loss; has colostomy, not much thicker, on imodium  High cholesterol; breakfast is usually eggo waffles and juice and coffee; maybe a scrambled egg or two a few times a week; bowl of cereal; might snack for lunch; evening meals might be meat and a few veggies; occasional out to eat meal; grilled chicken; watching fat  Gout has been quiescent for a whole year  We discussed the elevated alkaline phosphatase; they saw GI this morning, and they are running more tests; reviewed old labs here and through [place] was over 200 last year, then down to 160, and now back over 200  He had low blood glucose noted on last labs; he would get jittery and shaky occasionally, skip lunch; has had it two to three times being home; nothing new, going on for years  He gets labs and CT scan Feb 2017 through [facility]  Relevant past medical, surgical, family and social history reviewed and updated as indicated. Interim medical history since our last visit reviewed. Allergies and medications reviewed and updated.  Review of Systems Per HPI unless specifically indicated above     Objective:    BP 138/77 mmHg  Pulse 79  Temp(Src) 97.2 F (36.2 C)  Wt 217 lb (98.431 kg)  SpO2 100%  Wt Readings from Last 3 Encounters:  09/15/15 217 lb (98.431 kg)  06/09/15 209 lb (94.802 kg)  02/09/15 188 lb (85.276 kg)    Physical Exam   Constitutional: He appears well-developed and well-nourished.  Weight up 8 pounds in last 3-1/2 months  HENT:  Head: Normocephalic and atraumatic.  Eyes: EOM are normal. Right eye exhibits no discharge. Left eye exhibits no discharge. No scleral icterus.  Neck: No JVD present. No thyromegaly present.  Cardiovascular: Normal rate and regular rhythm.   Pulmonary/Chest: Effort normal and breath sounds normal.  Abdominal: Soft. He exhibits no distension and no mass. There is no guarding.  Musculoskeletal: He exhibits no edema.  Neurological: He is alert.  Skin: Skin is warm. No rash noted.  Psychiatric: His speech is normal. His mood appears not anxious. He does not exhibit a depressed mood.  A little short in verbal response to wife; fair eye contact with examiner; cooperative   Results for orders placed or performed in visit on 09/06/15  Alkaline phosphatase, isoenzymes  Result Value Ref Range   LIVER FRACTION 88 13 - 88 %   BONE FRACTION 12 12 - 68 %   INTESTINAL FRAC. 0 0 - 18 %  Gamma GT  Result Value Ref Range   GGT 36 0 - 65 IU/L  Comprehensive metabolic panel  Result Value Ref Range   Glucose 59 (L) 65 - 99 mg/dL   BUN 24 8 - 27 mg/dL   Creatinine, Ser 2.46 (H) 0.76 - 1.27 mg/dL   GFR calc non Af Amer 27 (  L) >59 mL/min/1.73   GFR calc Af Amer 31 (L) >59 mL/min/1.73   BUN/Creatinine Ratio 10 10 - 22   Sodium 146 (H) 136 - 144 mmol/L   Potassium 4.6 3.5 - 5.2 mmol/L   Chloride 104 97 - 106 mmol/L   CO2 25 18 - 29 mmol/L   Calcium 9.3 8.6 - 10.2 mg/dL   Total Protein 6.8 6.0 - 8.5 g/dL   Albumin 3.9 3.6 - 4.8 g/dL   Globulin, Total 2.9 1.5 - 4.5 g/dL   Albumin/Globulin Ratio 1.3 1.1 - 2.5   Bilirubin Total 0.4 0.0 - 1.2 mg/dL   Alkaline Phosphatase 208 (H) 39 - 117 IU/L   AST 36 0 - 40 IU/L   ALT 38 0 - 44 IU/L  ANA w/Reflex if Positive  Result Value Ref Range   Anit Nuclear Antibody(ANA) Negative Negative  Mitochondrial/smooth muscle ab pnl  Result Value Ref Range    Smooth Muscle Ab 14 0 - 19 Units   Mitochondrial Ab 11.6 0.0 - 20.0 Units  VITAMIN D 25 Hydroxy (Vit-D Deficiency, Fractures)  Result Value Ref Range   Vit D, 25-Hydroxy 18.5 (L) 30.0 - 100.0 ng/mL      Assessment & Plan:   Problem List Items Addressed This Visit      Endocrine   Hypothyroidism following radioiodine therapy    Last TSH in August was normal; however, with ongoing weight gain and dry skin, I explained that we should recheck this soon, even in a few months or with next labs if continuing; they agree        Musculoskeletal and Integument   Chronic gout due to renal impairment    He had been seen by rheumatologist, and also followed by nephrologist; so glad he has been free of flare for almost a year; avoid triggering agents        Genitourinary   Chronic kidney disease, stage IV (severe) ([place])    He continues to follow with nephrologist; avoid nephrotoxic agents, avoid NSAIDs, stay hydrated; renally dose all medicines        Other   Hyperlipidemia    Lower dose statin for now while getting liver evaluated, but don't want to stop comletely if it can be helped; limit saturated fats as much as possible, get more fiber and veggies      Depression due to stroke [facility])    Noted on today's interaction with me and wife; she has talked to neurologist about his changed behavior; supportive listening given to her on the phone the other day, encouragement given today      Colostomy care [facility])    Patient is hoping to have colostomy take-down in January      Alkaline phosphatase elevation - Primary    So appreciate the assistance of gastroenterology colleagues; he is undergoing further testing; my initial tests did not support PBC, but they are doing other work-up         Follow up plan: No Follow-up on file.  An after-visit summary was printed and given to the patient at [place].  Please see the patient instructions which may contain other information and  recommendations beyond what is mentioned above in the assessment and plan.  Meds ordered this encounter  Medications  . Vitamin D, Ergocalciferol, (DRISDOL) 50000 UNITS CAPS capsule    Sig: Take by mouth.  (frequency left off during med rec, will have to try to clarify with pharmacy later)  Face-to-face time with patient was more than  25 minutes, >50% time spent counseling and coordination of care

## 2015-09-19 NOTE — Assessment & Plan Note (Signed)
Noted on today's interaction with me and wife; she has talked to neurologist about his changed behavior; supportive listening given to her on the phone the other day, encouragement given today

## 2015-09-19 NOTE — Assessment & Plan Note (Signed)
He had been seen by rheumatologist, and also followed by nephrologist; so glad he has been free of flare for almost a year; avoid triggering agents

## 2015-09-19 NOTE — Assessment & Plan Note (Signed)
So appreciate the assistance of gastroenterology colleagues; he is undergoing further testing; my initial tests did not support PBC, but they are doing other work-up

## 2015-09-19 NOTE — Assessment & Plan Note (Signed)
He continues to follow with nephrologist; avoid nephrotoxic agents, avoid NSAIDs, stay hydrated; renally dose all medicines

## 2015-09-19 NOTE — Assessment & Plan Note (Signed)
Lower dose statin for now while getting liver evaluated, but don't want to stop comletely if it can be helped; limit saturated fats as much as possible, get more fiber and veggies

## 2015-09-19 NOTE — Assessment & Plan Note (Signed)
Patient is hoping to have colostomy take-down in January

## 2015-09-19 NOTE — Assessment & Plan Note (Signed)
Last TSH in August was normal; however, with ongoing weight gain and dry skin, I explained that we should recheck this soon, even in a few months or with next labs if continuing; they agree

## 2015-11-02 ENCOUNTER — Other Ambulatory Visit: Payer: Self-pay | Admitting: Family Medicine

## 2015-11-03 NOTE — Telephone Encounter (Signed)
GI note reviewed; last labs there reviewed; okay for statin

## 2015-11-27 DIAGNOSIS — K635 Polyp of colon: Secondary | ICD-10-CM

## 2015-11-27 HISTORY — PX: ENTEROSTOMY CLOSURE: SHX935

## 2015-11-27 HISTORY — DX: Polyp of colon: K63.5

## 2015-11-27 HISTORY — PX: EXPLORATORY LAPAROTOMY: SUR591

## 2015-12-14 ENCOUNTER — Telehealth: Payer: Self-pay

## 2015-12-14 NOTE — Telephone Encounter (Signed)
I spoke with wife; he had a leak, was intubated, in ICU, now out and doing better, still in the hospital; will go to [facility] after current hospital stay; he was admitted Nov 27, 2015; I thanked her for updating me

## 2015-12-14 NOTE — Telephone Encounter (Signed)
Patient's wife called and stated that he finally had surgery on Feb 13th and had lots of complications. He is finally doing better now but is still at [facility] and will probably go to rehab after that. She just wanted to let you know.

## 2015-12-15 DIAGNOSIS — E440 Moderate protein-calorie malnutrition: Secondary | ICD-10-CM | POA: Insufficient documentation

## 2016-01-05 ENCOUNTER — Telehealth: Payer: Self-pay

## 2016-01-05 NOTE — Telephone Encounter (Signed)
Sorry about fragmented note below; lost computer access ------------------ That's fine; we'll just need diagnosis codes from hospital to justify reasons for therapies Okay to send order today and I'll co-sign on Monday

## 2016-01-05 NOTE — Telephone Encounter (Signed)
He is being discharged from rehab and needs orders sent to [facility] for SN and PT. Fax # [phone]

## 2016-01-05 NOTE — Telephone Encounter (Signed)
That's fine; we'll need diagnosis codes from hospital to [facility]

## 2016-01-08 ENCOUNTER — Telehealth: Payer: Self-pay

## 2016-01-08 NOTE — Telephone Encounter (Signed)
Routing to provider for verbal ok.  

## 2016-01-08 NOTE — Telephone Encounter (Signed)
Verbal ok given to [place], PT.

## 2016-01-08 NOTE — Telephone Encounter (Signed)
Zadie Rhine is a physical therapist with [facility] requesting your approval to administer PT to Mr. Weiand 2 times a week for 4 weeks. Caller will be seeing the patient today. Please call [phone] to advise if PT is approved starting today.

## 2016-01-08 NOTE — Telephone Encounter (Signed)
Yes, okay.

## 2016-01-08 NOTE — Telephone Encounter (Signed)
Verbal order given to [place] at [place].

## 2016-01-09 ENCOUNTER — Ambulatory Visit: Payer: No Typology Code available for payment source | Admitting: Unknown Physician Specialty

## 2016-01-10 ENCOUNTER — Ambulatory Visit (INDEPENDENT_AMBULATORY_CARE_PROVIDER_SITE_OTHER): Payer: BLUE CROSS/BLUE SHIELD | Admitting: Family Medicine

## 2016-01-10 VITALS — BP 117/68 | HR 74 | Temp 97.6°F | Ht 75.0 in | Wt 211.0 lb

## 2016-01-10 DIAGNOSIS — IMO0002 Reserved for concepts with insufficient information to code with codable children: Secondary | ICD-10-CM

## 2016-01-10 DIAGNOSIS — Z23 Encounter for immunization: Secondary | ICD-10-CM

## 2016-01-10 DIAGNOSIS — T148 Other injury of unspecified body region: Secondary | ICD-10-CM | POA: Diagnosis not present

## 2016-01-10 DIAGNOSIS — I639 Cerebral infarction, unspecified: Secondary | ICD-10-CM | POA: Diagnosis not present

## 2016-01-10 DIAGNOSIS — F0631 Mood disorder due to known physiological condition with depressive features: Secondary | ICD-10-CM | POA: Diagnosis not present

## 2016-01-10 DIAGNOSIS — S31109D Unspecified open wound of abdominal wall, unspecified quadrant without penetration into peritoneal cavity, subsequent encounter: Secondary | ICD-10-CM

## 2016-01-10 DIAGNOSIS — T148XXA Other injury of unspecified body region, initial encounter: Secondary | ICD-10-CM

## 2016-01-10 MED ORDER — LEVOTHYROXINE SODIUM 100 MCG PO TABS: 100.0000 ug | ORAL_TABLET | Freq: Every day | ORAL | Status: DC

## 2016-01-10 MED ORDER — METHYLPHENIDATE HCL 5 MG PO TABS: 5.0000 mg | ORAL_TABLET | Freq: Every day | ORAL | Status: DC

## 2016-01-10 MED ORDER — FLUOXETINE HCL 20 MG PO CAPS: 20.0000 mg | ORAL_CAPSULE | Freq: Every day | ORAL | Status: DC

## 2016-01-10 MED ORDER — DOXAZOSIN MESYLATE 2 MG PO TABS: 2.0000 mg | ORAL_TABLET | Freq: Every day | ORAL | Status: DC

## 2016-01-10 MED ORDER — ATORVASTATIN CALCIUM 10 MG PO TABS: 10.0000 mg | ORAL_TABLET | Freq: Every day | ORAL | Status: DC

## 2016-01-10 MED ORDER — MIRTAZAPINE 15 MG PO TABS: 15.0000 mg | ORAL_TABLET | Freq: Every day | ORAL | Status: DC

## 2016-01-10 MED ORDER — FAMOTIDINE 20 MG PO TABS: 20.0000 mg | ORAL_TABLET | Freq: Two times a day (BID) | ORAL | Status: DC

## 2016-01-10 NOTE — Patient Instructions (Addendum)
Duane [name] --> please give patient and wife a copy of the letter and new contact info Check TSH (thyroid function) about 6-8 weeks after the dose was changed from 88 to 100 mcg daily Continue the iron Request copies of the labs from [facility] You received the vaccine to protect against tetanus and diphtheria and pertussis today; the tetanus and diphtheria portions will provide protection up to ten years, and the pertussis component will give you protection against whooping cough for life Keep the wounds clean and dry and follow-up with your surgeon with any problems Do see Dr. Holley Raring on Monday Keep your appointment with your neurologist; I can refill the Ritalin until then, but will hope that he/she can take over that prescription when you g Dr. Ronita Hipps is a dentist recommendation, recommended by Duane Haddock, FNP

## 2016-01-10 NOTE — Progress Notes (Signed)
BP 117/68 mmHg  Pulse 74  Temp(Src) 97.6 F (36.4 C)  Ht 6\' 3"  (1.905 m)  Wt 211 lb (95.709 kg)  BMI 26.37 kg/m2  SpO2 98%   Subjective:    Patient ID: Duane [name], male    DOB: [birth year], 65 y.o.   MRN: [id number]  HPI: Duane [name] is a 65 y.o. male  Chief Complaint  Patient presents with  . Follow-up    he was recently discharged from rehab.   Patient is here with his wife Just got out of [facility] on Friday after lengthy hospital stay and rehab stay after ileostomy take-down, at [facility] Did fine through the surgery, spent night down in recovery room, then there about two hours and they gave him something to bring down his pain level, opiate?, then oxygen level would not come back up fast enough; about ten people in the room; they did an echo, they sent him to surgical ICU from Feb 14th for two weeks; they tried the mask and started him on antibiotics; then intubated for two days; let him rest for a few days and pumped his lungs back up; no cough now and no fevers; they got his kidney function back after taking a hit; they got the pneumonia and renal failure straight, but then the next week, then belly got distended and hard; he had a leak; they shut his PO down for 8 days, started him on TPN; another CT scan 8 days later showed it healed and he did not have to go back to the OR; they are letting an area heal by secondary intention; wife is packing that; at the very bottom is a tiny place; the ostomy has closed up; BMs are 2-3 x a day, always runny; records briefly reviewed  Patient scraped the back of the left hand against metal frame of bed six days ago; scraped it and can't remember last tetanus No red streaks going up the arm; no fevers  Relevant past medical, surgical, family and social history reviewed and updated as indicated Past Medical History  Diagnosis Date  . Stroke ([place]) 02/20/14    left side hemiparesis, dysphagia  . Hypertension   . Gout   .  Renal insufficiency   . CKD (chronic kidney disease)     creatinine baseline 1.3-1.5 in 2015  . Hyperlipidemia     requiring life long statin therapy  . Hypothyroidism   . Graves disease   . H/O hyperkalemia     with Bactrim  . UTI (urinary tract infection) due to Enterococcus May 2015  . Restrictive lung disease     mild  . A-fib [facility])     requiring life-long anticouagulation  . Cerebellar infarct ([place])   . Left hemiparesis [facility]) May 2015    s/p strke  . Vitamin D deficiency disease    Past Surgical History  Procedure Laterality Date  . Polypectomy  10/15    GI surgery to remove polyp  . Illeostomy  07/2014  . Exploratory laparotomy  11/27/15  . Enterostomy closure  11/27/15    ileostomy takedown   Family History  Problem Relation Age of Onset  . Arthritis Mother   . Heart attack Father   . Heart disease Father    Social History  Substance Use Topics  . Smoking status: Never Smoker   . Smokeless tobacco: Never Used  . Alcohol Use: No   Interim medical history since our last visit reviewed. Allergies and medications reviewed and  updated.  Review of Systems Per HPI unless specifically indicated above     Objective:    BP 117/68 mmHg  Pulse 74  Temp(Src) 97.6 F (36.4 C)  Ht 6\' 3"  (1.905 m)  Wt 211 lb (95.709 kg)  BMI 26.37 kg/m2  SpO2 98%  Wt Readings from Last 3 Encounters:  01/10/16 211 lb (95.709 kg)  09/15/15 217 lb (98.431 kg)  06/09/15 209 lb (94.802 kg)    Physical Exam  Constitutional: He appears well-developed and well-nourished.  Tall, NAD but appears chronically ill  Cardiovascular: Normal rate and regular rhythm.   Pulmonary/Chest: Effort normal and breath sounds normal.  Abdominal: Bowel sounds are normal. He exhibits no distension.  Abdominal wounds, healing by secondary intention; no purulence, no foul drainage  Skin: Skin is warm and dry. Abrasion (dorsum of left hand, open flap wound with bruising) noted.      Assessment & Plan:    Problem List Items Addressed This Visit      Other   Depression due to stroke [facility])    Neurologist has patient on Ritalin; [place] county; he needs a refill; I have no worries about misuse or diversion so I am glad to provide a refill of this until he can get back in with neurologist      Relevant Medications   mirtazapine (REMERON) 15 MG tablet   FLUoxetine (PROZAC) 20 MG capsule   Open abdominal wall wound - Primary    Healing by secondary intention following colostomy take-down; wife doing dressing changes; does not appear infected       Other Visit Diagnoses    Need for Tdap vaccination        Relevant Orders    Tdap vaccine greater than or equal to 7yo IM (Completed)    Abrasion of skin        with open wound; tetanus given today; wound redressed, covered; monitor closely for s/s of infection       Follow up plan: Return in about 6 weeks (around 02/21/2016) for labs and follow-up with provider of choice.  An after-visit summary was printed and given to the patient at [place].  Please see the patient instructions which may contain other information and recommendations beyond what is mentioned above in the assessment and plan.  Meds ordered this encounter  Medications  . DISCONTD: doxazosin (CARDURA) 2 MG tablet    Sig: Take 2 mg by mouth at bedtime.  Marland Kitchen DISCONTD: methylphenidate (RITALIN) 5 MG tablet    Sig: Take 5 mg by mouth daily.  . ferrous sulfate 325 (65 FE) MG tablet    Sig: Take 325 mg by mouth daily with breakfast.  . Melatonin 3 MG TABS    Sig: Take 3 mg by mouth at bedtime.  Marland Kitchen levothyroxine (SYNTHROID, LEVOTHROID) 100 MCG tablet    Sig: Take 1 tablet (100 mcg total) by mouth daily.    Dispense:  30 tablet    Refill:  1  . famotidine (PEPCID) 20 MG tablet    Sig: Take 1 tablet (20 mg total) by mouth 2 (two) times daily.    Dispense:  60 tablet    Refill:  12  . mirtazapine (REMERON) 15 MG tablet    Sig: Take 1 tablet (15 mg total) by  mouth at bedtime.    Dispense:  30 tablet    Refill:  3  . FLUoxetine (PROZAC) 20 MG capsule    Sig: Take 1 capsule (20 mg total)  by mouth daily.    Dispense:  30 capsule    Refill:  6  . atorvastatin (LIPITOR) 10 MG tablet    Sig: Take 1 tablet (10 mg total) by mouth at bedtime.    Dispense:  30 tablet    Refill:  0  . doxazosin (CARDURA) 2 MG tablet    Sig: Take 1 tablet (2 mg total) by mouth at bedtime.    Dispense:  30 tablet    Refill:  6  . methylphenidate (RITALIN) 5 MG tablet    Sig: Take 1 tablet (5 mg total) by mouth daily.    Dispense:  30 tablet    Refill:  0   Face-to-face time with patient was more than 25 minutes, >50% time spent counseling and coordination of care

## 2016-01-22 ENCOUNTER — Other Ambulatory Visit: Payer: Self-pay | Admitting: Family Medicine

## 2016-01-23 MED ORDER — METOPROLOL TARTRATE 25 MG PO TABS: 25.0000 mg | ORAL_TABLET | Freq: Two times a day (BID) | ORAL | Status: DC

## 2016-01-23 NOTE — Telephone Encounter (Signed)
Pt's wife states he takes 1 tab bid

## 2016-01-23 NOTE — Telephone Encounter (Signed)
Please verify with patient's wife how he takes this; does he take 1 pill twice a day or 1.5 pills twice a day; thanks

## 2016-01-30 DIAGNOSIS — S31109A Unspecified open wound of abdominal wall, unspecified quadrant without penetration into peritoneal cavity, initial encounter: Secondary | ICD-10-CM | POA: Insufficient documentation

## 2016-01-30 NOTE — Assessment & Plan Note (Signed)
Neurologist has patient on Ritalin; specialist is in another county; he needs a refill; I have no worries about misuse or diversion so I am glad to provide a refill of this until he can get back in with neurologist

## 2016-01-30 NOTE — Assessment & Plan Note (Signed)
Healing by secondary intention following colostomy take-down; wife doing dressing changes; does not appear infected

## 2016-02-08 ENCOUNTER — Other Ambulatory Visit: Payer: Self-pay | Admitting: Family Medicine

## 2016-02-09 ENCOUNTER — Other Ambulatory Visit: Payer: Self-pay | Admitting: Family Medicine

## 2016-02-09 NOTE — Telephone Encounter (Signed)
Pt needs refill on Methylphenidate. Pt has an appt on 02/21/2016

## 2016-02-10 MED ORDER — METHYLPHENIDATE HCL 5 MG PO TABS: 5.0000 mg | ORAL_TABLET | Freq: Every day | ORAL | Status: DC

## 2016-02-10 NOTE — Telephone Encounter (Signed)
[place] site reviewed; no other prescribers Rx approved; must be picked up; please call wife

## 2016-02-12 ENCOUNTER — Telehealth: Payer: Self-pay | Admitting: Family Medicine

## 2016-02-12 MED ORDER — LEVOTHYROXINE SODIUM 88 MCG PO TABS
88.0000 ug | ORAL_TABLET | Freq: Every day | ORAL | Status: DC
Start: 2016-02-12 — End: 2016-02-13

## 2016-02-12 NOTE — Telephone Encounter (Signed)
I called wife to verify; left detailed msg; I was going by what was in med list; I will send 88 mcg, as it sounds from the message that someone else [facility]?) has changed him back to 88; please call with any questions or to clarify any confusion I also wrote note on Rx to clarify dose with wife at pharmacy

## 2016-02-12 NOTE — Telephone Encounter (Signed)
LVM to inform pt that RX is ready for pick up.

## 2016-02-12 NOTE — Telephone Encounter (Signed)
PT WIFE SAID THAT HIS THYROID MEDS 100MG  HAS BEEN CHANGED BACK TO 88MG  WHEN THEY FILLED IT THE WEEKEND. NEEDS THAT TO BE CORRECTED FOR HE IS USUALLY TAKING 100MG . PLEASE FIX OR ADVISE.

## 2016-02-12 NOTE — Telephone Encounter (Signed)
I want to make sure before I call pharmacy he is suppose to be on 100 mcg right?

## 2016-02-13 ENCOUNTER — Telehealth: Payer: Self-pay

## 2016-02-13 MED ORDER — LEVOTHYROXINE SODIUM 100 MCG PO TABS: 100.0000 ug | ORAL_TABLET | Freq: Every day | ORAL | Status: DC

## 2016-02-13 NOTE — Telephone Encounter (Signed)
I had originally Rxd 100 mcg on March 29th; yesterday, I received a phone message which intimated that it was supposed to be 88 mcg instead, so I sent new Rx; now I see it should be 100 as I originally prescribed; another Rx for the 100 mcg sent to pharmacy with note to pharmacist

## 2016-02-13 NOTE — Telephone Encounter (Signed)
Pt wife called states his thyroid medication needs to be 115mcg not 23mcg? Can u please refill for 166mcg

## 2016-02-21 ENCOUNTER — Encounter: Payer: Self-pay | Admitting: Family Medicine

## 2016-02-21 ENCOUNTER — Ambulatory Visit (INDEPENDENT_AMBULATORY_CARE_PROVIDER_SITE_OTHER): Payer: BLUE CROSS/BLUE SHIELD | Admitting: Family Medicine

## 2016-02-21 VITALS — BP 122/58 | HR 54 | Temp 98.9°F | Resp 16 | Wt 220.0 lb

## 2016-02-21 DIAGNOSIS — N184 Chronic kidney disease, stage 4 (severe): Secondary | ICD-10-CM | POA: Diagnosis not present

## 2016-02-21 DIAGNOSIS — E890 Postprocedural hypothyroidism: Secondary | ICD-10-CM

## 2016-02-21 DIAGNOSIS — E785 Hyperlipidemia, unspecified: Secondary | ICD-10-CM | POA: Diagnosis not present

## 2016-02-21 DIAGNOSIS — I480 Paroxysmal atrial fibrillation: Secondary | ICD-10-CM | POA: Diagnosis not present

## 2016-02-21 DIAGNOSIS — I638 Other cerebral infarction: Secondary | ICD-10-CM | POA: Diagnosis not present

## 2016-02-21 DIAGNOSIS — I6389 Other cerebral infarction: Secondary | ICD-10-CM

## 2016-02-21 DIAGNOSIS — D631 Anemia in chronic kidney disease: Secondary | ICD-10-CM

## 2016-02-21 DIAGNOSIS — E559 Vitamin D deficiency, unspecified: Secondary | ICD-10-CM | POA: Diagnosis not present

## 2016-02-21 NOTE — Assessment & Plan Note (Signed)
On vitamin D 50k once a month

## 2016-02-21 NOTE — Assessment & Plan Note (Signed)
Check TSH and free T4 today 

## 2016-02-21 NOTE — Assessment & Plan Note (Signed)
Recheck BMP today; let Dr. Holley Raring know (wife will call); avoid NSAIDs

## 2016-02-21 NOTE — Assessment & Plan Note (Signed)
Continue statin. 

## 2016-02-21 NOTE — Assessment & Plan Note (Signed)
Anemia managed by nephrologist

## 2016-02-21 NOTE — Progress Notes (Signed)
BP 122/58 mmHg  Pulse 54  Temp(Src) 98.9 F (37.2 C) (Oral)  Resp 16  Wt 220 lb (99.791 kg)  SpO2 98%   Subjective:    Patient ID: Duane [name], male    DOB: 04-09-1951, 65 y.o.   MRN: [id number]  HPI: Duane [name] is a 65 y.o. male  Chief Complaint  Patient presents with  . Follow-up  . Hypothyroidism   Hypothyroidism He weighed today with shoes on, phone in pocket; will reweigh on way out today Feeling cold but since starting blood thinner No constipation Appetite is pretty good; better than typical [place]  He went to [facility] yesterday to see the neurologist; he did not like that they put him on 2.5 mg eliquis and wanted him to decrease the ritalin and then stop after 3-5 days  Taking vitamin D once a month; energy level a little better than it was six months ago  Cholesterol; one egg a week; not much fatty pork foods like hot dogs or sausage; bacon not even once a week  Labs reviewed, drawn yesterday; Creatinine was 2.6, GFR was 25 yesterday; did not drink much yesterday or night before; no NSAIDs; no other new medicines; urinating normally  Depression screen [facility] 2/9 02/21/2016  Decreased Interest 0  Down, Depressed, Hopeless 0  PHQ - 2 Score 0    No flowsheet data found.  Relevant past medical, surgical, family and social history reviewed Past Medical History  Diagnosis Date  . Stroke ([place]) 02/20/14    left side hemiparesis, dysphagia  . Hypertension   . Gout   . Renal insufficiency   . CKD (chronic kidney disease)     creatinine baseline 1.3-1.5 in 2015  . Hyperlipidemia     requiring life long statin therapy  . Hypothyroidism   . Graves disease   . H/O hyperkalemia     with Bactrim  . UTI (urinary tract infection) due to Enterococcus May 2015  . Restrictive lung disease     mild  . A-fib [facility])     requiring life-long anticouagulation  . Cerebellar infarct ([place])   . Left hemiparesis [facility]) May 2015    s/p strke  . Vitamin D  deficiency disease    Past Surgical History  Procedure Laterality Date  . Polypectomy  10/15    GI surgery to remove polyp  . Illeostomy  07/2014  . Exploratory laparotomy  11/27/15  . Enterostomy closure  11/27/15    ileostomy takedown   Family History  Problem Relation Age of Onset  . Arthritis Mother   . Heart attack Father   . Heart disease Father    Social History  Substance Use Topics  . Smoking status: Never Smoker   . Smokeless tobacco: Never Used  . Alcohol Use: No    Interim medical history since last visit reviewed. Allergies and medications reviewed  Review of Systems Per HPI unless specifically indicated above     Objective:    BP 122/58 mmHg  Pulse 54  Temp(Src) 98.9 F (37.2 C) (Oral)  Resp 16  Wt 220 lb (99.791 kg)  SpO2 98%  Wt Readings from Last 3 Encounters:  02/21/16 220 lb (99.791 kg)  01/10/16 211 lb (95.709 kg)  09/15/15 217 lb (98.431 kg)    Physical Exam  Constitutional: He appears well-developed and well-nourished.  HENT:  Head: Normocephalic and atraumatic.  Eyes: No scleral icterus.  Neck: No JVD present.  Cardiovascular: Regular rhythm.  Bradycardia present.  Pulmonary/Chest: Effort normal and breath sounds normal.  Abdominal: He exhibits no distension.  Musculoskeletal: He exhibits no edema.  Neurological: He is alert.  Skin: No pallor.  Psychiatric: His mood appears not anxious. His affect is blunt. His speech is not rapid and/or pressured. He is not slowed. He expresses impulsivity. He does not exhibit a depressed mood. He expresses no homicidal and no suicidal ideation.  Fair eye contact with examiner; rather terse, but appropriate historian; not as angry or short-tempered with me or wife as in the past He is attentive.    Labs from 09/06/15 reviewed: Vitamin D 18.5 Cr 2.46 Alk phos 208 with 88% liver fraction, negative/normal ANA, ASMA, AMA, SGOT, SGPT     Assessment & Plan:   Problem List Items Addressed This Visit       Cardiovascular and Mediastinum   A-fib (HCC)    Today, sounds to be in normal sinus rhythm; continue anticoagulation      Relevant Medications   apixaban (ELIQUIS) 5 MG TABS tablet   Stroke [facility])    Improving function; just saw neurologist; now on Eliquis 5 mg BID      Relevant Medications   apixaban (ELIQUIS) 5 MG TABS tablet     Endocrine   Hypothyroidism following radioiodine therapy - Primary    Check TSH and free T4 today      Relevant Orders   T4, free (Completed)   TSH (Completed)     Genitourinary   Anemia, chronic renal failure    Anemia managed by nephrologist      Chronic kidney disease, stage IV (severe) ([place])    Recheck BMP today; let Dr. Holley Raring know (wife will call); avoid NSAIDs      Relevant Orders   Basic metabolic panel (Completed)     Other   Hyperlipidemia    Continue statin      Relevant Medications   apixaban (ELIQUIS) 5 MG TABS tablet   Vitamin D deficiency    On vitamin D 50k once a month         Follow up plan: Return in about 3 months (around 05/23/2016) for visit and fasting lipids.  An after-visit summary was printed and given to the patient at [place].  Please see the patient instructions which may contain other information and recommendations beyond what is mentioned above in the assessment and plan.  Meds ordered this encounter  Medications  . apixaban (ELIQUIS) 5 MG TABS tablet    Sig: Take 1 tablet by mouth 2 (two) times daily.    Orders Placed This Encounter  Procedures  . Basic metabolic panel  . T4, free  . TSH

## 2016-02-21 NOTE — Patient Instructions (Addendum)
Please have the kidney doctor look at his labs Creatinine:  2.6 yesterday, 2.1 on December 17, 2015 GFR: 25 yesterday, 32 on December 17, 2015 BUN: 20 yesterday, 45 on December 17, 2015 Have labs drawn today Try to limit saturated fats in your diet (bologna, hot dogs, barbeque, cheeseburgers, hamburgers, steak, bacon, sausage, cheese, etc.) and get more fresh fruits, vegetables, and whole grains

## 2016-02-21 NOTE — Assessment & Plan Note (Signed)
Improving function; just saw neurologist; now on Eliquis 5 mg BID

## 2016-02-21 NOTE — Assessment & Plan Note (Signed)
Today, sounds to be in normal sinus rhythm; continue anticoagulation

## 2016-02-22 LAB — BASIC METABOLIC PANEL
BUN/Creatinine Ratio: 8 — ABNORMAL LOW (ref 10–24)
BUN: 19 mg/dL (ref 8–27)
CO2: 22 mmol/L (ref 18–29)
Calcium: 8.6 mg/dL (ref 8.6–10.2)
Chloride: 106 mmol/L (ref 96–106)
Creatinine, Ser: 2.36 mg/dL — ABNORMAL HIGH (ref 0.76–1.27)
GFR calc Af Amer: 32 mL/min/{1.73_m2} — ABNORMAL LOW (ref >59)
GFR, EST NON AFRICAN AMERICAN: 28 mL/min/{1.73_m2} — AB (ref >59)
Glucose: 84 mg/dL (ref 65–99)
POTASSIUM: 4.8 mmol/L (ref 3.5–5.2)
SODIUM: 144 mmol/L (ref 134–144)

## 2016-02-22 LAB — T4, FREE: Free T4: 1.33 ng/dL (ref 0.82–1.77)

## 2016-02-22 LAB — TSH: TSH: 2.46 u[IU]/mL (ref 0.450–4.500)

## 2016-03-07 ENCOUNTER — Other Ambulatory Visit: Payer: Self-pay | Admitting: Family Medicine

## 2016-03-07 NOTE — Telephone Encounter (Signed)
ALT was 22 on May 9th; Rx approved

## 2016-04-27 IMAGING — CR DG CHEST 1V PORT
1 series · 1 of 1 positions shown · non-contrast
Comparison: PA and lateral chest 07/21/2014.

CLINICAL DATA: Weakness and fever. Status post hemicolectomy
07/27/2014.

EXAM:
PORTABLE CHEST - 1 VIEW

[ap]
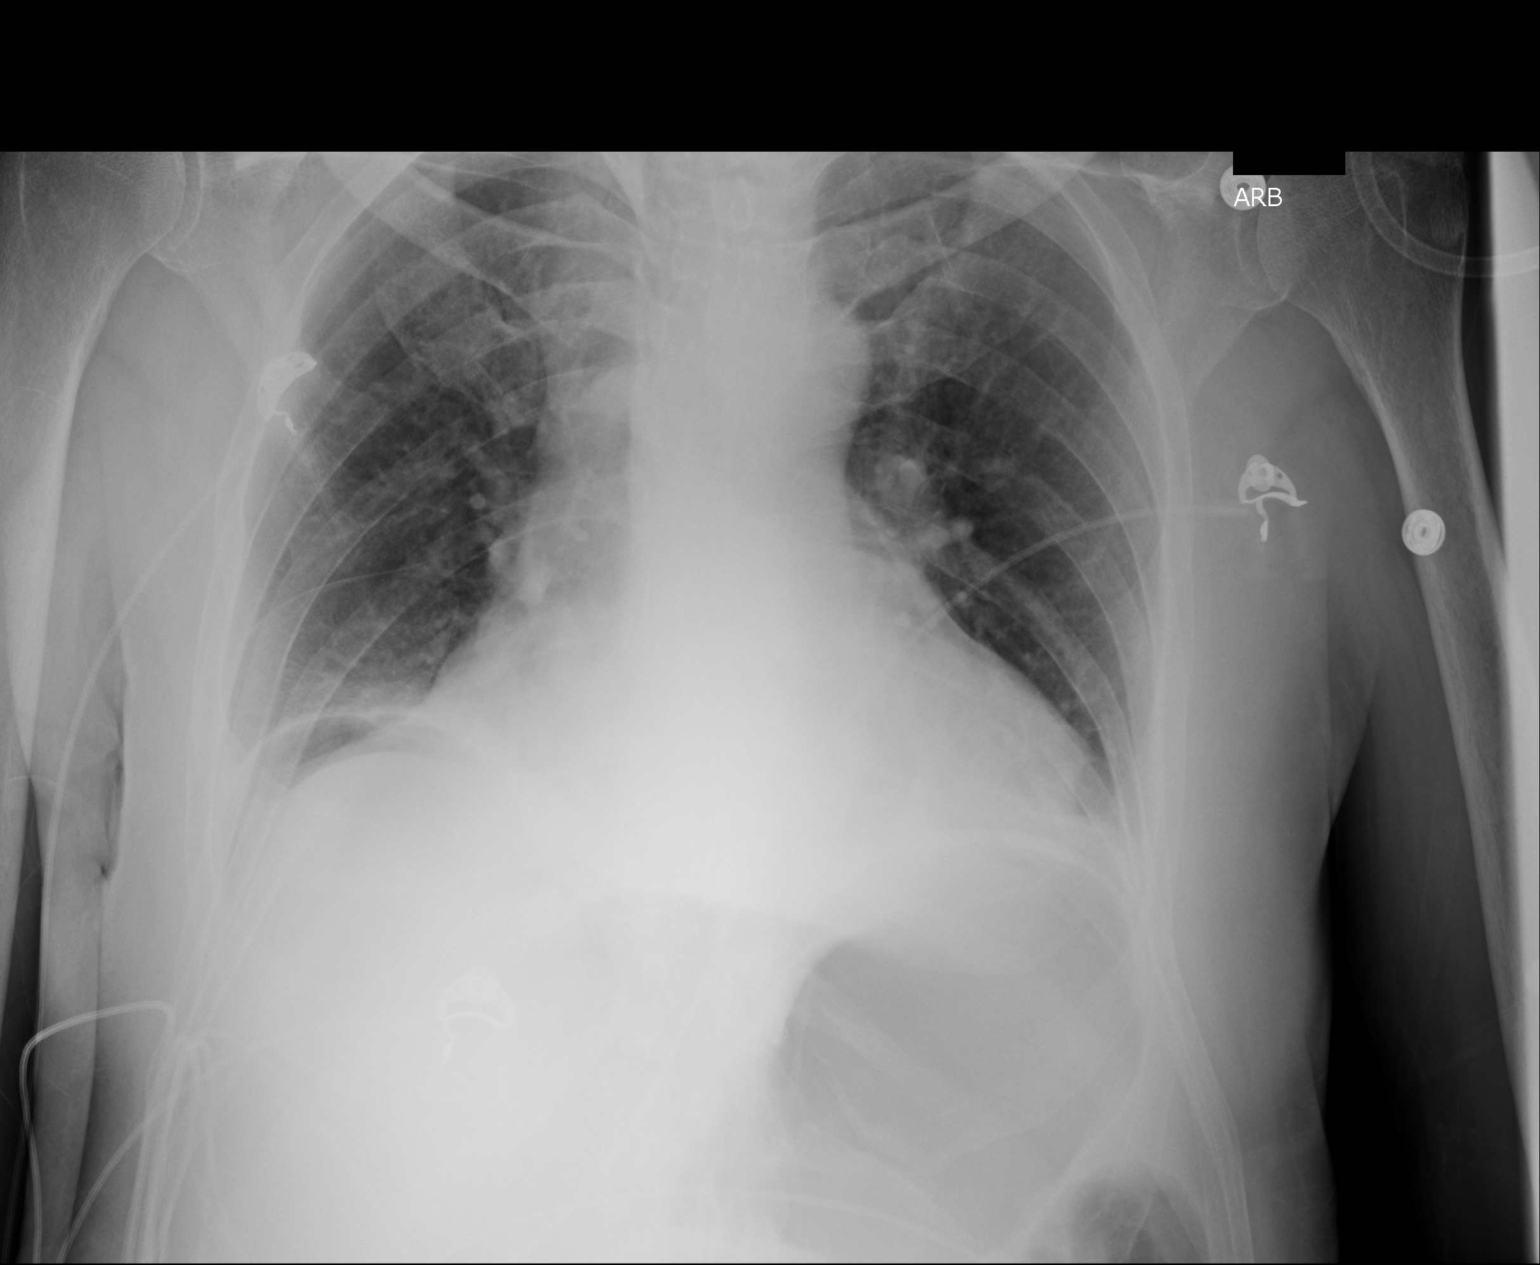

[1 of 1 positions shown; findings below may reference images not displayed]

FINDINGS: There is free intraperitoneal air. Lung volumes are low with basilar
atelectasis. Heart size is upper normal. No pneumothorax identified.
IMPRESSION: Free intraperitoneal air is likely related to hemicolectomy 3 days
ago.

Mild bibasilar atelectasis.

Critical Value/emergent results were called by telephone at the time
of interpretation on 07/30/2014 at [DATE] to Dr. Danii Aujla ,
who verbally acknowledged these results.

## 2016-04-29 IMAGING — CR DG CHEST 1V PORT
1 series · 1 of 1 positions shown · non-contrast
Comparison: 07/30/2014

CLINICAL DATA: Cough, recent abdominal surgery

EXAM:
PORTABLE CHEST - 1 VIEW

[ap]
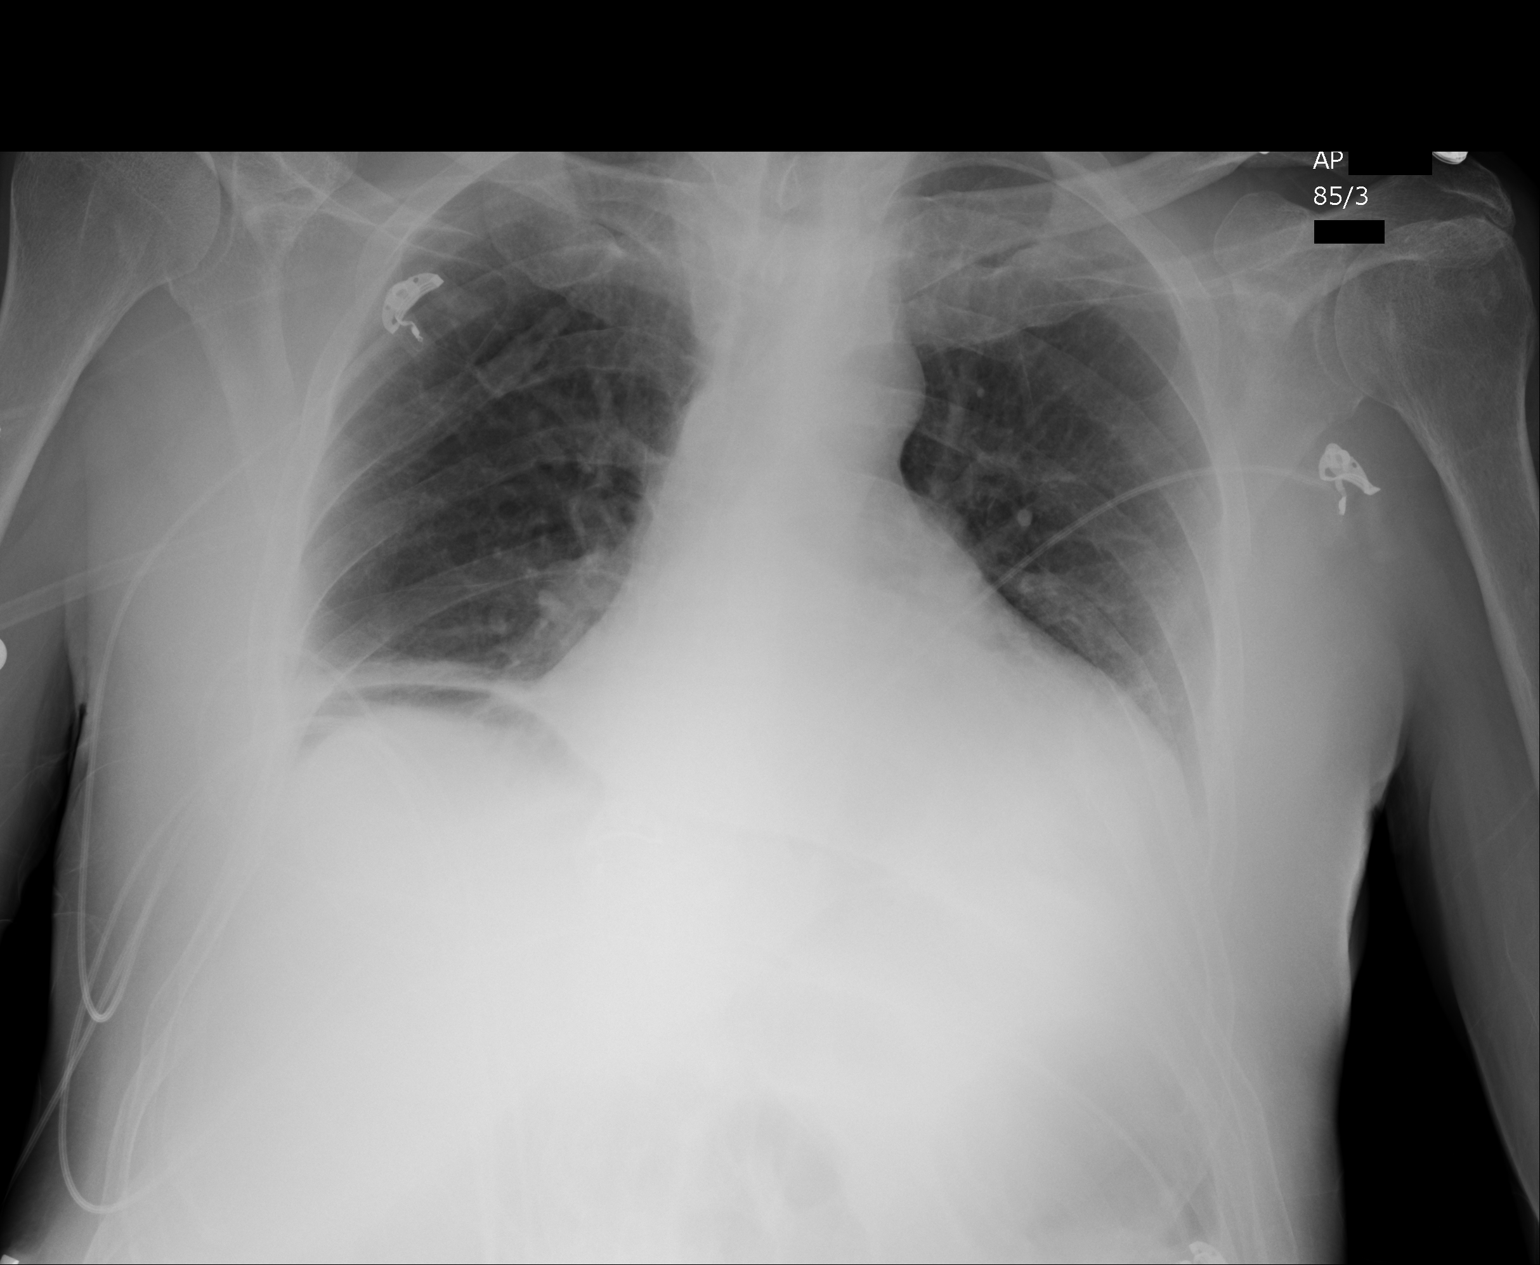

[1 of 1 positions shown; findings below may reference images not displayed]

FINDINGS: Cardiac shadow remains enlarged. Bibasilar atelectasis is again
noted and stable. Free air is noted beneath the right hemidiaphragm
consistent with the patient's recent abdominal surgery. No acute
bony abnormality is noted.
IMPRESSION: Stable bibasilar atelectasis.

Stable free air.

## 2016-04-30 IMAGING — CT CT ABD-PELV W/O CM
2 of 6 series · 16 of 46 positions shown, 18 images · non-contrast
Comparison: 06/22/2009 CT and recent chest radiographs.

CLINICAL DATA: 62-year-old male with abdominal and pelvic pain,
elevated white count, leukocytosis. Hemicolectomy on 07/27/2014.

EXAM:
CT ABDOMEN AND PELVIS WITHOUT CONTRAST
TECHNIQUE: Multidetector CT imaging of the abdomen and pelvis was performed
following the standard protocol without IV contrast.

[Series 2: routine abd pel without · axial · non-contrast · 0.81mm/px · z∈[-1008,-558]mm · 13 of 102 slices shown, 15 images]
[im 6/102  soft-tissue]
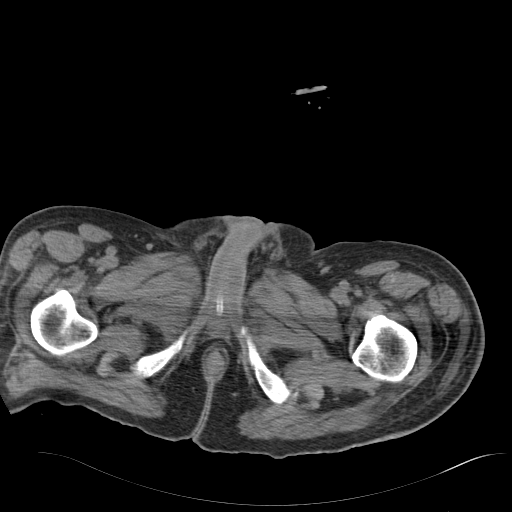
[im 6/102  bone]
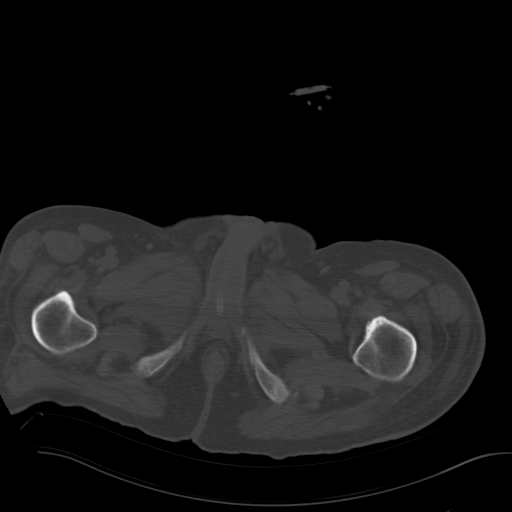
[im 12/102  soft-tissue]
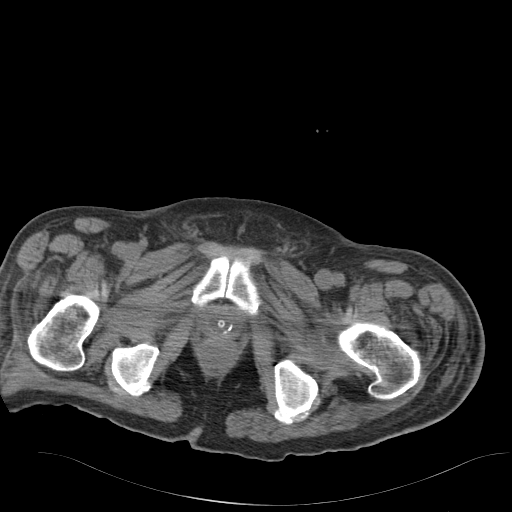
[im 23/102  soft-tissue]
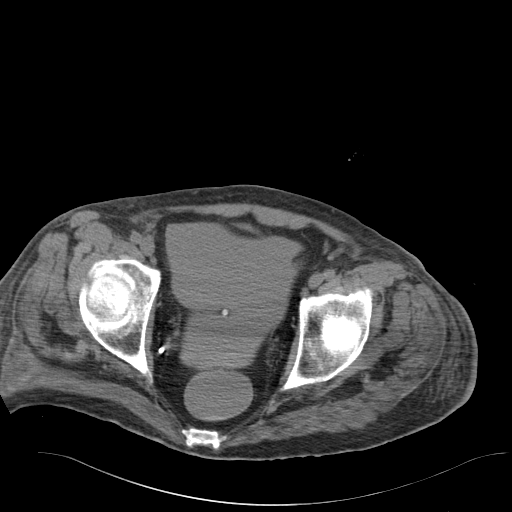
[im 29/102  soft-tissue]
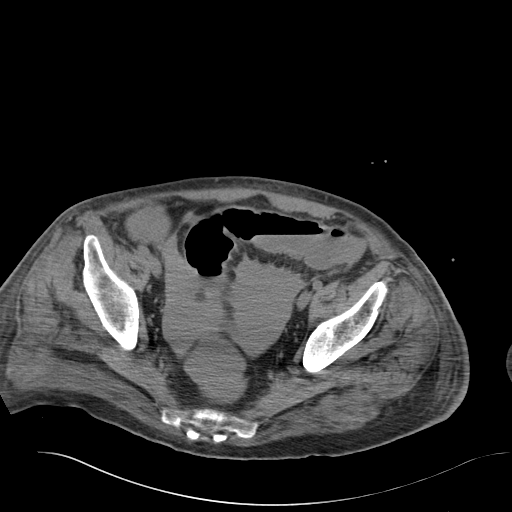
[im 34/102  soft-tissue]
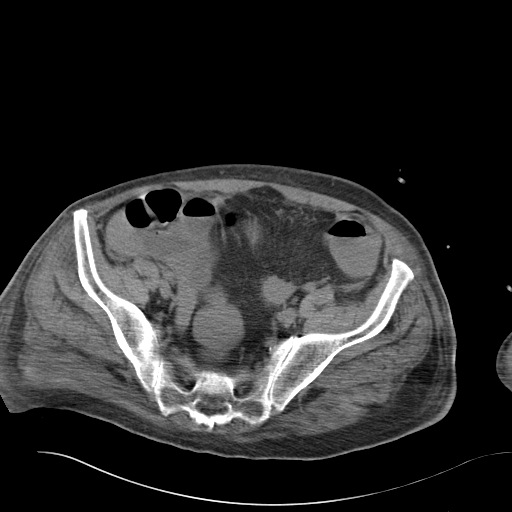
[im 45/102  soft-tissue]
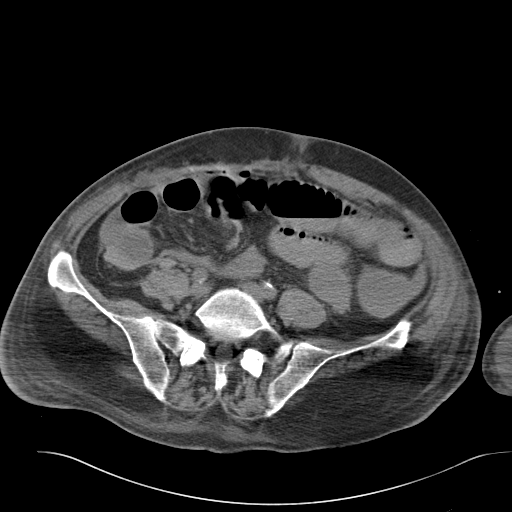
[im 51/102  soft-tissue]
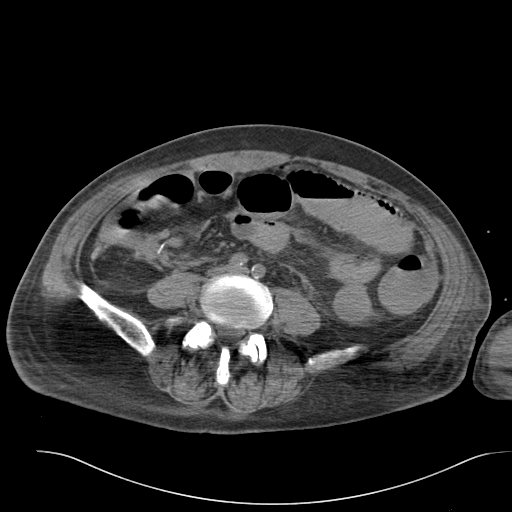
[im 57/102  soft-tissue]
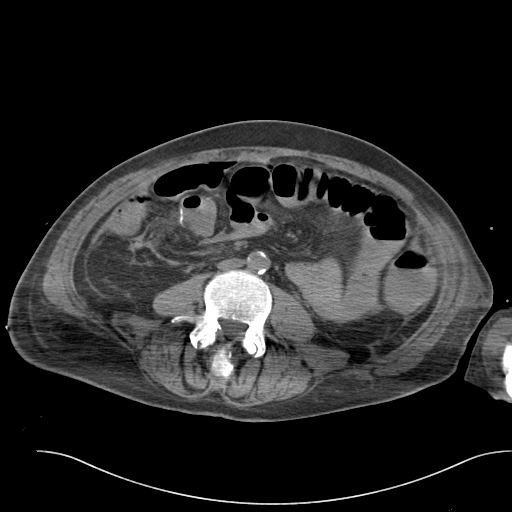
[im 68/102  soft-tissue]
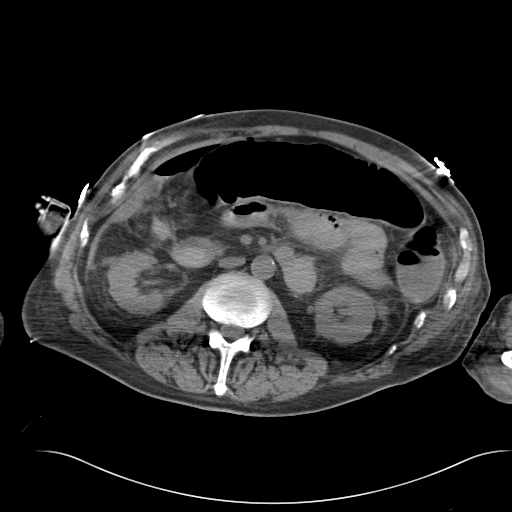
[im 68/102  bone]
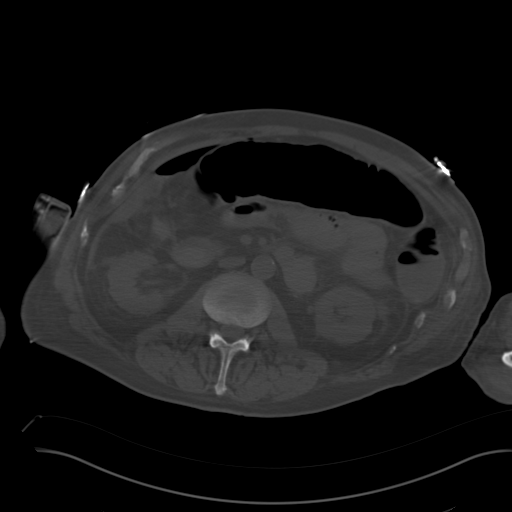
[im 73/102  soft-tissue]
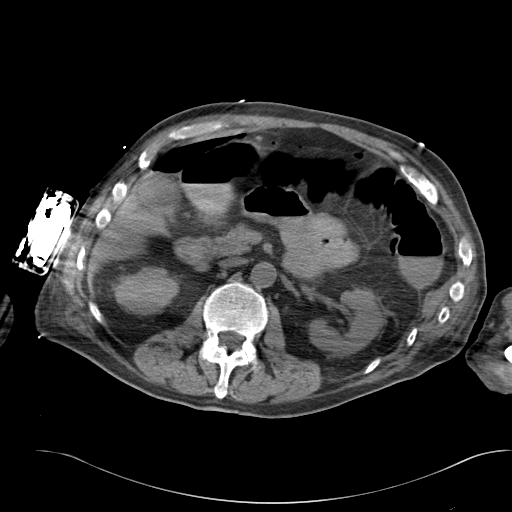
[im 79/102  soft-tissue]
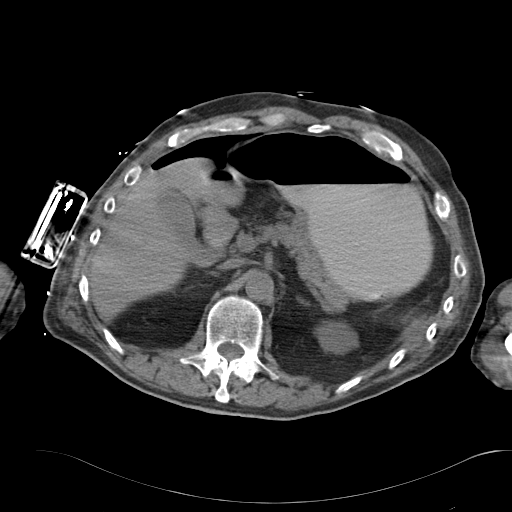
[im 90/102  soft-tissue]
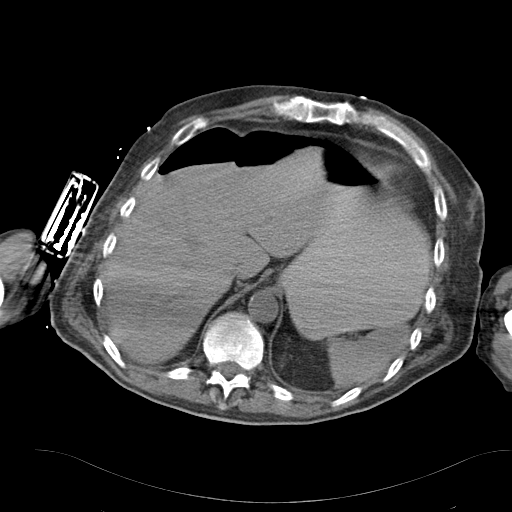
[im 96/102  soft-tissue]
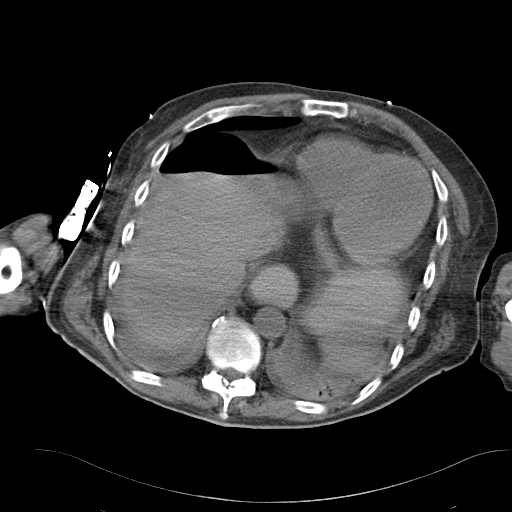

[Series 7: cor routine abd pel wo · coronal · 0.80mm/px · 3 of 149 slices shown]
[im 50/149  soft-tissue]
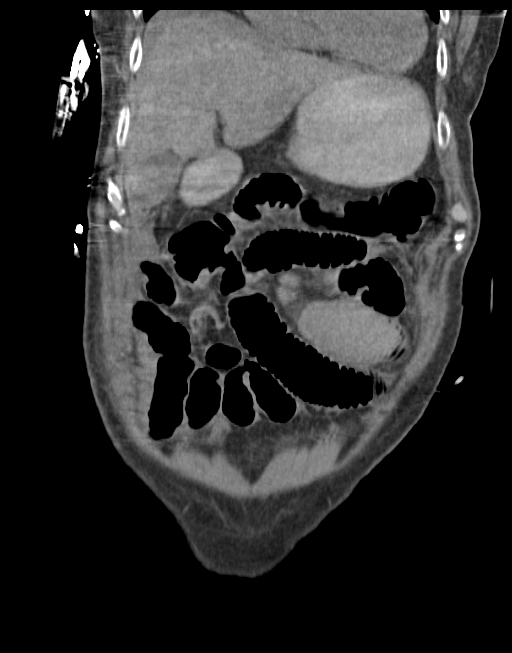
[im 66/149  soft-tissue]
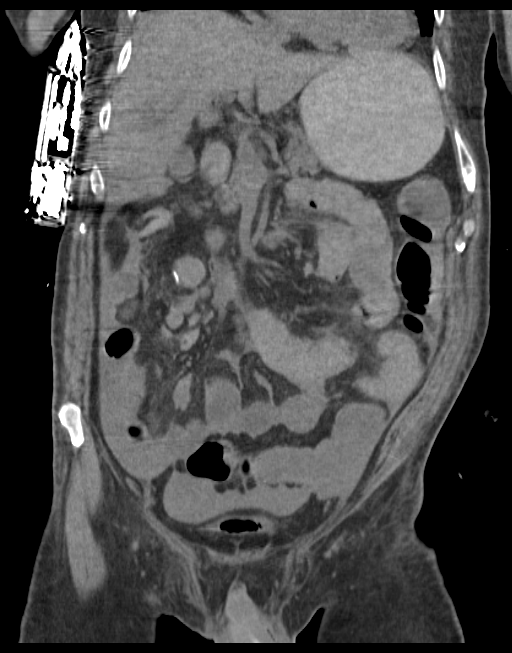
[im 83/149  soft-tissue]
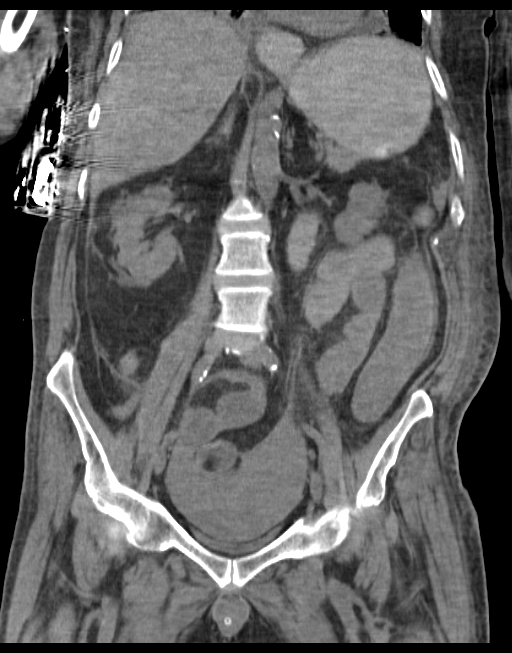

[16 of 46 positions shown; findings below may reference images not displayed]

FINDINGS: A moderate amount of pneumoperitoneum is identified with moderate to
large amount of free fluid within the pelvis. This pelvic fluid
measures 34 Hounsfield units suggesting blood or complexity. This
amount of pneumoperitoneum and free pelvic fluid is out of
proportion to expected changes from surgery 6 days ago and bowel
leak/perforation is suspected. A definite perforation/leak site is
not identified but may be at the anastomotic site.

There are distended small bowel loops present which most likely
represent an ileus.

Moderate bibasilar atelectasis/ consolidation and small bilateral
pleural effusions are noted.

The liver, pancreas, gallbladder, adrenal glands, spleen and kidneys
are unremarkable.

Please note that parenchymal abnormalities may be missed without
intravenous contrast.

There is no evidence of biliary dilatation, abdominal aortic
aneurysm or enlarged lymph nodes.

A Foley catheter within the bladder is noted.

No acute or suspicious bony abnormalities are identified.
IMPRESSION: Moderate pneumoperitoneum and large amount of non simple free pelvic
fluid suspicious for bowel leak/perforation. A definite site is not
identified but may be at the bowel anastomosis.

Distended small bowel loops likely ileus.

Moderate bilateral lower lung atelectasis/ consolidation and small
bilateral pleural effusions.

Critical Value/emergent results were called by telephone at the time
of interpretation on 08/02/2014 at [DATE] to Dr. MONCHIS
YIOTIS , who verbally acknowledged these results.

## 2016-05-06 ENCOUNTER — Other Ambulatory Visit: Payer: Self-pay | Admitting: Family Medicine

## 2016-05-20 ENCOUNTER — Other Ambulatory Visit: Payer: Self-pay | Admitting: Family Medicine

## 2016-05-20 NOTE — Telephone Encounter (Signed)
Reviewed last tsh, approved

## 2016-05-21 ENCOUNTER — Telehealth: Payer: Self-pay | Admitting: Family Medicine

## 2016-05-21 MED ORDER — FLUOXETINE HCL 40 MG PO CAPS: 40.0000 mg | ORAL_CAPSULE | Freq: Every day | ORAL | 1 refills | Status: DC

## 2016-05-21 NOTE — Telephone Encounter (Signed)
Thank you Please update med list if ritalin was stopped Let wife know that I'll increase his fluoxetine from 20 mg daily to 40 mg and we'll see if that helps Thank you

## 2016-05-21 NOTE — Telephone Encounter (Signed)
Pt wife wanted to let you know about how he has been acting.  She states has a bad attitude, depressed and doesn't want to take a shower.  Since he has been off ritalin has not been doing good.  She states throat doctor took him off, because was not suppost to be on long term.  Wants to see about getting him something else.  Patients wife did not want to say this in front of him, but wanted to give you a heads up to address at appt.

## 2016-05-22 ENCOUNTER — Other Ambulatory Visit: Payer: Self-pay

## 2016-05-22 NOTE — Telephone Encounter (Signed)
Pt has an appt thursday

## 2016-05-23 ENCOUNTER — Encounter: Payer: Self-pay | Admitting: Family Medicine

## 2016-05-23 ENCOUNTER — Ambulatory Visit (INDEPENDENT_AMBULATORY_CARE_PROVIDER_SITE_OTHER): Payer: BLUE CROSS/BLUE SHIELD | Admitting: Family Medicine

## 2016-05-23 DIAGNOSIS — E559 Vitamin D deficiency, unspecified: Secondary | ICD-10-CM

## 2016-05-23 DIAGNOSIS — M1A30X0 Chronic gout due to renal impairment, unspecified site, without tophus (tophi): Secondary | ICD-10-CM

## 2016-05-23 DIAGNOSIS — I638 Other cerebral infarction: Secondary | ICD-10-CM | POA: Diagnosis not present

## 2016-05-23 DIAGNOSIS — E785 Hyperlipidemia, unspecified: Secondary | ICD-10-CM

## 2016-05-23 DIAGNOSIS — E890 Postprocedural hypothyroidism: Secondary | ICD-10-CM

## 2016-05-23 DIAGNOSIS — R5383 Other fatigue: Secondary | ICD-10-CM | POA: Diagnosis not present

## 2016-05-23 DIAGNOSIS — I10 Essential (primary) hypertension: Secondary | ICD-10-CM

## 2016-05-23 DIAGNOSIS — D631 Anemia in chronic kidney disease: Secondary | ICD-10-CM

## 2016-05-23 DIAGNOSIS — N184 Chronic kidney disease, stage 4 (severe): Secondary | ICD-10-CM | POA: Diagnosis not present

## 2016-05-23 DIAGNOSIS — R6882 Decreased libido: Secondary | ICD-10-CM

## 2016-05-23 DIAGNOSIS — Z125 Encounter for screening for malignant neoplasm of prostate: Secondary | ICD-10-CM | POA: Diagnosis not present

## 2016-05-23 DIAGNOSIS — F0631 Mood disorder due to known physiological condition with depressive features: Secondary | ICD-10-CM

## 2016-05-23 DIAGNOSIS — R748 Abnormal levels of other serum enzymes: Secondary | ICD-10-CM

## 2016-05-23 DIAGNOSIS — IMO0002 Reserved for concepts with insufficient information to code with codable children: Secondary | ICD-10-CM

## 2016-05-23 DIAGNOSIS — I6389 Other cerebral infarction: Secondary | ICD-10-CM

## 2016-05-23 DIAGNOSIS — I639 Cerebral infarction, unspecified: Secondary | ICD-10-CM

## 2016-05-23 LAB — PSA: PSA: 0.4 ng/mL (ref <=4.0)

## 2016-05-23 NOTE — Assessment & Plan Note (Signed)
Check lipids today 

## 2016-05-23 NOTE — Assessment & Plan Note (Signed)
Well controlled 

## 2016-05-23 NOTE — Assessment & Plan Note (Signed)
Last TSH was 3 months ago, but he has gained back so we'll recheck, if underreplaced, can cause fatigue

## 2016-05-23 NOTE — Progress Notes (Signed)
BP 118/70 (BP Location: Left Arm, Patient Position: Sitting, Cuff Size: Large)   Pulse 70   Temp 98.1 F (36.7 C) (Oral)   Resp 18   Ht 6\' 3"  (1.905 m)   Wt 225 lb (102.1 kg)   SpO2 94%   BMI 28.12 kg/m    Subjective:    Patient ID: Duane [name], male    DOB: 1950-12-05, 65 y.o.   MRN: [id number]  HPI: Duane [name] is a 66 y.o. male  Chief Complaint  Patient presents with  . Follow-up    3 months with labs    He has gout and gets checked by Dr. Jefm Bryant (labs periodically); no damage of the joints from the gout; last gout flare was 2 years ago  He does not have much motivation; can't focus; the other doctor (neurologist) stopped the ritalin; thought to be just short-term medicine says wife; he was doing such much better on the ritalin; see other phone note; I just increased the SSRI; hx of stroke; followed by Dr. Alveda Reasons, neurologist, at [facility]; no new weakness or numbness; I personally called and spoke with Charolotte Eke, triage nurse, read May 9th note  Vitamin D deficiency, taking vitamin D 50k once a month  Depression screen [facility] 2/9 02/21/2016  Decreased Interest 0  Down, Depressed, Hopeless 0  PHQ - 2 Score 0   Relevant past medical, surgical, family and social history reviewed Past Medical History:  Diagnosis Date  . A-fib [facility])    requiring life-long anticouagulation  . Cerebellar infarct ([place])   . CKD (chronic kidney disease)    creatinine baseline 1.3-1.5 in 2015  . Gout   . Graves disease   . H/O hyperkalemia    with Bactrim  . Hyperlipidemia    requiring life long statin therapy  . Hypertension   . Hypothyroidism   . Left hemiparesis [facility]) May 2015   s/p strke  . Renal insufficiency   . Restrictive lung disease    mild  . Stroke ([place]) 02/20/14   left side hemiparesis, dysphagia  . UTI (urinary tract infection) due to Enterococcus May 2015  . Vitamin D deficiency disease    Past Surgical History:  Procedure Laterality Date  .  ENTEROSTOMY CLOSURE  11/27/15   ileostomy takedown  . EXPLORATORY LAPAROTOMY  11/27/15  . illeostomy  07/2014  . POLYPECTOMY  10/15   GI surgery to remove polyp   Family History  Problem Relation Age of Onset  . Arthritis Mother   . Heart attack Father   . Heart disease Father    Social History  Substance Use Topics  . Smoking status: Never Smoker  . Smokeless tobacco: Never Used  . Alcohol use No    Interim medical history since last visit reviewed. Allergies and medications reviewed  Review of Systems Per HPI unless specifically indicated above     Objective:    BP 118/70 (BP Location: Left Arm, Patient Position: Sitting, Cuff Size: Large)   Pulse 70   Temp 98.1 F (36.7 C) (Oral)   Resp 18   Ht 6\' 3"  (1.905 m)   Wt 225 lb (102.1 kg)   SpO2 94%   BMI 28.12 kg/m   Wt Readings from Last 3 Encounters:  05/23/16 225 lb (102.1 kg)  02/21/16 220 lb (99.8 kg)  01/10/16 211 lb (95.7 kg)    Physical Exam  Constitutional: He appears well-developed and well-nourished.  Weight gain noted  HENT:  Head: Normocephalic and atraumatic.  Eyes: No scleral icterus.  Neck: No JVD present.  Cardiovascular: Normal rate and regular rhythm.   No extrasystoles are present.  Pulmonary/Chest: Effort normal and breath sounds normal.  Abdominal: He exhibits no distension.  Musculoskeletal: He exhibits no edema.  Neurological: He is alert.  Skin: No pallor.  Psychiatric: His mood appears not anxious. His affect is blunt. His speech is not rapid and/or pressured. He is not slowed. He does not exhibit a depressed mood. He expresses no homicidal and no suicidal ideation.  Fair eye contact with examiner; appropriate historian; not as angry or short-tempered as in the past He is attentive.    Results for orders placed or performed in visit on 05/23/16  Testosterone  Result Value Ref Range   Testosterone 472 250 - 827 ng/dL  PSA  Result Value Ref Range   PSA 0.4 <=4.0 ng/mL        Assessment & Plan:   Problem List Items Addressed This Visit      Cardiovascular and Mediastinum   Stroke [facility])    Followed by neurologist      Hypertension    Well-controlled        Endocrine   Hypothyroidism following radioiodine therapy    Last TSH was 3 months ago, but he has gained back so we'll recheck, if underreplaced, can cause fatigue      Relevant Orders   TSH     Musculoskeletal and Integument   Chronic gout due to renal impairment    Managed by rheumatologist        Genitourinary   Anemia, chronic renal failure    Check cbc      Relevant Orders   CBC with Differential/Platelet     Other   Vitamin D deficiency    Taking vit D 50k once a month      Prostate cancer screening    Check psa      Relevant Orders   PSA (Completed)   Low libido    Check testosterone      Relevant Orders   Testosterone (Completed)   Hyperlipidemia    Check lipids today      Relevant Orders   Lipid panel   Fatigue    Check testosterone      Depression due to stroke (HCC)    Increased SSRI from 20 mg to 40 mg; waiting for return call from neurologist to see about starting back on ritalin, which I am happy to prescribe and manage her locally      Alkaline phosphatase elevation    recheck      Relevant Orders   Comprehensive metabolic panel    Other Visit Diagnoses   None.     Follow up plan: Return in about 3 months (around 08/23/2016) for medication management.  An after-visit summary was printed and given to the patient at [place].  Please see the patient instructions which may contain other information and recommendations beyond what is mentioned above in the assessment and plan.  No orders of the defined types were placed in this encounter.   Orders Placed This Encounter  Procedures  . CBC with Differential/Platelet  . Lipid panel  . TSH  . Comprehensive metabolic panel  . Testosterone  . PSA

## 2016-05-23 NOTE — Assessment & Plan Note (Signed)
recheck

## 2016-05-23 NOTE — Patient Instructions (Signed)
Your goal blood pressure is less than 140 mmHg on top. Try to follow the DASH guidelines (DASH stands for Dietary Approaches to Stop Hypertension) Try to limit the sodium in your diet.  Ideally, consume less than 1.5 grams (less than 1,500mg ) per day. Do not add salt when cooking or at the table.  Check the sodium amount on labels when shopping, and choose items lower in sodium when given a choice. Avoid or limit foods that already contain a lot of sodium. Eat a diet rich in fruits and vegetables and whole grains.  We'll await word from your neurologist about the Ritalin Increase the fluoxetine from 20 mg to 40 mg daily, and call me in 3-4 weeks with an update

## 2016-05-23 NOTE — Assessment & Plan Note (Signed)
Managed by rheumatologist.

## 2016-05-23 NOTE — Assessment & Plan Note (Signed)
Followed by neurologist 

## 2016-05-23 NOTE — Assessment & Plan Note (Signed)
Taking vit D 50k once a month

## 2016-05-23 NOTE — Assessment & Plan Note (Signed)
Check psa 

## 2016-05-23 NOTE — Assessment & Plan Note (Signed)
Increased SSRI from 20 mg to 40 mg; waiting for return call from neurologist to see about starting back on ritalin, which I am happy to prescribe and manage her locally

## 2016-05-23 NOTE — Assessment & Plan Note (Signed)
Check cbc 

## 2016-05-23 NOTE — Assessment & Plan Note (Signed)
Check testosterone. 

## 2016-05-24 LAB — TESTOSTERONE: Testosterone: 472 ng/dL (ref 250–827)

## 2016-06-06 ENCOUNTER — Telehealth: Payer: Self-pay | Admitting: Family Medicine

## 2016-06-06 NOTE — Telephone Encounter (Signed)
There are several outstanding lab orders for Duane [name] ([birth year]); please check on results of have him return for redraw Thank you

## 2016-06-07 NOTE — Telephone Encounter (Signed)
Left voice mail

## 2016-06-10 ENCOUNTER — Telehealth: Payer: Self-pay

## 2016-06-10 NOTE — Telephone Encounter (Signed)
Left voicemail with [facility] neurologist

## 2016-06-10 NOTE — Telephone Encounter (Signed)
Patients wife called checking to see if you ever talked with the [facility] Dr. About the ritalin? She has not heard anything?

## 2016-06-10 NOTE — Telephone Encounter (Signed)
Try to get them on phone please; I have not heard anything

## 2016-06-13 ENCOUNTER — Other Ambulatory Visit: Payer: Self-pay | Admitting: Family Medicine

## 2016-06-13 LAB — COMPREHENSIVE METABOLIC PANEL
ALBUMIN: 3.3 g/dL — AB (ref 3.6–5.1)
ALK PHOS: 148 U/L — AB (ref 40–115)
ALT: 36 U/L (ref 9–46)
AST: 36 U/L — ABNORMAL HIGH (ref 10–35)
BUN: 29 mg/dL — AB (ref 7–25)
CO2: 24 mmol/L (ref 20–31)
CREATININE: 2.43 mg/dL — AB (ref 0.70–1.25)
Calcium: 8.7 mg/dL (ref 8.6–10.3)
Chloride: 108 mmol/L (ref 98–110)
Glucose, Bld: 90 mg/dL (ref 65–99)
Potassium: 4.8 mmol/L (ref 3.5–5.3)
SODIUM: 143 mmol/L (ref 135–146)
TOTAL PROTEIN: 6.3 g/dL (ref 6.1–8.1)
Total Bilirubin: 0.5 mg/dL (ref 0.2–1.2)

## 2016-06-13 LAB — TSH: TSH: 1.55 m[IU]/L (ref 0.40–4.50)

## 2016-06-13 LAB — CBC WITH DIFFERENTIAL/PLATELET
BASOS ABS: 0 {cells}/uL (ref 0–200)
BASOS PCT: 0 %
EOS ABS: 213 {cells}/uL (ref 15–500)
EOS PCT: 3 %
HCT: 39.8 % (ref 38.5–50.0)
HEMOGLOBIN: 12.9 g/dL — AB (ref 13.2–17.1)
LYMPHS ABS: 1420 {cells}/uL (ref 850–3900)
Lymphocytes Relative: 20 %
MCH: 30.4 pg (ref 27.0–33.0)
MCHC: 32.4 g/dL (ref 32.0–36.0)
MCV: 93.6 fL (ref 80.0–100.0)
MONOS PCT: 7 %
MPV: 13.3 fL — AB (ref 7.5–12.5)
Monocytes Absolute: 497 cells/uL (ref 200–950)
NEUTROS ABS: 4970 {cells}/uL (ref 1500–7800)
Neutrophils Relative %: 70 %
PLATELETS: 174 10*3/uL (ref 140–400)
RBC: 4.25 MIL/uL (ref 4.20–5.80)
RDW: 16.4 % — ABNORMAL HIGH (ref 11.0–15.0)
WBC: 7.1 10*3/uL (ref 3.8–10.8)

## 2016-06-13 LAB — LIPID PANEL
CHOLESTEROL: 178 mg/dL (ref 125–200)
HDL: 32 mg/dL — ABNORMAL LOW (ref >=40)
LDL Cholesterol: 110 mg/dL (ref <130)
TRIGLYCERIDES: 178 mg/dL — AB (ref <150)
Total CHOL/HDL Ratio: 5.6 Ratio — ABNORMAL HIGH (ref <=5.0)
VLDL: 36 mg/dL — ABNORMAL HIGH (ref <30)

## 2016-06-14 ENCOUNTER — Other Ambulatory Visit: Payer: Self-pay | Admitting: Family Medicine

## 2016-06-14 DIAGNOSIS — Z5181 Encounter for therapeutic drug level monitoring: Secondary | ICD-10-CM | POA: Insufficient documentation

## 2016-06-14 DIAGNOSIS — E785 Hyperlipidemia, unspecified: Secondary | ICD-10-CM

## 2016-06-14 MED ORDER — ATORVASTATIN CALCIUM 20 MG PO TABS: 20.0000 mg | ORAL_TABLET | Freq: Every day | ORAL | 1 refills | Status: DC

## 2016-06-14 NOTE — Assessment & Plan Note (Signed)
Check liver enzymes 8 weeks after adjusting statin

## 2016-06-14 NOTE — Assessment & Plan Note (Signed)
Increase statin, recheck lipids fasting in 8 weeks

## 2016-06-25 NOTE — Telephone Encounter (Signed)
I have called twice and Dr.Lada has called and left voicemails with no response.

## 2016-07-05 ENCOUNTER — Telehealth: Payer: Self-pay | Admitting: Family Medicine

## 2016-07-05 MED ORDER — METHYLPHENIDATE HCL 5 MG PO TABS: 5.0000 mg | ORAL_TABLET | Freq: Two times a day (BID) | ORAL | 0 refills | Status: DC

## 2016-07-05 NOTE — Telephone Encounter (Signed)
States that Dr Sanda Klein got in touch with husband stroke doctor at [facility] 3 weeks ago and they have not heard anything. Wife Duane [name]) would like to discuss the next step

## 2016-07-05 NOTE — Telephone Encounter (Signed)
I spoke with pt's wife I never heard back from [facility] after 2nd call I am willing to prescribe the adderall for him which seemed to do him so much good; wife says he just sits around; mind isn't focused at all; explained risk, but we both agree it provided benefit She can pick up Rx today or next week

## 2016-07-20 ENCOUNTER — Other Ambulatory Visit: Payer: Self-pay | Admitting: Family Medicine

## 2016-07-24 ENCOUNTER — Telehealth: Payer: Self-pay

## 2016-07-24 NOTE — Telephone Encounter (Signed)
Patient called states the Eliquis is to expensive and was wondering if there was a cheaper option such as coumadin?  I told her You did not prescribe Eliquis and would need to talk with that dr.  But insisted me send to you and see if you would take over?

## 2016-07-25 NOTE — Telephone Encounter (Signed)
I would prefer that they talk to the doctor prescribing the Eliquis to see if there is anything cheaper; I don't know if coumadin is appropriate for him; thank you

## 2016-07-25 NOTE — Telephone Encounter (Signed)
Patient wife notified.

## 2016-08-02 ENCOUNTER — Other Ambulatory Visit: Payer: Self-pay | Admitting: Family Medicine

## 2016-08-06 ENCOUNTER — Telehealth: Payer: Self-pay

## 2016-08-06 NOTE — Telephone Encounter (Signed)
Pt's wife called regarding Duane [name] assistance form. Once filled out she would like it faxed to [facility].  Thanks

## 2016-08-06 NOTE — Telephone Encounter (Signed)
Please ask her to fill out their section and then fill out as much of our section as you can please; it's in the fax tray; thank you

## 2016-08-07 NOTE — Telephone Encounter (Signed)
Done

## 2016-08-08 ENCOUNTER — Telehealth: Payer: Self-pay | Admitting: Family Medicine

## 2016-08-08 MED ORDER — METHYLPHENIDATE HCL 5 MG PO TABS: 5.0000 mg | ORAL_TABLET | Freq: Two times a day (BID) | ORAL | 0 refills | Status: DC

## 2016-08-08 NOTE — Telephone Encounter (Signed)
Pt will pick it up tomorrow

## 2016-08-08 NOTE — Telephone Encounter (Signed)
Patient requesting refill on ritalin 5mg . Patient has appt for next month.

## 2016-08-08 NOTE — Telephone Encounter (Signed)
[place] web site reviewed; last fill 07/10/16; no other prescribers since I assumed Rx Okay for refill Wife will have to pick this up (can't be faxed, sorry) Ready to pick up

## 2016-08-15 DIAGNOSIS — H2513 Age-related nuclear cataract, bilateral: Secondary | ICD-10-CM | POA: Diagnosis not present

## 2016-08-20 ENCOUNTER — Encounter: Payer: Self-pay | Admitting: *Deleted

## 2016-08-20 NOTE — Discharge Instructions (Signed)

## 2016-08-21 ENCOUNTER — Encounter: Payer: Self-pay | Admitting: Family Medicine

## 2016-08-21 ENCOUNTER — Ambulatory Visit (INDEPENDENT_AMBULATORY_CARE_PROVIDER_SITE_OTHER): Payer: Medicare HMO | Admitting: Family Medicine

## 2016-08-21 DIAGNOSIS — I639 Cerebral infarction, unspecified: Secondary | ICD-10-CM

## 2016-08-21 DIAGNOSIS — M1A30X0 Chronic gout due to renal impairment, unspecified site, without tophus (tophi): Secondary | ICD-10-CM | POA: Diagnosis not present

## 2016-08-21 DIAGNOSIS — I638 Other cerebral infarction: Secondary | ICD-10-CM

## 2016-08-21 DIAGNOSIS — G8929 Other chronic pain: Secondary | ICD-10-CM | POA: Diagnosis not present

## 2016-08-21 DIAGNOSIS — IMO0002 Reserved for concepts with insufficient information to code with codable children: Secondary | ICD-10-CM

## 2016-08-21 DIAGNOSIS — Z5181 Encounter for therapeutic drug level monitoring: Secondary | ICD-10-CM

## 2016-08-21 DIAGNOSIS — N184 Chronic kidney disease, stage 4 (severe): Secondary | ICD-10-CM | POA: Diagnosis not present

## 2016-08-21 DIAGNOSIS — I10 Essential (primary) hypertension: Secondary | ICD-10-CM | POA: Diagnosis not present

## 2016-08-21 DIAGNOSIS — E785 Hyperlipidemia, unspecified: Secondary | ICD-10-CM | POA: Diagnosis not present

## 2016-08-21 DIAGNOSIS — M545 Low back pain, unspecified: Secondary | ICD-10-CM | POA: Insufficient documentation

## 2016-08-21 DIAGNOSIS — R748 Abnormal levels of other serum enzymes: Secondary | ICD-10-CM

## 2016-08-21 DIAGNOSIS — F0631 Mood disorder due to known physiological condition with depressive features: Secondary | ICD-10-CM

## 2016-08-21 DIAGNOSIS — I6389 Other cerebral infarction: Secondary | ICD-10-CM

## 2016-08-21 HISTORY — DX: Other chronic pain: G89.29

## 2016-08-21 HISTORY — DX: Low back pain, unspecified: M54.50

## 2016-08-21 LAB — HEPATIC FUNCTION PANEL
ALT: 55 U/L — AB (ref 9–46)
AST: 49 U/L — AB (ref 10–35)
Albumin: 3.5 g/dL — ABNORMAL LOW (ref 3.6–5.1)
Alkaline Phosphatase: 176 U/L — ABNORMAL HIGH (ref 40–115)
BILIRUBIN INDIRECT: 0.5 mg/dL (ref 0.2–1.2)
Bilirubin, Direct: 0.1 mg/dL (ref <=0.2)
TOTAL PROTEIN: 6.2 g/dL (ref 6.1–8.1)
Total Bilirubin: 0.6 mg/dL (ref 0.2–1.2)

## 2016-08-21 LAB — LIPID PANEL
CHOLESTEROL: 176 mg/dL (ref <200)
HDL: 35 mg/dL — ABNORMAL LOW (ref >40)
LDL CALC: 108 mg/dL — AB
TRIGLYCERIDES: 164 mg/dL — AB (ref <150)
Total CHOL/HDL Ratio: 5 Ratio — ABNORMAL HIGH (ref <5.0)
VLDL: 33 mg/dL — ABNORMAL HIGH (ref <30)

## 2016-08-21 MED ORDER — METHYLPHENIDATE HCL 5 MG PO TABS: 5.0000 mg | ORAL_TABLET | Freq: Two times a day (BID) | ORAL | 0 refills | Status: DC

## 2016-08-21 NOTE — Assessment & Plan Note (Signed)
Important to strengthen core muscles; refer to PT for eval and teaching, then patient can do exercises at home

## 2016-08-21 NOTE — Progress Notes (Signed)
BP 120/68 (BP Location: Left Arm, Patient Position: Sitting, Cuff Size: Normal)   Pulse 62   Temp 97.7 F (36.5 C) (Oral)   Resp 14   Ht 6\' 4"  (1.93 m)   Wt 225 lb 9 oz (102.3 kg)   SpO2 96%   BMI 27.46 kg/m    Subjective:    Patient ID: Duane [name], male    DOB: August 13, 1951, 65 y.o.   MRN: [id number]  HPI: Duane [name] is a 65 y.o. male  Chief Complaint  Patient presents with  . Follow-up    Medication mangement    Patient is here with his wife today  He has some lower back pain; across both sides lower back, in the muscles; no injury; not exercising; sits in a chair most of the day; not slouching too much, somewhat; no core exercises at all  Taking stimulant and antidepressant for symptoms following the stroke; no new deficits  No more gout flares; they see him at rheumatologist; on allupurinol, they monitor the uric acid  High cholesterol; just a little water; no foods; due for lipids today; hardly any eggs; no fatty meats; red meat 2x a week  Stage 4 CKD; sees nephrologist; avoiding NSAIDs  Depression screen [facility] 2/9 08/21/2016 02/21/2016  Decreased Interest 0 0  Down, Depressed, Hopeless 0 0  PHQ - 2 Score 0 0   Relevant past medical, surgical, family and social history reviewed Past Medical History:  Diagnosis Date  . A-fib [facility])    requiring life-long anticouagulation  . Cerebellar infarct ([place])   . CKD (chronic kidney disease)    creatinine baseline 1.3-1.5 in 2015  . GERD (gastroesophageal reflux disease)   . Gout   . Graves disease   . H/O hyperkalemia    with Bactrim  . Hyperlipidemia    requiring life long statin therapy  . Hypertension   . Hypothyroidism   . Left hemiparesis [facility]) May 2015   s/p strke  . Poor short term memory   . Renal insufficiency   . Restrictive lung disease    mild  . Stroke ([place]) 02/20/14   left side hemiparesis, dysphagia  . UTI (urinary tract infection) due to Enterococcus May 2015  . Vitamin D  deficiency disease    Past Surgical History:  Procedure Laterality Date  . CATARACT EXTRACTION W/PHACO Right 08/27/2016   Procedure: CATARACT EXTRACTION PHACO AND INTRAOCULAR LENS PLACEMENT (IOC);  Surgeon: Eulogio Bear, MD;  Location: [facility];  Service: Ophthalmology;  Laterality: Right;  RIGHT  . ENTEROSTOMY CLOSURE  11/27/15   ileostomy takedown  . EXPLORATORY LAPAROTOMY  11/27/15  . illeostomy  07/2014  . POLYPECTOMY  10/15   GI surgery to remove polyp   Family History  Problem Relation Age of Onset  . Arthritis Mother   . Heart attack Father   . Heart disease Father    Social History  Substance Use Topics  . Smoking status: Never Smoker  . Smokeless tobacco: Never Used  . Alcohol use No   Interim medical history since last visit reviewed. Allergies and medications reviewed  Review of Systems Per HPI unless specifically indicated above     Objective:    BP 120/68 (BP Location: Left Arm, Patient Position: Sitting, Cuff Size: Normal)   Pulse 62   Temp 97.7 F (36.5 C) (Oral)   Resp 14   Ht 6\' 4"  (1.93 m)   Wt 225 lb 9 oz (102.3 kg)   SpO2 96%  BMI 27.46 kg/m   Wt Readings from Last 3 Encounters:  08/27/16 223 lb (101.2 kg)  08/21/16 225 lb 9 oz (102.3 kg)  05/23/16 225 lb (102.1 kg)    Physical Exam  Constitutional: He appears well-developed and well-nourished.  Weight gain noted  HENT:  Head: Normocephalic and atraumatic.  Eyes: No scleral icterus.  Neck: No JVD present.  Cardiovascular: Normal rate.   Pulmonary/Chest: Effort normal and breath sounds normal.  Abdominal: He exhibits no distension.  Musculoskeletal: He exhibits no edema.  Neurological: He is alert.  Skin: He is not diaphoretic. No pallor.  Psychiatric: His mood appears not anxious. His affect is blunt. His speech is not rapid and/or pressured. He is not slowed. He does not exhibit a depressed mood. He expresses no homicidal and no suicidal ideation.  Fair eye contact  with examiner; limited historian, wife provides more of the history; not as angry or short-tempered as in the past He is attentive.   Results for orders placed or performed in visit on 08/21/16  Hepatic function panel  Result Value Ref Range   Total Bilirubin 0.6 0.2 - 1.2 mg/dL   Bilirubin, Direct 0.1 <=0.2 mg/dL   Indirect Bilirubin 0.5 0.2 - 1.2 mg/dL   Alkaline Phosphatase 176 (H) 40 - 115 U/L   AST 49 (H) 10 - 35 U/L   ALT 55 (H) 9 - 46 U/L   Total Protein 6.2 6.1 - 8.1 g/dL   Albumin 3.5 (L) 3.6 - 5.1 g/dL  Lipid panel  Result Value Ref Range   Cholesterol 176 <200 mg/dL   Triglycerides 164 (H) <150 mg/dL   HDL 35 (L) >40 mg/dL   Total CHOL/HDL Ratio 5.0 (H) <5.0 Ratio   VLDL 33 (H) <30 mg/dL   LDL Cholesterol 108 (H) mg/dL      Assessment & Plan:   Problem List Items Addressed This Visit      Cardiovascular and Mediastinum   Stroke (HCC) (Chronic)    With left hemiparesis, dysphagia; continue Eliquis; control BP and lipids      Hypertension (Chronic)    Excellent control      Depression due to stroke (HCC) (Chronic)    Continue ritalin (I'm glad to prescribe this since controlled and would not require him to go to [facility] for prescriptions); continue SSRI        Musculoskeletal and Integument   Chronic gout due to renal impairment    No gout flares recently; gout managed by rheumatologist        Genitourinary   Chronic kidney disease, stage IV (severe) (HCC) (Chronic)    Managed by nephrologist; avoid NSAIDs        Other   Hyperlipidemia    Monitor lipids; continue statin; goal LDL under 70; try to limit saturated fats      Encounter for medication monitoring    Follow SGPT on the statin      Chronic low back pain without sciatica    Important to strengthen core muscles; refer to PT for eval and teaching, then patient can do exercises at home      Relevant Orders   Ambulatory referral to Physical Therapy   Alkaline phosphatase elevation    Has  been over 200 in the past; hospitalized at [facility] in the past; numbers have come down over last year          Follow up plan: Return in about 3 months (around 11/21/2016) for medication management.  An after-visit summary  was printed and given to the patient at [facility].  Please see the patient instructions which may contain other information and recommendations beyond what is mentioned above in the assessment and plan.  Meds ordered this encounter  Medications  . DISCONTD: methylphenidate (RITALIN) 5 MG tablet    Sig: Take 1 tablet (5 mg total) by mouth 2 (two) times daily with breakfast and lunch. If needed for attention or focus    Dispense:  60 tablet    Refill:  0  . methylphenidate (RITALIN) 5 MG tablet    Sig: Take 1 tablet (5 mg total) by mouth 2 (two) times daily with breakfast and lunch. If needed for attention or focus; fill on after September 27, 2016    Dispense:  60 tablet    Refill:  0  . methylphenidate (RITALIN) 5 MG tablet    Sig: Take 1 tablet (5 mg total) by mouth 2 (two) times daily with breakfast and lunch. If needed for attention or focus; fill on or after Oct 28, 2016    Dispense:  60 tablet    Refill:  0    Orders Placed This Encounter  Procedures  . Ambulatory referral to Physical Therapy

## 2016-08-21 NOTE — Patient Instructions (Signed)
Try to limit saturated fats in your diet (bologna, hot dogs, barbeque, cheeseburgers, hamburgers, steak, bacon, sausage, cheese, etc.) and get more fresh fruits, vegetables, and whole grains  We'll get labs today If you have not heard anything from my staff in a week about any orders/referrals/studies from today, please contact us here to follow-up [phone]

## 2016-08-22 ENCOUNTER — Telehealth: Payer: Self-pay | Admitting: Family Medicine

## 2016-08-22 ENCOUNTER — Other Ambulatory Visit: Payer: Self-pay

## 2016-08-22 ENCOUNTER — Other Ambulatory Visit: Payer: Self-pay | Admitting: Family Medicine

## 2016-08-22 MED ORDER — APIXABAN 5 MG PO TABS: 5.0000 mg | ORAL_TABLET | Freq: Two times a day (BID) | ORAL | 3 refills | Status: DC

## 2016-08-22 MED ORDER — METOPROLOL TARTRATE 25 MG PO TABS: 25.0000 mg | ORAL_TABLET | Freq: Two times a day (BID) | ORAL | 3 refills | Status: DC

## 2016-08-22 NOTE — Telephone Encounter (Signed)
ready

## 2016-08-22 NOTE — Telephone Encounter (Signed)
changed

## 2016-08-22 NOTE — Telephone Encounter (Signed)
Patient wife called the assistance program needs a hard copy of the rx eliquis for 180 day supply with 3 refills.    ID number [id number] and fax to [phone]

## 2016-08-22 NOTE — Telephone Encounter (Signed)
Pt states he wants to use [facility] in [place] and not [facility]  in [place].

## 2016-08-26 ENCOUNTER — Telehealth: Payer: Self-pay | Admitting: Family Medicine

## 2016-08-26 DIAGNOSIS — E7800 Pure hypercholesterolemia, unspecified: Secondary | ICD-10-CM

## 2016-08-26 MED ORDER — ATORVASTATIN CALCIUM 20 MG PO TABS: 20.0000 mg | ORAL_TABLET | Freq: Every day | ORAL | 2 refills | Status: DC

## 2016-08-26 MED ORDER — LEVOTHYROXINE SODIUM 100 MCG PO TABS: 100.0000 ug | ORAL_TABLET | Freq: Every day | ORAL | 2 refills | Status: DC

## 2016-08-26 MED ORDER — FLUOXETINE HCL 40 MG PO CAPS: 40.0000 mg | ORAL_CAPSULE | Freq: Every day | ORAL | 2 refills | Status: DC

## 2016-08-26 NOTE — Telephone Encounter (Signed)
Pt is now on Medicare, and states  now the best pharmacy for him to use is [facility]. Atorvastatin,Fluoxetine, Levothyroxine. Pt needs his refills sent to this pharmacy. Pts wife states they have been trying to get this for awhile. Please advise.

## 2016-08-26 NOTE — Telephone Encounter (Signed)
rx's sent as requested. 

## 2016-08-27 ENCOUNTER — Ambulatory Visit
Admission: RE | Admit: 2016-08-27 | Discharge: 2016-08-27 | Disposition: A | Discharge status: Home / Self Care | Payer: Medicare HMO | Source: Ambulatory Visit | Attending: Ophthalmology | Admitting: Ophthalmology

## 2016-08-27 ENCOUNTER — Ambulatory Visit: Payer: Medicare HMO | Admitting: Anesthesiology

## 2016-08-27 ENCOUNTER — Encounter
Admission: RE | Disposition: A | Discharge status: Home / Self Care | Payer: Self-pay | Source: Ambulatory Visit | Attending: Ophthalmology

## 2016-08-27 ENCOUNTER — Telehealth: Payer: Self-pay

## 2016-08-27 DIAGNOSIS — Z8673 Personal history of transient ischemic attack (TIA), and cerebral infarction without residual deficits: Secondary | ICD-10-CM | POA: Diagnosis not present

## 2016-08-27 DIAGNOSIS — N289 Disorder of kidney and ureter, unspecified: Secondary | ICD-10-CM | POA: Insufficient documentation

## 2016-08-27 DIAGNOSIS — K219 Gastro-esophageal reflux disease without esophagitis: Secondary | ICD-10-CM | POA: Diagnosis not present

## 2016-08-27 DIAGNOSIS — H2513 Age-related nuclear cataract, bilateral: Secondary | ICD-10-CM | POA: Diagnosis not present

## 2016-08-27 DIAGNOSIS — I10 Essential (primary) hypertension: Secondary | ICD-10-CM | POA: Insufficient documentation

## 2016-08-27 DIAGNOSIS — E039 Hypothyroidism, unspecified: Secondary | ICD-10-CM | POA: Insufficient documentation

## 2016-08-27 DIAGNOSIS — H2511 Age-related nuclear cataract, right eye: Secondary | ICD-10-CM | POA: Diagnosis not present

## 2016-08-27 HISTORY — DX: Other amnesia: R41.3

## 2016-08-27 HISTORY — PX: CATARACT EXTRACTION W/PHACO: SHX586

## 2016-08-27 HISTORY — DX: Gastro-esophageal reflux disease without esophagitis: K21.9

## 2016-08-27 SURGERY — PHACOEMULSIFICATION, CATARACT, WITH IOL INSERTION
Anesthesia: Monitor Anesthesia Care | Site: Eye | Laterality: Right | Wound class: Clean

## 2016-08-27 MED ORDER — LIDOCAINE HCL (PF) 4 % IJ SOLN
INTRAOCULAR | Status: DC | PRN
  Administered 2016-08-27: .5 mL via OPHTHALMIC

## 2016-08-27 MED ORDER — EPINEPHRINE PF 1 MG/ML IJ SOLN
INTRAOCULAR | Status: DC | PRN
  Administered 2016-08-27: 73 mL via OPHTHALMIC

## 2016-08-27 MED ORDER — MIDAZOLAM HCL 2 MG/2ML IJ SOLN
INTRAMUSCULAR | Status: DC | PRN
  Administered 2016-08-27: 1 mg via INTRAVENOUS
  Administered 2016-08-27 (×2): .5 mg via INTRAVENOUS

## 2016-08-27 MED ORDER — ARMC OPHTHALMIC DILATING DROPS
1.0000 "application " | OPHTHALMIC | Status: DC | PRN
  Administered 2016-08-27 (×3): 1 via OPHTHALMIC

## 2016-08-27 MED ORDER — FENTANYL CITRATE (PF) 100 MCG/2ML IJ SOLN
INTRAMUSCULAR | Status: DC | PRN
  Administered 2016-08-27: 50 ug via INTRAVENOUS

## 2016-08-27 MED ORDER — SODIUM HYALURONATE 23 MG/ML IO SOLN
INTRAOCULAR | Status: DC | PRN
  Administered 2016-08-27: 0.6 mL via INTRAOCULAR

## 2016-08-27 MED ORDER — SODIUM HYALURONATE 10 MG/ML IO SOLN
INTRAOCULAR | Status: DC | PRN
  Administered 2016-08-27: 0.85 mL via INTRAOCULAR

## 2016-08-27 MED ORDER — ACETAMINOPHEN 325 MG PO TABS: 325.0000 mg | ORAL_TABLET | ORAL | Status: DC | PRN

## 2016-08-27 MED ORDER — MOXIFLOXACIN HCL 0.5 % OP SOLN
OPHTHALMIC | Status: DC | PRN
  Administered 2016-08-27: 1 [drp] via OPHTHALMIC

## 2016-08-27 MED ORDER — ACETAMINOPHEN 160 MG/5ML PO SOLN: 325.0000 mg | ORAL | Status: DC | PRN

## 2016-08-27 SURGICAL SUPPLY — 19 items
CANNULA ANT/CHMB 27GA (MISCELLANEOUS) ×2 IMPLANT
CUP MEDICINE 2OZ PLAST GRAD ST (MISCELLANEOUS) ×2 IMPLANT
DISSECTOR HYDRO NUCLEUS 50X22 (MISCELLANEOUS) ×2 IMPLANT
GLOVE BIO SURGEON STRL SZ8 (GLOVE) ×2 IMPLANT
GLOVE SURG LX 7.5 STRW (GLOVE) ×1
GLOVE SURG LX STRL 7.5 STRW (GLOVE) ×1 IMPLANT
GOWN STRL REUS W/ TWL LRG LVL3 (GOWN DISPOSABLE) ×2 IMPLANT
GOWN STRL REUS W/TWL LRG LVL3 (GOWN DISPOSABLE) ×2
LENS IOL TECNIS ITEC 16.5 (Intraocular Lens) ×2 IMPLANT
MARKER SKIN DUAL TIP RULER LAB (MISCELLANEOUS) ×2 IMPLANT
PACK CATARACT (MISCELLANEOUS) ×2 IMPLANT
PACK CATARACT BRASINGTON (MISCELLANEOUS) ×2 IMPLANT
PACK EYE AFTER SURG (MISCELLANEOUS) ×2 IMPLANT
SOL PREP PVP 2OZ (MISCELLANEOUS) ×2
SOLUTION PREP PVP 2OZ (MISCELLANEOUS) ×1 IMPLANT
SYR 3ML LL SCALE MARK (SYRINGE) ×2 IMPLANT
SYR TB 1ML LUER SLIP (SYRINGE) ×2 IMPLANT
WATER STERILE IRR 250ML POUR (IV SOLUTION) ×2 IMPLANT
WIPE NON LINTING 3.25X3.25 (MISCELLANEOUS) ×2 IMPLANT

## 2016-08-27 NOTE — Anesthesia Preprocedure Evaluation (Signed)
Anesthesia Evaluation  Patient identified by MRN, date of birth, ID band Patient awake    Reviewed: Allergy & Precautions, H&P , NPO status , Patient's Chart, lab work & pertinent test results  Airway Mallampati: II  TM Distance: >3 FB Neck ROM: full    Dental  (+) Poor Dentition   Pulmonary    Pulmonary exam normal        Cardiovascular hypertension, Normal cardiovascular exam     Neuro/Psych PSYCHIATRIC DISORDERS CVA    GI/Hepatic GERD  ,  Endo/Other  Hypothyroidism   Renal/GU Renal disease     Musculoskeletal   Abdominal   Peds  Hematology   Anesthesia Other Findings   Reproductive/Obstetrics                             Anesthesia Physical Anesthesia Plan  ASA: III  Anesthesia Plan: MAC   Post-op Pain Management:    Induction:   Airway Management Planned:   Additional Equipment:   Intra-op Plan:   Post-operative Plan:   Informed Consent: I have reviewed the patients History and Physical, chart, labs and discussed the procedure including the risks, benefits and alternatives for the proposed anesthesia with the patient or authorized representative who has indicated his/her understanding and acceptance.     Plan Discussed with:   Anesthesia Plan Comments:         Anesthesia Quick Evaluation

## 2016-08-27 NOTE — Anesthesia Postprocedure Evaluation (Signed)
Anesthesia Post Note  Patient: Duane [name]  Procedure(s) Performed: Procedure(s) (LRB): CATARACT EXTRACTION PHACO AND INTRAOCULAR LENS PLACEMENT (IOC) (Right)  Patient location during evaluation: PACU Anesthesia Type: MAC Level of consciousness: awake and alert and oriented Pain management: satisfactory to patient Vital Signs Assessment: post-procedure vital signs reviewed and stable Respiratory status: spontaneous breathing, nonlabored ventilation and respiratory function stable Cardiovascular status: blood pressure returned to baseline and stable Postop Assessment: Adequate PO intake and No signs of nausea or vomiting Anesthetic complications: no    Raliegh Ip

## 2016-08-27 NOTE — H&P (Signed)
The History and Physical notes are on paper, have been signed, and are to be scanned. The patient remains stable and unchanged from the H&P.   Previous H&P reviewed, patient examined, and there are no changes.  Duane [name] 08/27/2016 10:20 AM

## 2016-08-27 NOTE — Op Note (Signed)
OPERATIVE NOTE  Duane [name] [id number] 08/27/2016   PREOPERATIVE DIAGNOSIS:  Nuclear sclerotic cataract right eye.  H25.11   POSTOPERATIVE DIAGNOSIS:    Nuclear sclerotic cataract right eye.     PROCEDURE:  Phacoemusification with posterior chamber intraocular lens placement of the right eye   LENS:   Implant Name Type Inv. Item Serial No. Manufacturer Lot No. LRB No. Used  LENS IOL DIOP 16.5 - [id number] Intraocular Lens LENS IOL DIOP 16.5 [id number]   Right 1       PCB00 +16.5   ULTRASOUND TIME: 0 minutes 53 seconds.  CDE 10.58   SURGEON:  Benay Pillow, MD, MPH  ANESTHESIOLOGIST: Anesthesiologist: Ronelle Nigh, MD CRNA: Mayme Genta, CRNA   ANESTHESIA:  Topical with tetracaine drops augmented with 1% preservative-free intracameral lidocaine.  ESTIMATED BLOOD LOSS: less than 1 mL.   COMPLICATIONS:  None.   DESCRIPTION OF PROCEDURE:  The patient was identified in the holding room and transported to the operating room and placed in the supine position under the operating microscope.  The right eye was identified as the operative eye and it was prepped and draped in the usual sterile ophthalmic fashion.   A 1.0 millimeter clear-corneal paracentesis was made at the 10:30 position. 0.5 ml of preservative-free 1% lidocaine with epinephrine was injected into the anterior chamber.  The anterior chamber was filled with Healon 5 viscoelastic.  A 2.4 millimeter keratome was used to make a near-clear corneal incision at the 8:00 position.  A curvilinear capsulorrhexis was made with a cystotome and capsulorrhexis forceps.  Balanced salt solution was used to hydrodissect and hydrodelineate the nucleus.   Phacoemulsification was then used in stop and chop fashion to remove the lens nucleus and epinucleus.  The remaining cortex was then removed using the irrigation and aspiration handpiece. Healon was then placed into the capsular bag to distend it for lens placement.  A lens  was then injected into the capsular bag.  The remaining viscoelastic was aspirated.   Wounds were hydrated with balanced salt solution.  The anterior chamber was inflated to a physiologic pressure with balanced salt solution.    Intracameral vigamox 0.1 mL undiluted was injected into the eye.  There was a small linear epi defect about 2-3 mm which was apparent at the start of the case, perhaps created when placing the lid speculum or drapes.  This was temporal and did not significantly compromise the view.  No wound leaks were noted.  Topical Vigamox drops were applied to the eye.  The patient was taken to the recovery room in stable condition without complications of anesthesia or surgery  Benay Pillow 08/27/2016, 11:06 AM

## 2016-08-27 NOTE — Telephone Encounter (Signed)
Pt informed

## 2016-08-27 NOTE — Telephone Encounter (Signed)
Tried to call patient about denial for patient assistance foundation but left voicemail.

## 2016-08-27 NOTE — Anesthesia Procedure Notes (Signed)
Procedure Name: MAC Performed by: Deanie Jupiter Pre-anesthesia Checklist: Patient identified, Emergency Drugs available, Suction available, Timeout performed and Patient being monitored Patient Re-evaluated:Patient Re-evaluated prior to inductionOxygen Delivery Method: Nasal cannula Placement Confirmation: positive ETCO2       

## 2016-08-27 NOTE — Transfer of Care (Signed)
Immediate Anesthesia Transfer of Care Note  Patient: Duane [name]  Procedure(s) Performed: Procedure(s) with comments: CATARACT EXTRACTION PHACO AND INTRAOCULAR LENS PLACEMENT (IOC) (Right) - RIGHT  Patient Location: PACU  Anesthesia Type: MAC  Level of Consciousness: awake, alert  and patient cooperative  Airway and Oxygen Therapy: Patient Spontanous Breathing and Patient connected to supplemental oxygen  Post-op Assessment: Post-op Vital signs reviewed, Patient's Cardiovascular Status Stable, Respiratory Function Stable, Patent Airway and No signs of Nausea or vomiting  Post-op Vital Signs: Reviewed and stable  Complications: No apparent anesthesia complications

## 2016-08-28 ENCOUNTER — Encounter: Payer: Self-pay | Admitting: Ophthalmology

## 2016-08-29 ENCOUNTER — Other Ambulatory Visit: Payer: Self-pay | Admitting: Family Medicine

## 2016-08-29 MED ORDER — APIXABAN 5 MG PO TABS: 5.0000 mg | ORAL_TABLET | Freq: Two times a day (BID) | ORAL | 3 refills | Status: DC

## 2016-08-29 NOTE — Telephone Encounter (Signed)
Needs rx sent to [facility]

## 2016-08-29 NOTE — Telephone Encounter (Signed)
PT IS NEEDING REFILL ON HIS ELIQUIS 5MG . [place]

## 2016-08-31 NOTE — Assessment & Plan Note (Signed)
Has been over 200 in the past; hospitalized at [facility] in the past; numbers have come down over last year

## 2016-08-31 NOTE — Assessment & Plan Note (Signed)
Monitor lipids; continue statin; goal LDL under 70; try to limit saturated fats

## 2016-08-31 NOTE — Assessment & Plan Note (Signed)
Managed by nephrologist; avoid NSAIDs 

## 2016-08-31 NOTE — Assessment & Plan Note (Signed)
Excellent control.   

## 2016-08-31 NOTE — Assessment & Plan Note (Signed)
Follow SGPT on the statin

## 2016-08-31 NOTE — Assessment & Plan Note (Signed)
With left hemiparesis, dysphagia; continue Eliquis; control BP and lipids

## 2016-08-31 NOTE — Assessment & Plan Note (Signed)
Continue ritalin (I'm glad to prescribe this since controlled and would not require him to go to [facility] for prescriptions); continue SSRI

## 2016-08-31 NOTE — Assessment & Plan Note (Signed)
No gout flares recently; gout managed by rheumatologist

## 2016-09-19 DIAGNOSIS — I480 Paroxysmal atrial fibrillation: Secondary | ICD-10-CM | POA: Diagnosis not present

## 2016-09-19 DIAGNOSIS — Z8673 Personal history of transient ischemic attack (TIA), and cerebral infarction without residual deficits: Secondary | ICD-10-CM | POA: Diagnosis not present

## 2016-09-19 DIAGNOSIS — E782 Mixed hyperlipidemia: Secondary | ICD-10-CM | POA: Diagnosis not present

## 2016-10-15 DIAGNOSIS — N2581 Secondary hyperparathyroidism of renal origin: Secondary | ICD-10-CM | POA: Diagnosis not present

## 2016-10-15 DIAGNOSIS — N183 Chronic kidney disease, stage 3 (moderate): Secondary | ICD-10-CM | POA: Diagnosis not present

## 2016-10-15 DIAGNOSIS — D631 Anemia in chronic kidney disease: Secondary | ICD-10-CM | POA: Diagnosis not present

## 2016-10-15 DIAGNOSIS — I4891 Unspecified atrial fibrillation: Secondary | ICD-10-CM | POA: Diagnosis not present

## 2016-10-22 ENCOUNTER — Other Ambulatory Visit: Payer: Self-pay

## 2016-10-23 MED ORDER — MIRTAZAPINE 15 MG PO TABS: ORAL_TABLET | ORAL | 5 refills | Status: DC

## 2016-10-23 NOTE — Telephone Encounter (Signed)
rx approved

## 2016-11-19 ENCOUNTER — Other Ambulatory Visit: Payer: Self-pay | Admitting: Family Medicine

## 2016-11-19 DIAGNOSIS — E7800 Pure hypercholesterolemia, unspecified: Secondary | ICD-10-CM

## 2016-11-19 NOTE — Telephone Encounter (Signed)
Please see lab results from November; patient due for labs

## 2016-11-20 NOTE — Telephone Encounter (Signed)
Pt wife notified.

## 2016-11-21 ENCOUNTER — Encounter: Payer: Self-pay | Admitting: Family Medicine

## 2016-11-21 ENCOUNTER — Other Ambulatory Visit: Payer: Self-pay | Admitting: Family Medicine

## 2016-11-21 ENCOUNTER — Ambulatory Visit (INDEPENDENT_AMBULATORY_CARE_PROVIDER_SITE_OTHER): Payer: Medicare HMO | Admitting: Family Medicine

## 2016-11-21 VITALS — BP 106/58 | HR 98 | Temp 96.3°F | Wt 224.3 lb

## 2016-11-21 DIAGNOSIS — Z5181 Encounter for therapeutic drug level monitoring: Secondary | ICD-10-CM | POA: Diagnosis not present

## 2016-11-21 DIAGNOSIS — R748 Abnormal levels of other serum enzymes: Secondary | ICD-10-CM

## 2016-11-21 DIAGNOSIS — I10 Essential (primary) hypertension: Secondary | ICD-10-CM | POA: Diagnosis not present

## 2016-11-21 DIAGNOSIS — I480 Paroxysmal atrial fibrillation: Secondary | ICD-10-CM | POA: Diagnosis not present

## 2016-11-21 DIAGNOSIS — E559 Vitamin D deficiency, unspecified: Secondary | ICD-10-CM | POA: Diagnosis not present

## 2016-11-21 DIAGNOSIS — Z23 Encounter for immunization: Secondary | ICD-10-CM | POA: Diagnosis not present

## 2016-11-21 DIAGNOSIS — E890 Postprocedural hypothyroidism: Secondary | ICD-10-CM

## 2016-11-21 LAB — TSH: TSH: 0.75 m[IU]/L (ref 0.40–4.50)

## 2016-11-21 LAB — HEPATIC FUNCTION PANEL
ALBUMIN: 3.6 g/dL (ref 3.6–5.1)
ALT: 46 U/L (ref 9–46)
AST: 48 U/L — AB (ref 10–35)
Alkaline Phosphatase: 179 U/L — ABNORMAL HIGH (ref 40–115)
BILIRUBIN DIRECT: 0.1 mg/dL (ref <=0.2)
Indirect Bilirubin: 0.4 mg/dL (ref 0.2–1.2)
Total Bilirubin: 0.5 mg/dL (ref 0.2–1.2)
Total Protein: 6.4 g/dL (ref 6.1–8.1)

## 2016-11-21 LAB — CBC
HEMATOCRIT: 43.7 % (ref 38.5–50.0)
Hemoglobin: 14 g/dL (ref 13.2–17.1)
MCH: 31.3 pg (ref 27.0–33.0)
MCHC: 32 g/dL (ref 32.0–36.0)
MCV: 97.5 fL (ref 80.0–100.0)
MPV: 12.2 fL (ref 7.5–12.5)
Platelets: 168 10*3/uL (ref 140–400)
RBC: 4.48 MIL/uL (ref 4.20–5.80)
RDW: 14.6 % (ref 11.0–15.0)
WBC: 7.4 10*3/uL (ref 3.8–10.8)

## 2016-11-21 MED ORDER — METHYLPHENIDATE HCL 5 MG PO TABS: 5.0000 mg | ORAL_TABLET | Freq: Two times a day (BID) | ORAL | 0 refills | Status: DC

## 2016-11-21 MED ORDER — DOXAZOSIN MESYLATE 2 MG PO TABS: 1.0000 mg | ORAL_TABLET | Freq: Every day | ORAL | 11 refills | Status: DC

## 2016-11-21 NOTE — Assessment & Plan Note (Signed)
Normal sinus today on exam; no bleeding

## 2016-11-21 NOTE — Assessment & Plan Note (Signed)
Check labs today.

## 2016-11-21 NOTE — Progress Notes (Signed)
BP (!) 106/58   Pulse 98   Temp (!) 96.3 F (35.7 C) (Oral)   Wt 224 lb 4.8 oz (101.7 kg)   SpO2 98%   BMI 27.30 kg/m    Subjective:    Patient ID: Duane [name], male    DOB: 1951/05/24, 66 y.o.   MRN: [id number]  HPI: Duane [name] is a 66 y.o. male  Chief Complaint  Patient presents with  . Follow-up   Patient is here with his wife No chest pain, no trouble breathing, moving bowels okay, no blood in stools Energy level only 3 out of 10 Dr. Ubaldo Glassing stopped BP medicine LFTs trending back up on last check; no abd pain He saw Dr. Holley Raring, nephrologist, GFR was 27 in January; no NSAIDs High cholesterol; eats cereal and milk and juice for breakfast; does a light lunch; snacks, then one big meal 5-6 pm, one hamburger a week Taking ritalin BID; might miss a lunch dose every once in a while; on those missed days, he is ill as a hornet; double ill; just flies off the handle and then apologizes; no physical harm testosterone 472 Does eat occasional meat  Depression screen [facility] 2/9 11/21/2016 08/21/2016 02/21/2016  Decreased Interest 0 0 0  Down, Depressed, Hopeless 0 0 0  PHQ - 2 Score 0 0 0   Relevant past medical, surgical, family and social history reviewed Past Medical History:  Diagnosis Date  . A-fib [facility])    requiring life-long anticouagulation  . Cerebellar infarct ([place])   . CKD (chronic kidney disease)    creatinine baseline 1.3-1.5 in 2015  . GERD (gastroesophageal reflux disease)   . Gout   . Graves disease   . H/O hyperkalemia    with Bactrim  . Hyperlipidemia    requiring life long statin therapy  . Hypertension   . Hypothyroidism   . Left hemiparesis [facility]) May 2015   s/p strke  . Poor short term memory   . Renal insufficiency   . Restrictive lung disease    mild  . Stroke ([place]) 02/20/14   left side hemiparesis, dysphagia  . UTI (urinary tract infection) due to Enterococcus May 2015  . Vitamin D deficiency disease    Past Surgical History:    Procedure Laterality Date  . CATARACT EXTRACTION W/PHACO Right 08/27/2016   Procedure: CATARACT EXTRACTION PHACO AND INTRAOCULAR LENS PLACEMENT (IOC);  Surgeon: Eulogio Bear, MD;  Location: [facility];  Service: Ophthalmology;  Laterality: Right;  RIGHT  . ENTEROSTOMY CLOSURE  11/27/15   ileostomy takedown  . EXPLORATORY LAPAROTOMY  11/27/15  . illeostomy  07/2014  . POLYPECTOMY  10/15   GI surgery to remove polyp   Family History  Problem Relation Age of Onset  . Arthritis Mother   . Heart attack Father   . Heart disease Father   . Cirrhosis Sister    Social History  Substance Use Topics  . Smoking status: Never Smoker  . Smokeless tobacco: Never Used  . Alcohol use No   Interim medical history since last visit reviewed. Allergies and medications reviewed  Review of Systems Per HPI unless specifically indicated above     Objective:    BP (!) 106/58   Pulse 98   Temp (!) 96.3 F (35.7 C) (Oral)   Wt 224 lb 4.8 oz (101.7 kg)   SpO2 98%   BMI 27.30 kg/m   Wt Readings from Last 3 Encounters:  11/21/16 224 lb 4.8 oz (101.7  kg)  08/27/16 223 lb (101.2 kg)  08/21/16 225 lb 9 oz (102.3 kg)    Physical Exam  Constitutional: He appears well-developed and well-nourished.  Weight stable overall  HENT:  Head: Normocephalic and atraumatic.  Eyes: No scleral icterus.  Neck: No JVD present.  Cardiovascular: Normal rate and regular rhythm.   Pulmonary/Chest: Effort normal and breath sounds normal. He has no decreased breath sounds.  Abdominal: He exhibits no distension.  Musculoskeletal: He exhibits no edema.  Neurological: He is alert.  Skin: He is not diaphoretic. No pallor.  Psychiatric: His mood appears not anxious. His affect is blunt. His speech is not rapid and/or pressured. He is not slowed. He does not exhibit a depressed mood. He expresses no homicidal and no suicidal ideation.  Fair eye contact with examiner; limited historian, wife provides more of  the history; not as angry or short-tempered as in the past He is attentive.      Assessment & Plan:   Problem List Items Addressed This Visit      Cardiovascular and Mediastinum   Hypertension (Chronic)    BP med stopped by Dr. Ubaldo Glassing and still running low; decreasing cardura      Relevant Medications   doxazosin (CARDURA) 2 MG tablet   A-fib ([place]) - Primary    Normal sinus today on exam; no bleeding       Relevant Medications   doxazosin (CARDURA) 2 MG tablet     Endocrine   Hypothyroidism following radioiodine therapy (Chronic)    Check TSH and adjust dose if needed      Relevant Orders   TSH (Completed)     Other   Vitamin D deficiency    Monitored by nephrologist; used to be on vit D 50k once a month for 6-9 months, now just 2,000 iu daily      Encounter for medication monitoring    Monitor CBC on Eliquis      Relevant Orders   CBC (Completed)   Alkaline phosphatase elevation    Check labs today      Relevant Orders   Hepatic function panel (Completed)    Other Visit Diagnoses    Needs flu shot       Relevant Orders   Flu vaccine HIGH DOSE PF (Fluzone High dose) (Completed)   Need for vaccination with 13-polyvalent pneumococcal conjugate vaccine          Follow up plan: No Follow-up on file.  An after-visit summary was printed and given to the patient at [place].  Please see the patient instructions which may contain other information and recommendations beyond what is mentioned above in the assessment and plan.  Meds ordered this encounter  Medications  . Cholecalciferol (VITAMIN D3) 2000 units capsule    Sig: Take 2,000 Units by mouth daily.  Marland Kitchen doxazosin (CARDURA) 2 MG tablet    Sig: Take 0.5 tablets (1 mg total) by mouth daily.    Dispense:  15 tablet    Refill:  11    Changing instructions  . methylphenidate (RITALIN) 5 MG tablet    Sig: Take 1 tablet (5 mg total) by mouth 2 (two) times daily with breakfast and lunch. If needed for  attention or focus; fill on or after January 18, 2017    Dispense:  60 tablet    Refill:  0  . methylphenidate (RITALIN) 5 MG tablet    Sig: Take 1 tablet (5 mg total) by mouth 2 (two) times daily with breakfast  and lunch. If needed for attention or focus; fill on or after December 19, 2016    Dispense:  60 tablet    Refill:  0    Orders Placed This Encounter  Procedures  . Flu vaccine HIGH DOSE PF (Fluzone High dose)  . TSH  . CBC  . Hepatic function panel

## 2016-11-21 NOTE — Assessment & Plan Note (Signed)
BP med stopped by Dr. Ubaldo Glassing and still running low; decreasing cardura

## 2016-11-21 NOTE — Assessment & Plan Note (Signed)
Check TSH and adjust dose if needed

## 2016-11-21 NOTE — Assessment & Plan Note (Signed)
Monitor CBC on Eliquis

## 2016-11-21 NOTE — Assessment & Plan Note (Signed)
Monitored by nephrologist; used to be on vit D 50k once a month for 6-9 months, now just 2,000 iu daily

## 2016-11-21 NOTE — Patient Instructions (Signed)
Decrease the Cardura (doxazosin) to just 1 mg daily Monitor blood pressure and urinary symptoms Contact me with any problems Let's get labs today Return in 3 months

## 2016-11-26 ENCOUNTER — Other Ambulatory Visit: Payer: Self-pay | Admitting: Family Medicine

## 2016-11-26 DIAGNOSIS — R748 Abnormal levels of other serum enzymes: Secondary | ICD-10-CM

## 2016-11-26 LAB — ALKALINE PHOSPHATASE: ALK PHOS: 185 U/L — AB (ref 40–115)

## 2016-11-26 NOTE — Assessment & Plan Note (Signed)
Check isoenzyme

## 2016-11-26 NOTE — Progress Notes (Signed)
I actually need an alk phos isoenzyme; new order entered

## 2016-12-02 ENCOUNTER — Other Ambulatory Visit: Payer: Self-pay

## 2016-12-02 DIAGNOSIS — E7800 Pure hypercholesterolemia, unspecified: Secondary | ICD-10-CM

## 2016-12-02 DIAGNOSIS — R748 Abnormal levels of other serum enzymes: Secondary | ICD-10-CM

## 2016-12-02 MED ORDER — ATORVASTATIN CALCIUM 20 MG PO TABS: 20.0000 mg | ORAL_TABLET | Freq: Every day | ORAL | 0 refills | Status: DC

## 2016-12-02 NOTE — Telephone Encounter (Signed)
Because I'm waiting on those labs from [place] He is going to have to come back in, so I'll send in Rx, but I'm concerned about his liver; that's why the limited amount

## 2016-12-02 NOTE — Telephone Encounter (Signed)
Patient needs refill was only given 10 day supply?

## 2016-12-03 NOTE — Telephone Encounter (Signed)
Left detailed voicemail

## 2016-12-09 LAB — ALKALINE PHOSPHATASE ISOENZYMES
Alkaline Phonsphatase: 175 U/L — ABNORMAL HIGH (ref 40–115)
BONE ISOENZYMES (ALP ISO): 42 % (ref 28–66)
Intestinal Isoenzymes: 0 % — ABNORMAL LOW (ref 1–24)
Liver Isoenzymes: 58 % (ref 25–69)

## 2016-12-26 ENCOUNTER — Ambulatory Visit (INDEPENDENT_AMBULATORY_CARE_PROVIDER_SITE_OTHER): Payer: Medicare HMO | Admitting: Family Medicine

## 2016-12-26 ENCOUNTER — Encounter: Payer: Self-pay | Admitting: Family Medicine

## 2016-12-26 ENCOUNTER — Other Ambulatory Visit: Payer: Self-pay | Admitting: Family Medicine

## 2016-12-26 VITALS — BP 128/72 | HR 89 | Temp 98.8°F | Resp 16 | Wt 223.0 lb

## 2016-12-26 DIAGNOSIS — Z23 Encounter for immunization: Secondary | ICD-10-CM | POA: Diagnosis not present

## 2016-12-26 DIAGNOSIS — H6123 Impacted cerumen, bilateral: Secondary | ICD-10-CM

## 2016-12-26 DIAGNOSIS — E7800 Pure hypercholesterolemia, unspecified: Secondary | ICD-10-CM

## 2017-01-10 DIAGNOSIS — M1A9XX0 Chronic gout, unspecified, without tophus (tophi): Secondary | ICD-10-CM | POA: Diagnosis not present

## 2017-01-10 DIAGNOSIS — Z6826 Body mass index (BMI) 26.0-26.9, adult: Secondary | ICD-10-CM | POA: Diagnosis not present

## 2017-01-10 DIAGNOSIS — E039 Hypothyroidism, unspecified: Secondary | ICD-10-CM | POA: Diagnosis not present

## 2017-01-10 DIAGNOSIS — K219 Gastro-esophageal reflux disease without esophagitis: Secondary | ICD-10-CM | POA: Diagnosis not present

## 2017-01-10 DIAGNOSIS — E7800 Pure hypercholesterolemia, unspecified: Secondary | ICD-10-CM | POA: Diagnosis not present

## 2017-01-10 DIAGNOSIS — I4891 Unspecified atrial fibrillation: Secondary | ICD-10-CM | POA: Diagnosis not present

## 2017-01-10 DIAGNOSIS — Z0000 Encounter for general adult medical examination without abnormal findings: Secondary | ICD-10-CM | POA: Diagnosis not present

## 2017-01-10 DIAGNOSIS — I10 Essential (primary) hypertension: Secondary | ICD-10-CM | POA: Diagnosis not present

## 2017-01-10 DIAGNOSIS — R69 Illness, unspecified: Secondary | ICD-10-CM | POA: Diagnosis not present

## 2017-01-10 DIAGNOSIS — I6931 Attention and concentration deficit following cerebral infarction: Secondary | ICD-10-CM | POA: Diagnosis not present

## 2017-01-12 NOTE — Progress Notes (Signed)
BP 128/72   Pulse 89   Temp 98.8 F (37.1 C) (Oral)   Resp 16   Wt 223 lb (101.2 kg)   SpO2 94%   BMI 27.14 kg/m    Subjective:    Patient ID: Duane [name], male    DOB: August 27, 1951, 66 y.o.   MRN: [id number]  HPI: Duane [name] is a 66 y.o. male  Chief Complaint  Patient presents with  . Cerumen Impaction    clogged with ear wax   Patient is here for removal of earwax; both ears are affected; hearing has decreased; he is here with his wife  Depression screen [facility] 2/9 12/26/2016 11/21/2016 08/21/2016 02/21/2016  Decreased Interest 0 0 0 0  Down, Depressed, Hopeless 0 0 0 0  PHQ - 2 Score 0 0 0 0    Relevant past medical, surgical, family and social history reviewed Past Medical History:  Diagnosis Date  . A-fib [facility])    requiring life-long anticouagulation  . Cerebellar infarct ([place])   . CKD (chronic kidney disease)    creatinine baseline 1.3-1.5 in 2015  . GERD (gastroesophageal reflux disease)   . Gout   . Graves disease   . H/O hyperkalemia    with Bactrim  . Hyperlipidemia    requiring life long statin therapy  . Hypertension   . Hypothyroidism   . Left hemiparesis [facility]) May 2015   s/p strke  . Poor short term memory   . Renal insufficiency   . Restrictive lung disease    mild  . Stroke ([place]) 02/20/14   left side hemiparesis, dysphagia  . UTI (urinary tract infection) due to Enterococcus May 2015  . Vitamin D deficiency disease    Social History  Substance Use Topics  . Smoking status: Never Smoker  . Smokeless tobacco: Never Used  . Alcohol use No    Interim medical history since last visit reviewed. Allergies and medications reviewed  Review of Systems Per HPI unless specifically indicated above     Objective:    BP 128/72   Pulse 89   Temp 98.8 F (37.1 C) (Oral)   Resp 16   Wt 223 lb (101.2 kg)   SpO2 94%   BMI 27.14 kg/m   Wt Readings from Last 3 Encounters:  12/26/16 223 lb (101.2 kg)  11/21/16 224 lb 4.8 oz  (101.7 kg)  08/27/16 223 lb (101.2 kg)    Physical Exam  Constitutional: He appears well-developed and well-nourished. No distress.  HENT:  Right Ear: External ear normal.  Left Ear: External ear normal.  Mouth/Throat: Mucous membranes are normal.  Both canals impacted with cerumen; after cerumen removed, TMs intact, no erythema  Cardiovascular: Normal rate.   Pulmonary/Chest: Effort normal.       Assessment & Plan:   Problem List Items Addressed This Visit    None    Visit Diagnoses    Impacted cerumen of both ears    -  Primary   cerumen impacting both ears removed with irrigation and finished with curettage by MD; tolerated well by pt; may return 6-9 months for repeat PRN   Need for pneumococcal vaccination       Relevant Orders   Pneumococcal conjugate vaccine 13-valent (Completed)       Follow up plan: Return for welcome to medicare visit with Dr. Sanda Klein.  An after-visit summary was printed and given to the patient at [place].  Please see the patient instructions which may contain other information  and recommendations beyond what is mentioned above in the assessment and plan.  No orders of the defined types were placed in this encounter.   Orders Placed This Encounter  Procedures  . Pneumococcal conjugate vaccine 13-valent

## 2017-01-21 DIAGNOSIS — J984 Other disorders of lung: Secondary | ICD-10-CM | POA: Diagnosis not present

## 2017-01-21 DIAGNOSIS — T462X5A Adverse effect of other antidysrhythmic drugs, initial encounter: Secondary | ICD-10-CM | POA: Diagnosis not present

## 2017-01-21 DIAGNOSIS — Z8673 Personal history of transient ischemic attack (TIA), and cerebral infarction without residual deficits: Secondary | ICD-10-CM | POA: Diagnosis not present

## 2017-01-21 DIAGNOSIS — E782 Mixed hyperlipidemia: Secondary | ICD-10-CM | POA: Diagnosis not present

## 2017-01-21 DIAGNOSIS — N183 Chronic kidney disease, stage 3 (moderate): Secondary | ICD-10-CM | POA: Diagnosis not present

## 2017-01-21 DIAGNOSIS — I4891 Unspecified atrial fibrillation: Secondary | ICD-10-CM | POA: Diagnosis not present

## 2017-01-21 DIAGNOSIS — I480 Paroxysmal atrial fibrillation: Secondary | ICD-10-CM | POA: Diagnosis not present

## 2017-01-23 ENCOUNTER — Telehealth: Payer: Self-pay

## 2017-01-23 ENCOUNTER — Other Ambulatory Visit: Payer: Self-pay

## 2017-01-23 DIAGNOSIS — Z136 Encounter for screening for cardiovascular disorders: Secondary | ICD-10-CM

## 2017-01-23 NOTE — Telephone Encounter (Signed)
Patient's wife has been informed via voicemail that this patient has been scheduled to have his ultrasound on Monday, January 27, 2017 @ 8:30am at [facility]. She was informed that he is to arrive 54mins early and not to have anything to eat or drink after midnight.  Patient was given the number for centralized scheduling [phone]) in case the appt is not good for them.

## 2017-01-24 ENCOUNTER — Other Ambulatory Visit: Payer: Self-pay

## 2017-01-24 ENCOUNTER — Telehealth: Payer: Self-pay | Admitting: Family Medicine

## 2017-01-24 DIAGNOSIS — Z136 Encounter for screening for cardiovascular disorders: Secondary | ICD-10-CM

## 2017-01-24 MED ORDER — FAMOTIDINE 20 MG PO TABS: 20.0000 mg | ORAL_TABLET | Freq: Two times a day (BID) | ORAL | 2 refills | Status: DC

## 2017-01-24 NOTE — Telephone Encounter (Signed)
Called [place] back states he has had this evaluated in past with CT for same dx so ins will not cover so it will be canceled.

## 2017-01-24 NOTE — Telephone Encounter (Signed)
[place] Department from [facility] called stating that patient Duane [name] is scheduled for an Abdominal Complete US but the reason given was for an Aorta Screening which is not valid because the patient has had screenings in the past.  The order will need another reason.

## 2017-01-24 NOTE — Telephone Encounter (Signed)
Dr. Sanda Klein does want to order this new order placed for welcome to medicare AAA screen.

## 2017-01-27 ENCOUNTER — Ambulatory Visit: Payer: Medicare HMO

## 2017-02-03 ENCOUNTER — Telehealth: Payer: Self-pay

## 2017-02-03 NOTE — Telephone Encounter (Signed)
Patient's wife was informed that he has been scheduled to have his [place] on this Friday, February 07, 2017 @ 11am, but she stated that they had cancelled one due to the insurance not willing to cover it. I told her that it was up to them and scheduling's number was given in case they did not want to proceed.

## 2017-02-07 ENCOUNTER — Ambulatory Visit
Admission: RE | Admit: 2017-02-07 | Discharge: 2017-02-07 | Disposition: A | Discharge status: Home / Self Care | Payer: Medicare HMO | Source: Ambulatory Visit | Attending: Family Medicine | Admitting: Family Medicine

## 2017-02-07 DIAGNOSIS — Z136 Encounter for screening for cardiovascular disorders: Secondary | ICD-10-CM | POA: Diagnosis not present

## 2017-02-11 DIAGNOSIS — N2581 Secondary hyperparathyroidism of renal origin: Secondary | ICD-10-CM | POA: Diagnosis not present

## 2017-02-11 DIAGNOSIS — I482 Chronic atrial fibrillation: Secondary | ICD-10-CM | POA: Diagnosis not present

## 2017-02-11 DIAGNOSIS — D631 Anemia in chronic kidney disease: Secondary | ICD-10-CM | POA: Diagnosis not present

## 2017-02-11 DIAGNOSIS — N184 Chronic kidney disease, stage 4 (severe): Secondary | ICD-10-CM | POA: Diagnosis not present

## 2017-02-13 DIAGNOSIS — Z79899 Other long term (current) drug therapy: Secondary | ICD-10-CM | POA: Diagnosis not present

## 2017-02-19 ENCOUNTER — Ambulatory Visit (INDEPENDENT_AMBULATORY_CARE_PROVIDER_SITE_OTHER): Payer: Medicare HMO | Admitting: Family Medicine

## 2017-02-19 ENCOUNTER — Encounter: Payer: Self-pay | Admitting: Family Medicine

## 2017-02-19 VITALS — BP 126/74 | HR 82 | Temp 97.7°F | Resp 14 | Wt 220.5 lb

## 2017-02-19 DIAGNOSIS — N184 Chronic kidney disease, stage 4 (severe): Secondary | ICD-10-CM

## 2017-02-19 DIAGNOSIS — F0631 Mood disorder due to known physiological condition with depressive features: Secondary | ICD-10-CM

## 2017-02-19 DIAGNOSIS — D631 Anemia in chronic kidney disease: Secondary | ICD-10-CM | POA: Diagnosis not present

## 2017-02-19 DIAGNOSIS — R69 Illness, unspecified: Secondary | ICD-10-CM | POA: Diagnosis not present

## 2017-02-19 DIAGNOSIS — I69398 Other sequelae of cerebral infarction: Secondary | ICD-10-CM

## 2017-02-19 DIAGNOSIS — Z79899 Other long term (current) drug therapy: Secondary | ICD-10-CM | POA: Diagnosis not present

## 2017-02-19 MED ORDER — METHYLPHENIDATE HCL 5 MG PO TABS: 5.0000 mg | ORAL_TABLET | Freq: Two times a day (BID) | ORAL | 0 refills | Status: DC

## 2017-02-19 NOTE — Patient Instructions (Addendum)
Continue same regimen Try to follow DASH guidelines   DASH Eating Plan DASH stands for "Dietary Approaches to Stop Hypertension." The DASH eating plan is a healthy eating plan that has been shown to reduce high blood pressure (hypertension). It may also reduce your risk for type 2 diabetes, heart disease, and stroke. The DASH eating plan may also help with weight loss. What are tips for following this plan? General guidelines   Avoid eating more than 2,300 mg (milligrams) of salt (sodium) a day. If you have hypertension, you may need to reduce your sodium intake to 1,500 mg a day.  Limit alcohol intake to no more than 1 drink a day for nonpregnant women and 2 drinks a day for men. One drink equals 12 oz of beer, 5 oz of wine, or 1 oz of hard liquor.  Work with your health care provider to maintain a healthy body weight or to lose weight. Ask what an ideal weight is for you.  Get at least 30 minutes of exercise that causes your heart to beat faster (aerobic exercise) most days of the week. Activities may include walking, swimming, or biking.  Work with your health care provider or diet and nutrition specialist (dietitian) to adjust your eating plan to your individual calorie needs. Reading food labels   Check food labels for the amount of sodium per serving. Choose foods with less than 5 percent of the Daily Value of sodium. Generally, foods with less than 300 mg of sodium per serving fit into this eating plan.  To find whole grains, look for the word "whole" as the first word in the ingredient list. Shopping   Buy products labeled as "low-sodium" or "no salt added."  Buy fresh foods. Avoid canned foods and premade or frozen meals. Cooking   Avoid adding salt when cooking. Use salt-free seasonings or herbs instead of table salt or sea salt. Check with your health care provider or pharmacist before using salt substitutes.  Do not fry foods. Cook foods using healthy methods such as  baking, boiling, grilling, and broiling instead.  Cook with heart-healthy oils, such as olive, canola, soybean, or sunflower oil. Meal planning    Eat a balanced diet that includes:  5 or more servings of fruits and vegetables each day. At each meal, try to fill half of your plate with fruits and vegetables.  Up to 6-8 servings of whole grains each day.  Less than 6 oz of lean meat, poultry, or fish each day. A 3-oz serving of meat is about the same size as a deck of cards. One egg equals 1 oz.  2 servings of low-fat dairy each day.  A serving of nuts, seeds, or beans 5 times each week.  Heart-healthy fats. Healthy fats called Omega-3 fatty acids are found in foods such as flaxseeds and coldwater fish, like sardines, salmon, and mackerel.  Limit how much you eat of the following:  Canned or prepackaged foods.  Food that is high in trans fat, such as fried foods.  Food that is high in saturated fat, such as fatty meat.  Sweets, desserts, sugary drinks, and other foods with added sugar.  Full-fat dairy products.  Do not salt foods before eating.  Try to eat at least 2 vegetarian meals each week.  Eat more home-cooked food and less restaurant, buffet, and fast food.  When eating at a restaurant, ask that your food be prepared with less salt or no salt, if possible. What foods are recommended?  The items listed may not be a complete list. Talk with your dietitian about what dietary choices are best for you. Grains  Whole-grain or whole-wheat bread. Whole-grain or whole-wheat pasta. Brown rice. [place]. Bulgur. Whole-grain and low-sodium cereals. Pita bread. Low-fat, low-sodium crackers. Whole-wheat flour tortillas. Vegetables  Fresh or frozen vegetables (raw, steamed, roasted, or grilled). Low-sodium or reduced-sodium tomato and vegetable juice. Low-sodium or reduced-sodium tomato sauce and tomato paste. Low-sodium or reduced-sodium canned vegetables. Fruits  All  fresh, dried, or frozen fruit. Canned fruit in natural juice (without added sugar). Meat and other protein foods  Skinless chicken or [place]. Ground chicken or [place]. Pork with fat trimmed off. Fish and seafood. Egg whites. Dried beans, peas, or lentils. Unsalted nuts, nut butters, and seeds. Unsalted canned beans. Lean cuts of beef with fat trimmed off. Low-sodium, lean deli meat. Dairy  Low-fat (1%) or fat-free (skim) milk. Fat-free, low-fat, or reduced-fat cheeses. Nonfat, low-sodium ricotta or cottage cheese. Low-fat or nonfat yogurt. Low-fat, low-sodium cheese. Fats and oils  Soft margarine without trans fats. Vegetable oil. Low-fat, reduced-fat, or light mayonnaise and salad dressings (reduced-sodium). Canola, safflower, olive, soybean, and sunflower oils. Avocado. Seasoning and other foods  Herbs. Spices. Seasoning mixes without salt. Unsalted popcorn and pretzels. Fat-free sweets. What foods are not recommended? The items listed may not be a complete list. Talk with your dietitian about what dietary choices are best for you. Grains  Baked goods made with fat, such as croissants, muffins, or some breads. Dry pasta or rice meal packs. Vegetables  Creamed or fried vegetables. Vegetables in a cheese sauce. Regular canned vegetables (not low-sodium or reduced-sodium). Regular canned tomato sauce and paste (not low-sodium or reduced-sodium). Regular tomato and vegetable juice (not low-sodium or reduced-sodium). Angie Fava. Olives. Fruits  Canned fruit in a light or heavy syrup. Fried fruit. Fruit in cream or butter sauce. Meat and other protein foods  Fatty cuts of meat. Ribs. Fried meat. Berniece Salines. Sausage. Bologna and other processed lunch meats. Salami. Fatback. Hotdogs. Bratwurst. Salted nuts and seeds. Canned beans with added salt. Canned or smoked fish. Whole eggs or egg yolks. Chicken or [place] with skin. Dairy  Whole or 2% milk, cream, and half-and-half. Whole or full-fat cream cheese.  Whole-fat or sweetened yogurt. Full-fat cheese. Nondairy creamers. Whipped toppings. Processed cheese and cheese spreads. Fats and oils  Butter. Stick margarine. Lard. Shortening. Ghee. Bacon fat. Tropical oils, such as coconut, palm kernel, or palm oil. Seasoning and other foods  Salted popcorn and pretzels. Onion salt, garlic salt, seasoned salt, table salt, and sea salt. Worcestershire sauce. Tartar sauce. Barbecue sauce. Teriyaki sauce. Soy sauce, including reduced-sodium. Steak sauce. Canned and packaged gravies. Fish sauce. Oyster sauce. Cocktail sauce. Horseradish that you find on the shelf. Ketchup. Mustard. Meat flavorings and tenderizers. Bouillon cubes. Hot sauce and Tabasco sauce. Premade or packaged marinades. Premade or packaged taco seasonings. Relishes. Regular salad dressings. Where to find more information:  National Heart, Lung, and [place]: https://wilson-eaton.com/  American Heart Association: www.heart.org Summary  The DASH eating plan is a healthy eating plan that has been shown to reduce high blood pressure (hypertension). It may also reduce your risk for type 2 diabetes, heart disease, and stroke.  With the DASH eating plan, you should limit salt (sodium) intake to 2,300 mg a day. If you have hypertension, you may need to reduce your sodium intake to 1,500 mg a day.  When on the DASH eating plan, aim to eat more fresh fruits and vegetables, whole  grains, lean proteins, low-fat dairy, and heart-healthy fats.  Work with your health care provider or diet and nutrition specialist (dietitian) to adjust your eating plan to your individual calorie needs. This information is not intended to replace advice given to you by your health care provider. Make sure you discuss any questions you have with your health care provider. Document Released: 09/19/2011 Document Revised: 09/23/2016 Document Reviewed: 09/23/2016 Elsevier Interactive Patient Education  2017 [employer].

## 2017-02-19 NOTE — Progress Notes (Signed)
BP 126/74   Pulse 82   Temp 97.7 F (36.5 C) (Oral)   Resp 14   Wt 220 lb 8 oz (100 kg)   SpO2 95%   BMI 26.84 kg/m    Subjective:    Patient ID: Duane [name], male    DOB: 1951-01-25, 66 y.o.   MRN: [id number]  HPI: Duane [name] is a 66 y.o. male  Chief Complaint  Patient presents with  . Follow-up    3 month    HPI Patient is here with his wife for medicine follow-up; he is on a controlled substance (stimulant) and is seen every 3 months for that The ritalin definitely helps with alertness Does not necessarily feel tired through the day Not exhausted Sleeping okay Little bit of snoring, occasionally, not stopping breathing No chest pain, no palpitations, no headaches, no loss of appetite No energy drinks Hardly any coffee No decongestants  Saw the cardiologist; did the PFT too; has not heard back about that Did the EKG too she says, CXR and blood work because of amiodarone  He saw Dr. Holley Raring, did lots of bloodwork; no longer anemic  Depression screen [facility] 2/9 02/19/2017 12/26/2016 11/21/2016 08/21/2016 02/21/2016  Decreased Interest 0 0 0 0 0  Down, Depressed, Hopeless 0 0 0 0 0  PHQ - 2 Score 0 0 0 0 0   Relevant past medical, surgical, family and social history reviewed Past Medical History:  Diagnosis Date  . A-fib [facility])    requiring life-long anticouagulation  . Cerebellar infarct ([place])   . CKD (chronic kidney disease)    creatinine baseline 1.3-1.5 in 2015  . GERD (gastroesophageal reflux disease)   . Gout   . Graves disease   . H/O hyperkalemia    with Bactrim  . Hyperlipidemia    requiring life long statin therapy  . Hypertension   . Hypothyroidism   . Left hemiparesis [facility]) May 2015   s/p strke  . Poor short term memory   . Renal insufficiency   . Restrictive lung disease    mild  . Stroke ([place]) 02/20/14   left side hemiparesis, dysphagia  . UTI (urinary tract infection) due to Enterococcus May 2015  . Vitamin D deficiency  disease    Past Surgical History:  Procedure Laterality Date  . CATARACT EXTRACTION W/PHACO Right 08/27/2016   Procedure: CATARACT EXTRACTION PHACO AND INTRAOCULAR LENS PLACEMENT (IOC);  Surgeon: Eulogio Bear, MD;  Location: [place];  Service: Ophthalmology;  Laterality: Right;  RIGHT  . ENTEROSTOMY CLOSURE  11/27/15   ileostomy takedown  . EXPLORATORY LAPAROTOMY  11/27/15  . illeostomy  07/2014  . POLYPECTOMY  10/15   GI surgery to remove polyp   Family History  Problem Relation Age of Onset  . Arthritis Mother   . Heart attack Father   . Heart disease Father   . Cirrhosis Sister    Social History   Social History  . Marital status: Married    Spouse name: N/A  . Number of children: N/A  . Years of education: N/A   Occupational History  . Not on file.   Social History Main Topics  . Smoking status: Never Smoker  . Smokeless tobacco: Never Used  . Alcohol use No  . Drug use: No  . Sexual activity: Not on file   Other Topics Concern  . Not on file   Social History Narrative  . No narrative on file    Interim medical  history since last visit reviewed. Allergies and medications reviewed  Review of Systems Per HPI unless specifically indicated above     Objective:    BP 126/74   Pulse 82   Temp 97.7 F (36.5 C) (Oral)   Resp 14   Wt 220 lb 8 oz (100 kg)   SpO2 95%   BMI 26.84 kg/m   Wt Readings from Last 3 Encounters:  02/19/17 220 lb 8 oz (100 kg)  12/26/16 223 lb (101.2 kg)  11/21/16 224 lb 4.8 oz (101.7 kg)    Physical Exam  Constitutional: He appears well-developed and well-nourished. No distress.  HENT:  Mouth/Throat: Mucous membranes are normal.  Cardiovascular: Normal rate.   Pulmonary/Chest: Effort normal and breath sounds normal. No respiratory distress. He has no decreased breath sounds.  Neurological: He is alert.  Psychiatric: His affect is blunt. He is not agitated, not aggressive and not combative.  Fair eye contact  with examiner; rather blunted affect; not as irritated as at previous visits       Assessment & Plan:   Problem List Items Addressed This Visit      Genitourinary   Anemia, chronic renal failure    Evaluated, managed by nephrologist; apparently "resolved" per patient's wife; will obviously need to be followed since he has stage IV CKD        Other   On stimulant medication    Blood pressure and pulse stable; [place] web site reviewed; no red flags; he has one Rx available at pharmacy, and I provided two more prescriptions with appropriate fill on or after dates      Depression due to old stroke - Primary (Chronic)    Continue medicine, SSRI plus ritalin; no adverse effects; no red flags; refills provided          Follow up plan: Return in about 3 months (around 05/22/2017) for twenty minute follow-up with fasting labs.  An after-visit summary was printed and given to the patient at [place].  Please see the patient instructions which may contain other information and recommendations beyond what is mentioned above in the assessment and plan.  Meds ordered this encounter  Medications  . methylphenidate (RITALIN) 5 MG tablet    Sig: Take 1 tablet (5 mg total) by mouth 2 (two) times daily with breakfast and lunch. If needed for attention or focus; fill on or after March 21, 2017    Dispense:  60 tablet    Refill:  0  . methylphenidate (RITALIN) 5 MG tablet    Sig: Take 1 tablet (5 mg total) by mouth 2 (two) times daily with breakfast and lunch. If needed for attention or focus; fill on or after April 20, 2017    Dispense:  60 tablet    Refill:  0    No orders of the defined types were placed in this encounter.

## 2017-02-22 DIAGNOSIS — Z79899 Other long term (current) drug therapy: Secondary | ICD-10-CM | POA: Insufficient documentation

## 2017-02-22 NOTE — Assessment & Plan Note (Signed)
Continue medicine, SSRI plus ritalin; no adverse effects; no red flags; refills provided

## 2017-02-22 NOTE — Assessment & Plan Note (Signed)
Blood pressure and pulse stable; [place] web site reviewed; no red flags; he has one Rx available at pharmacy, and I provided two more prescriptions with appropriate fill on or after dates

## 2017-02-22 NOTE — Assessment & Plan Note (Addendum)
Evaluated, managed by nephrologist; apparently "resolved" per patient's wife; will obviously need to be followed since he has stage IV CKD

## 2017-03-13 DIAGNOSIS — R69 Illness, unspecified: Secondary | ICD-10-CM | POA: Diagnosis not present

## 2017-03-24 DIAGNOSIS — M1A00X0 Idiopathic chronic gout, unspecified site, without tophus (tophi): Secondary | ICD-10-CM | POA: Diagnosis not present

## 2017-04-07 ENCOUNTER — Other Ambulatory Visit: Payer: Self-pay | Admitting: Family Medicine

## 2017-04-07 DIAGNOSIS — E7800 Pure hypercholesterolemia, unspecified: Secondary | ICD-10-CM

## 2017-04-11 ENCOUNTER — Other Ambulatory Visit: Payer: Self-pay | Admitting: Family Medicine

## 2017-04-11 DIAGNOSIS — E7800 Pure hypercholesterolemia, unspecified: Secondary | ICD-10-CM

## 2017-04-11 NOTE — Telephone Encounter (Signed)
Spoke to Dr. Manuella Ghazi regarding this pt and he stated to have the pt come in when they get back form vacation and get fasting labs done and call in a 10 day supply of Lipitor. [place] and spoke with scott, pharmacist,  verbal approved by Dr. Manuella Ghazi.

## 2017-04-11 NOTE — Telephone Encounter (Signed)
Last lipid was done in November, it looked like the refill request was denied. Pt wife states he is out  Of Lipitor and they will be leaving out town and will not be back in till July 9th. Pt wife will bring him in to have a fasting labs done that week.

## 2017-04-11 NOTE — Telephone Encounter (Signed)
Pt would like a call back

## 2017-04-21 DIAGNOSIS — R69 Illness, unspecified: Secondary | ICD-10-CM | POA: Diagnosis not present

## 2017-05-12 ENCOUNTER — Other Ambulatory Visit: Payer: Self-pay

## 2017-05-12 DIAGNOSIS — E7800 Pure hypercholesterolemia, unspecified: Secondary | ICD-10-CM

## 2017-05-12 MED ORDER — ATORVASTATIN CALCIUM 20 MG PO TABS: 20.0000 mg | ORAL_TABLET | Freq: Every day | ORAL | 0 refills | Status: DC

## 2017-05-12 NOTE — Telephone Encounter (Signed)
Patient has an appt already for next Friday will have chol checked then?

## 2017-05-12 NOTE — Telephone Encounter (Signed)
Yes, we'll get fasting labs at his visit; thank you

## 2017-05-23 ENCOUNTER — Encounter: Payer: Self-pay | Admitting: Family Medicine

## 2017-05-23 ENCOUNTER — Ambulatory Visit (INDEPENDENT_AMBULATORY_CARE_PROVIDER_SITE_OTHER): Payer: Medicare HMO | Admitting: Family Medicine

## 2017-05-23 VITALS — BP 124/74 | HR 77 | Temp 98.0°F | Resp 14 | Wt 208.3 lb

## 2017-05-23 DIAGNOSIS — F0631 Mood disorder due to known physiological condition with depressive features: Secondary | ICD-10-CM | POA: Diagnosis not present

## 2017-05-23 DIAGNOSIS — R748 Abnormal levels of other serum enzymes: Secondary | ICD-10-CM | POA: Diagnosis not present

## 2017-05-23 DIAGNOSIS — N184 Chronic kidney disease, stage 4 (severe): Secondary | ICD-10-CM | POA: Diagnosis not present

## 2017-05-23 DIAGNOSIS — R69 Illness, unspecified: Secondary | ICD-10-CM | POA: Diagnosis not present

## 2017-05-23 DIAGNOSIS — Z79899 Other long term (current) drug therapy: Secondary | ICD-10-CM | POA: Diagnosis not present

## 2017-05-23 DIAGNOSIS — D631 Anemia in chronic kidney disease: Secondary | ICD-10-CM | POA: Diagnosis not present

## 2017-05-23 DIAGNOSIS — I69398 Other sequelae of cerebral infarction: Secondary | ICD-10-CM | POA: Diagnosis not present

## 2017-05-23 DIAGNOSIS — I480 Paroxysmal atrial fibrillation: Secondary | ICD-10-CM | POA: Diagnosis not present

## 2017-05-23 DIAGNOSIS — G8194 Hemiplegia, unspecified affecting left nondominant side: Secondary | ICD-10-CM

## 2017-05-23 DIAGNOSIS — Z125 Encounter for screening for malignant neoplasm of prostate: Secondary | ICD-10-CM

## 2017-05-23 DIAGNOSIS — E782 Mixed hyperlipidemia: Secondary | ICD-10-CM

## 2017-05-23 DIAGNOSIS — E890 Postprocedural hypothyroidism: Secondary | ICD-10-CM | POA: Diagnosis not present

## 2017-05-23 DIAGNOSIS — M1A30X0 Chronic gout due to renal impairment, unspecified site, without tophus (tophi): Secondary | ICD-10-CM

## 2017-05-23 LAB — COMPLETE METABOLIC PANEL WITH GFR
ALBUMIN: 3.5 g/dL — AB (ref 3.6–5.1)
ALK PHOS: 157 U/L — AB (ref 40–115)
ALT: 42 U/L (ref 9–46)
AST: 42 U/L — ABNORMAL HIGH (ref 10–35)
BILIRUBIN TOTAL: 0.7 mg/dL (ref 0.2–1.2)
BUN: 29 mg/dL — ABNORMAL HIGH (ref 7–25)
CALCIUM: 8.9 mg/dL (ref 8.6–10.3)
CHLORIDE: 106 mmol/L (ref 98–110)
CO2: 24 mmol/L (ref 20–32)
CREATININE: 2.58 mg/dL — AB (ref 0.70–1.25)
GFR, EST AFRICAN AMERICAN: 29 mL/min — AB (ref >=60)
GFR, Est Non African American: 25 mL/min — ABNORMAL LOW (ref >=60)
Glucose, Bld: 85 mg/dL (ref 65–99)
Potassium: 4.9 mmol/L (ref 3.5–5.3)
Sodium: 140 mmol/L (ref 135–146)
Total Protein: 6.2 g/dL (ref 6.1–8.1)

## 2017-05-23 LAB — LIPID PANEL
Cholesterol: 197 mg/dL (ref <200)
HDL: 42 mg/dL (ref >40)
LDL Cholesterol: 129 mg/dL — ABNORMAL HIGH (ref <100)
TRIGLYCERIDES: 131 mg/dL (ref <150)
Total CHOL/HDL Ratio: 4.7 Ratio (ref <5.0)
VLDL: 26 mg/dL (ref <30)

## 2017-05-23 LAB — TSH: TSH: 0.19 m[IU]/L — AB (ref 0.40–4.50)

## 2017-05-23 MED ORDER — METHYLPHENIDATE HCL 5 MG PO TABS: 5.0000 mg | ORAL_TABLET | Freq: Two times a day (BID) | ORAL | 0 refills | Status: DC

## 2017-05-23 NOTE — Assessment & Plan Note (Signed)
Check PSA; no sx

## 2017-05-23 NOTE — Assessment & Plan Note (Signed)
Monitored by nephrologist 

## 2017-05-23 NOTE — Progress Notes (Signed)
BP 124/74   Pulse 77   Temp 98 F (36.7 C) (Oral)   Resp 14   Wt 208 lb 4.8 oz (94.5 kg)   SpO2 95%   BMI 25.36 kg/m    Subjective:    Patient ID: Duane [name], male    DOB: July 31, 1951, 66 y.o.   MRN: [id number]  HPI: Duane [name] is a 66 y.o. male  Chief Complaint  Patient presents with  . Follow-up     3 month    HPI Hypothyroidism; not eating as much; appetite fair; lost 12 pounds; no hair loss or dry skin; moving bowels okay  Chronic gout; no flares since hospitalization; still eating the same food; the allopurinol was added and keeping it at [place]; not eating organ meats any more, avoiding shrimp  Hx of stroke; no new symptoms; left side not as strong as right; no falls or tripping  Atrial fib; not conscious of fluttering; no chest pain; no bleeding on anticoagulant  Elevated alk phos; no abdominal pain; no bone pain  Builds up ear wax; would like those checked  Stage 4 CKD; sees Dr. Holley Raring; avoiding NSAIDs; not enough water  High cholesterol; limiting fatty meats; two to four eggs a week; mostly cereal in the mornings  Stimulant medicine; morning dose daily and afternoon dose occasionally; does not feel jittery or revved up  Depression screen [facility] 2/9 05/23/2017 02/19/2017 12/26/2016 11/21/2016 08/21/2016  Decreased Interest 0 0 0 0 0  Down, Depressed, Hopeless 0 0 0 0 0  PHQ - 2 Score 0 0 0 0 0    Relevant past medical, surgical, family and social history reviewed Past Medical History:  Diagnosis Date  . A-fib [facility])    requiring life-long anticouagulation  . Cerebellar infarct ([place])   . CKD (chronic kidney disease)    creatinine baseline 1.3-1.5 in 2015  . GERD (gastroesophageal reflux disease)   . Gout   . Graves disease   . H/O hyperkalemia    with Bactrim  . Hyperlipidemia    requiring life long statin therapy  . Hypertension   . Hypothyroidism   . Left hemiparesis [facility]) May 2015   s/p strke  . Poor short term memory   . Renal  insufficiency   . Restrictive lung disease    mild  . Stroke ([place]) 02/20/14   left side hemiparesis, dysphagia  . UTI (urinary tract infection) due to Enterococcus May 2015  . Vitamin D deficiency disease    Past Surgical History:  Procedure Laterality Date  . CATARACT EXTRACTION W/PHACO Right 08/27/2016   Procedure: CATARACT EXTRACTION PHACO AND INTRAOCULAR LENS PLACEMENT (IOC);  Surgeon: Eulogio Bear, MD;  Location: [place];  Service: Ophthalmology;  Laterality: Right;  RIGHT  . ENTEROSTOMY CLOSURE  11/27/15   ileostomy takedown  . EXPLORATORY LAPAROTOMY  11/27/15  . illeostomy  07/2014  . POLYPECTOMY  10/15   GI surgery to remove polyp   Family History  Problem Relation Age of Onset  . Arthritis Mother   . Heart attack Father   . Heart disease Father   . Cirrhosis Sister    Social History   Social History  . Marital status: Married    Spouse name: N/A  . Number of children: N/A  . Years of education: N/A   Occupational History  . Not on file.   Social History Main Topics  . Smoking status: Never Smoker  . Smokeless tobacco: Never Used  . Alcohol use  No  . Drug use: No  . Sexual activity: Not Currently   Other Topics Concern  . Not on file   Social History Narrative  . No narrative on file    Interim medical history since last visit reviewed. Allergies and medications reviewed  Review of Systems Per HPI unless specifically indicated above     Objective:    BP 124/74   Pulse 77   Temp 98 F (36.7 C) (Oral)   Resp 14   Wt 208 lb 4.8 oz (94.5 kg)   SpO2 95%   BMI 25.36 kg/m   Wt Readings from Last 3 Encounters:  05/23/17 208 lb 4.8 oz (94.5 kg)  02/19/17 220 lb 8 oz (100 kg)  12/26/16 223 lb (101.2 kg)    Physical Exam  Constitutional: He appears well-developed and well-nourished. No distress.  Weight loss noted  HENT:  Mouth/Throat: Mucous membranes are normal. Mucous membranes are not dry.  Some cerumen removed from both  EACs without incident  Cardiovascular: Normal rate and regular rhythm.   No extrasystoles are present.  Sounds to be in NSR today  Pulmonary/Chest: Effort normal and breath sounds normal. No respiratory distress. He has no decreased breath sounds.  Abdominal: He exhibits no distension. There is no tenderness.  Neurological: He is alert. He displays no tremor.  Skin:  Scratches on the RIGHT forearm consistent with scratches from dog as reported; mild erythema; no drainage  Psychiatric: His mood appears not anxious. His affect is blunt. His speech is not delayed. He is withdrawn. He is not agitated, not aggressive and not slowed. He does not exhibit a depressed mood.  Fair eye contact with examiner; rather blunted affect; not as irritated as at previous visits He is attentive.    Results for orders placed or performed in visit on 12/02/16  Alkaline Phosphatase Isoenzymes  Result Value Ref Range   Alkaline Phonsphatase 175 (H) 40 - 115 U/L   Intestinal Isoenzymes 0 (L) 1 - 24 %   Bone Isoenzymes 42 28 - 66 %   Liver Isoenzymes 58 25 - 69 %      Assessment & Plan:   Problem List Items Addressed This Visit      Cardiovascular and Mediastinum   A-fib (HCC)    anticoagulation        Endocrine   Hypothyroidism following radioiodine therapy (Chronic)    Check TSH with weight loss      Relevant Orders   TSH     Nervous and Auditory   Left hemiparesis (HCC) - Primary    No falls, not tripping        Musculoskeletal and Integument   Chronic gout due to renal impairment    Check uric acid; avoid triggers foods      Relevant Orders   Uric acid     Genitourinary   Chronic kidney disease, stage IV (severe) (HCC) (Chronic)    Managed by nephrologist      Anemia, chronic renal failure    Monitored by nephrologist        Other   Prostate cancer screening    Check PSA; no sx      Relevant Orders   PSA   On stimulant medication    Monitored here; more alert during  day; wife wishes to continue      Hyperlipidemia    Check lipids today; avoid saturated fats      Relevant Orders   Lipid panel   Depression due  to old stroke (Chronic)    Continue meds      Controlled substance agreement signed    Contract signed today; reviewed [place] web site; 4 diazepam for dental procedure note, allowed; not interfering with stimulant; UDS collected today per protocol; no concern about misuse or diversion      Alkaline phosphatase elevation    Check levels      Relevant Orders   COMPLETE METABOLIC PANEL WITH GFR   Alkaline Phosphatase Isoenzymes       Follow up plan: Return in about 3 months (around 08/23/2017) for follow-up visit with Dr. Sanda Klein.  An after-visit summary was printed and given to the patient at [place].  Please see the patient instructions which may contain other information and recommendations beyond what is mentioned above in the assessment and plan.  Meds ordered this encounter  Medications  . methylphenidate (RITALIN) 5 MG tablet    Sig: Take 1 tablet (5 mg total) by mouth 2 (two) times daily with breakfast and lunch. If needed for attention or focus; fill on or after Sept 9, 2018    Dispense:  60 tablet    Refill:  0  . methylphenidate (RITALIN) 5 MG tablet    Sig: Take 1 tablet (5 mg total) by mouth 2 (two) times daily with breakfast and lunch. If needed for attention or focus    Dispense:  60 tablet    Refill:  0    Orders Placed This Encounter  Procedures  . Uric acid  . Lipid panel  . COMPLETE METABOLIC PANEL WITH GFR  . TSH  . Alkaline Phosphatase Isoenzymes  . PSA

## 2017-05-23 NOTE — Assessment & Plan Note (Signed)
Check uric acid; avoid triggers foods

## 2017-05-23 NOTE — Assessment & Plan Note (Signed)
No falls, not tripping

## 2017-05-23 NOTE — Assessment & Plan Note (Signed)
Check lipids today; avoid saturated fats 

## 2017-05-23 NOTE — Assessment & Plan Note (Signed)
Check levels 

## 2017-05-23 NOTE — Assessment & Plan Note (Signed)
anticoagulation

## 2017-05-23 NOTE — Patient Instructions (Addendum)
Keep a close eye on the wounds on the arm, use antibacterial ointment Tetanus is up-to-date, 2017 Try to limit saturated fats in your diet (bologna, hot dogs, barbeque, cheeseburgers, hamburgers, steak, bacon, sausage, cheese, etc.) and get more fresh fruits, vegetables, and whole grains He received the PCV-13 (Prevnar) in March of 2018 He can get the PPSV-23 (Pneumovax) in March or April or later in 2019

## 2017-05-23 NOTE — Assessment & Plan Note (Signed)
Continue meds. 

## 2017-05-23 NOTE — Assessment & Plan Note (Signed)
Contract signed today; reviewed [place] web site; 4 diazepam for dental procedure note, allowed; not interfering with stimulant; UDS collected today per protocol; no concern about misuse or diversion

## 2017-05-23 NOTE — Assessment & Plan Note (Signed)
Monitored here; more alert during day; wife wishes to continue

## 2017-05-23 NOTE — Assessment & Plan Note (Signed)
Managed by nephrologist 

## 2017-05-23 NOTE — Assessment & Plan Note (Signed)
Check TSH with weight loss

## 2017-05-24 LAB — PSA: PSA: 0.3 ng/mL (ref <=4.0)

## 2017-05-24 LAB — URIC ACID: URIC ACID, SERUM: 4.9 mg/dL (ref 4.0–8.0)

## 2017-05-27 LAB — ALKALINE PHOSPHATASE ISOENZYMES
ALKALINE PHOSPHATASE (ALP ISO): 152 U/L — AB (ref 40–115)
Bone Isoenzymes: 37 % (ref 28–66)
Intestinal Isoenzymes: 1 % (ref 1–24)
Liver Isoenzymes: 62 % (ref 25–69)

## 2017-05-28 ENCOUNTER — Encounter: Payer: Self-pay | Admitting: Family Medicine

## 2017-05-28 ENCOUNTER — Other Ambulatory Visit: Payer: Self-pay | Admitting: Family Medicine

## 2017-05-28 DIAGNOSIS — R748 Abnormal levels of other serum enzymes: Secondary | ICD-10-CM

## 2017-05-28 MED ORDER — LEVOTHYROXINE SODIUM 88 MCG PO TABS
88.0000 ug | ORAL_TABLET | Freq: Every day | ORAL | 3 refills | Status: DC
Start: 2017-05-28 — End: 2018-05-24

## 2017-05-28 MED ORDER — ATORVASTATIN CALCIUM 40 MG PO TABS: 40.0000 mg | ORAL_TABLET | Freq: Every day | ORAL | 1 refills | Status: DC

## 2017-05-28 NOTE — Progress Notes (Signed)
Add on ANA, ASMA, AMA

## 2017-05-30 ENCOUNTER — Other Ambulatory Visit: Payer: Self-pay

## 2017-05-30 DIAGNOSIS — R748 Abnormal levels of other serum enzymes: Secondary | ICD-10-CM

## 2017-06-04 DIAGNOSIS — N184 Chronic kidney disease, stage 4 (severe): Secondary | ICD-10-CM | POA: Diagnosis not present

## 2017-06-04 DIAGNOSIS — D631 Anemia in chronic kidney disease: Secondary | ICD-10-CM | POA: Diagnosis not present

## 2017-06-04 DIAGNOSIS — M109 Gout, unspecified: Secondary | ICD-10-CM | POA: Diagnosis not present

## 2017-06-04 DIAGNOSIS — E210 Primary hyperparathyroidism: Secondary | ICD-10-CM | POA: Diagnosis not present

## 2017-06-05 ENCOUNTER — Other Ambulatory Visit: Payer: Self-pay | Admitting: Family Medicine

## 2017-06-05 DIAGNOSIS — E7800 Pure hypercholesterolemia, unspecified: Secondary | ICD-10-CM

## 2017-06-06 DIAGNOSIS — R748 Abnormal levels of other serum enzymes: Secondary | ICD-10-CM | POA: Diagnosis not present

## 2017-06-06 NOTE — Telephone Encounter (Signed)
Has been notified by the pharmacy that he has enough medication.

## 2017-06-06 NOTE — Telephone Encounter (Signed)
I just received a refill request for 20 mg atorvastatin He should be on 40 mg atorvastatin and that Rx was sent in already; thank you

## 2017-06-09 LAB — ANA,IFA RA DIAG PNL W/RFLX TIT/PATN: Anti Nuclear Antibody(ANA): POSITIVE — AB

## 2017-06-09 LAB — ANTI-NUCLEAR AB-TITER (ANA TITER): ANA Titer 1: 1:160 {titer} — ABNORMAL HIGH

## 2017-06-09 LAB — MITOCHONDRIAL ANTIBODIES

## 2017-06-10 LAB — ANTI-SMOOTH MUSCLE ANTIBODY, IGG: Smooth Muscle Ab: <20 U (ref <20)

## 2017-06-12 ENCOUNTER — Other Ambulatory Visit: Payer: Self-pay

## 2017-06-12 DIAGNOSIS — R748 Abnormal levels of other serum enzymes: Secondary | ICD-10-CM

## 2017-06-12 DIAGNOSIS — R768 Other specified abnormal immunological findings in serum: Secondary | ICD-10-CM

## 2017-06-23 ENCOUNTER — Encounter: Payer: Self-pay | Admitting: Gastroenterology

## 2017-06-23 ENCOUNTER — Encounter (INDEPENDENT_AMBULATORY_CARE_PROVIDER_SITE_OTHER): Payer: Self-pay

## 2017-06-23 ENCOUNTER — Ambulatory Visit (INDEPENDENT_AMBULATORY_CARE_PROVIDER_SITE_OTHER): Payer: Medicare HMO | Admitting: Gastroenterology

## 2017-06-23 VITALS — BP 106/64 | HR 67 | Temp 98.1°F | Ht 77.0 in | Wt 204.6 lb

## 2017-06-23 DIAGNOSIS — R945 Abnormal results of liver function studies: Secondary | ICD-10-CM

## 2017-06-23 DIAGNOSIS — M7550 Bursitis of unspecified shoulder: Secondary | ICD-10-CM | POA: Insufficient documentation

## 2017-06-23 DIAGNOSIS — M719 Bursopathy, unspecified: Secondary | ICD-10-CM | POA: Insufficient documentation

## 2017-06-23 DIAGNOSIS — R7989 Other specified abnormal findings of blood chemistry: Secondary | ICD-10-CM

## 2017-06-23 NOTE — Progress Notes (Signed)
Jonathon Bellows MD, MRCP(U.K) [place]  [place], [place] [address]  Main: [phone]  Fax: [phone]   Gastroenterology Consultation  Referring Provider:     Arnetha Courser, MD Primary Care Physician:  Arnetha Courser, MD Primary Gastroenterologist:  Dr. Jonathon Bellows  Reason for Consultation:     Abnormal LFT's         HPI:   Duane [name] is a 66 y.o. y/o male referred for consultation & management  by Dr. Sanda Klein, Satira Anis, MD.    He has been referred for abnormal LFT's. H/o hypothyroidism , CVA, A fibb ,CKD 4 . Noted by Dr Sanda Klein to have an elevated alkaline phosphotase with normal isolated fractions. ANA is positive(1:160)  a few weeks back but was negative a year back. Smooth muscle antibody,AMA was negative. USG abdomen has been ordered   No liver disease in the family, does not drink any alcohol., no illegal drug use. No tatoos. Denies any fractures. No over the counter meds, no herbal supplements.  Past Medical History:  Diagnosis Date  . A-fib [facility])    requiring life-long anticouagulation  . Cerebellar infarct ([place])   . CKD (chronic kidney disease)    creatinine baseline 1.3-1.5 in 2015  . GERD (gastroesophageal reflux disease)   . Gout   . Graves disease   . H/O hyperkalemia    with Bactrim  . Hyperlipidemia    requiring life long statin therapy  . Hypertension   . Hypothyroidism   . Left hemiparesis [facility]) May 2015   s/p strke  . Poor short term memory   . Renal insufficiency   . Restrictive lung disease    mild  . Stroke ([place]) 02/20/14   left side hemiparesis, dysphagia  . UTI (urinary tract infection) due to Enterococcus May 2015  . Vitamin D deficiency disease     Past Surgical History:  Procedure Laterality Date  . CATARACT EXTRACTION W/PHACO Right 08/27/2016   Procedure: CATARACT EXTRACTION PHACO AND INTRAOCULAR LENS PLACEMENT (IOC);  Surgeon: Eulogio Bear, MD;  Location: [place];  Service: Ophthalmology;   Laterality: Right;  RIGHT  . ENTEROSTOMY CLOSURE  11/27/15   ileostomy takedown  . EXPLORATORY LAPAROTOMY  11/27/15  . illeostomy  07/2014  . POLYPECTOMY  10/15   GI surgery to remove polyp    Prior to Admission medications   Medication Sig Start Date End Date Taking? Authorizing Provider  allopurinol (ZYLOPRIM) 300 MG tablet Take 300 mg by mouth daily.    [provider]  amiodarone (PACERONE) 200 MG tablet Take 200 mg by mouth daily.    [provider]  apixaban (ELIQUIS) 5 MG TABS tablet Take 1 tablet (5 mg total) by mouth 2 (two) times daily. 08/29/16   Arnetha Courser, MD  atorvastatin (LIPITOR) 20 MG tablet  05/13/17   [provider]  atorvastatin (LIPITOR) 40 MG tablet Take 1 tablet (40 mg total) by mouth at bedtime. For cholesterol 05/28/17   Arnetha Courser, MD  calcitRIOL (ROCALTROL) 0.25 MCG capsule  05/26/17   [provider]  Cholecalciferol (VITAMIN D3) 2000 units capsule Take 2,000 Units by mouth daily.    [provider]  diazepam (VALIUM) 5 MG tablet  05/27/17   [provider]  doxazosin (CARDURA) 2 MG tablet Take 0.5 tablets (1 mg total) by mouth daily. 11/21/16   Arnetha Courser, MD  famotidine (PEPCID) 20 MG tablet TAKE 1 TABLET BY MOUTH TWICE  DAILY 04/15/17   Lada, Satira Anis, MD  ferrous sulfate 325 (65 FE) MG tablet Take 325 mg by mouth daily with breakfast.    [provider]  FLUoxetine (PROZAC) 40 MG capsule TAKE ONE CAPSULE BY MOUTH ONCE DAILY 11/19/16   Arnetha Courser, MD  levothyroxine (SYNTHROID, LEVOTHROID) 100 MCG tablet  05/13/17   [provider]  levothyroxine (SYNTHROID, LEVOTHROID) 88 MCG tablet Take 1 tablet (88 mcg total) by mouth daily. 05/28/17   Arnetha Courser, MD  methylphenidate (RITALIN) 5 MG tablet Take 1 tablet (5 mg total) by mouth 2 (two) times daily with breakfast and lunch. If needed for attention or focus; fill on or after Sept 9, 2018 05/23/17   Arnetha Courser, MD    methylphenidate (RITALIN) 5 MG tablet Take 1 tablet (5 mg total) by mouth 2 (two) times daily with breakfast and lunch. If needed for attention or focus 05/23/17   Arnetha Courser, MD  mirtazapine (REMERON) 15 MG tablet TAKE ONE TABLET BY MOUTH AT BEDTIME 04/15/17   Lada, Satira Anis, MD    Family History  Problem Relation Age of Onset  . Arthritis Mother   . Heart attack Father   . Heart disease Father   . Cirrhosis Sister      Social History  Substance Use Topics  . Smoking status: Never Smoker  . Smokeless tobacco: Never Used  . Alcohol use No    Allergies as of 06/23/2017 - Review Complete 05/23/2017  Allergen Reaction Noted  . Bactrim [sulfamethoxazole-trimethoprim] Other (See Comments) 05/04/2015  . Chlorpromazine Other (See Comments) 09/15/2015  . Cyclobenzaprine Other (See Comments) 01/10/2016  . Oxycodone Other (See Comments) 01/10/2016    Review of Systems:    All systems reviewed and negative except where noted in HPI.   Physical Exam:  BP 106/64 (BP Location: Right Arm, Patient Position: Sitting, Cuff Size: Large)   Pulse 67   Temp 98.1 F (36.7 C) (Oral)   Ht 6\' 5"  (1.956 m)   Wt 204 lb 9.6 oz (92.8 kg)   BMI 24.26 kg/m  No LMP for male patient. Psych:  Alert and cooperative. Normal mood and affect. General:   Alert,  Well-developed, well-nourished, pleasant and cooperative in NAD Head:  Normocephalic and atraumatic. Eyes:  Sclera clear, no icterus.   Conjunctiva pink. Ears:  Normal auditory acuity. Nose:  No deformity, discharge, or lesions. Mouth:  No deformity or lesions,oropharynx pink & moist. Neck:  Supple; no masses or thyromegaly. Lungs:  Respirations even and unlabored.  Clear throughout to auscultation.   No wheezes, crackles, or rhonchi. No acute distress. Heart:  Regular rate and rhythm; no murmurs, clicks, rubs, or gallops. Abdomen:  Normal bowel sounds.  No bruits.  Soft, non-tender and non-distended without masses, hepatosplenomegaly or  hernias noted.  No guarding or rebound tenderness.    Extremities:  No clubbing or edema.  No cyanosis.Healing laceration over left arm  Neurologic:  Alert and oriented x3;  grossly normal neurologically. Skin:  Intact without significant lesions or rashes. No jaundice. Lymph Nodes:  No significant cervical adenopathy. Psych:  Alert and cooperative. Normal mood and affect.  Imaging Studies: No results found.  CMP Latest Ref Rng & Units 05/23/2017 11/21/2016 11/21/2016  Glucose 65 - 99 mg/dL 85 - -  BUN 7 - 25 mg/dL 29(H) - -  Creatinine 0.70 - 1.25 mg/dL 2.58(H) - -  Sodium 135 - 146 mmol/L 140 - -  Potassium 3.5 - 5.3 mmol/L 4.9 - -  Chloride 98 - 110 mmol/L 106 - -  CO2 20 - 32 mmol/L 24 - -  Calcium 8.6 - 10.3 mg/dL 8.9 - -  Total Protein 6.1 - 8.1 g/dL 6.2 - 6.4  Total Bilirubin 0.2 - 1.2 mg/dL 0.7 - 0.5  Alkaline Phos 40 - 115 U/L 157(H) 185(H) 179(H)  AST 10 - 35 U/L 42(H) - 48(H)  ALT 9 - 46 U/L 42 - 46    Assessment and Plan:   Duane [name] is a 66 y.o. y/o male has been referred for elevated alkaline phosphotase and a positive ANA. Smooth muscle antibody is negative. Wide differential at this point. ANA is non specific but can also be seen in autoimmune hepatitis. I will rule out all other causes such as viral hepatitis, obstruction , hyperparathyroidism from low vitamin D due to CKD . If all negative then will have to consider a MRCP +/-  liver bx.   Follow up in 4 weeks   Dr Jonathon Bellows MD,MRCP(U.K)

## 2017-06-24 ENCOUNTER — Telehealth: Payer: Self-pay

## 2017-06-24 ENCOUNTER — Other Ambulatory Visit: Payer: Self-pay

## 2017-06-24 DIAGNOSIS — R7989 Other specified abnormal findings of blood chemistry: Secondary | ICD-10-CM

## 2017-06-24 DIAGNOSIS — R945 Abnormal results of liver function studies: Secondary | ICD-10-CM

## 2017-06-24 NOTE — Telephone Encounter (Signed)
Advised patient of ultrasound scheduled for 9/17 @ 830am. NPO midnight. Duane [name]

## 2017-06-25 ENCOUNTER — Ambulatory Visit: Payer: Medicare HMO | Admitting: Gastroenterology

## 2017-06-26 LAB — IMMUNOGLOBULINS A/E/G/M, SERUM
IGG (IMMUNOGLOBIN G), SERUM: 1096 mg/dL (ref 700–1600)
IGM (IMMUNOGLOBULIN M), SRM: 38 mg/dL (ref 20–172)
IgE (Immunoglobulin E), Serum: 10 IU/mL (ref 0–100)

## 2017-06-26 LAB — HEPATITIS C ANTIBODY

## 2017-06-26 LAB — VITAMIN D 1,25 DIHYDROXY
VITAMIN D3 1, 25 (OH): 29 pg/mL
Vitamin D 1, 25 (OH)2 Total: 29 pg/mL
Vitamin D2 1, 25 (OH)2: <10 pg/mL

## 2017-06-26 LAB — ALPHA-1-ANTITRYPSIN: A1 ANTITRYPSIN: 149 mg/dL (ref 90–200)

## 2017-06-26 LAB — CERULOPLASMIN: Ceruloplasmin: 24 mg/dL (ref 16.0–31.0)

## 2017-06-26 LAB — HEPATITIS B SURFACE ANTIGEN: Hepatitis B Surface Ag: NEGATIVE

## 2017-06-26 LAB — HIV ANTIBODY (ROUTINE TESTING W REFLEX): HIV Screen 4th Generation wRfx: NONREACTIVE

## 2017-06-26 LAB — CELIAC PANEL 10
Antigliadin Abs, IgA: 6 units (ref 0–19)
ENDOMYSIAL IGA: NEGATIVE
Gliadin IgG: 3 units (ref 0–19)
IGA/IMMUNOGLOBULIN A, SERUM: 244 mg/dL (ref 61–437)
Transglutaminase IgA: <2 U/mL (ref 0–3)

## 2017-06-26 LAB — ANTI-MICROSOMAL ANTIBODY LIVER / KIDNEY: LKM1 Ab: 2.6 Units (ref 0.0–20.0)

## 2017-06-26 LAB — PTH, INTACT AND CALCIUM
CALCIUM: 8.9 mg/dL (ref 8.6–10.2)
PTH: 71 pg/mL — ABNORMAL HIGH (ref 15–65)

## 2017-06-26 LAB — GAMMA GT: GGT: 40 IU/L (ref 0–65)

## 2017-06-26 LAB — HEPATITIS A ANTIBODY, TOTAL: Hep A Total Ab: NEGATIVE

## 2017-06-26 LAB — HEPATITIS B SURFACE ANTIBODY,QUALITATIVE: Hep B Surface Ab, Qual: NONREACTIVE

## 2017-06-26 LAB — HEPATITIS B E ANTIGEN: Hep B E Ag: NEGATIVE

## 2017-06-26 LAB — HEPATITIS B CORE ANTIBODY, TOTAL: HEP B C TOTAL AB: NEGATIVE

## 2017-06-26 LAB — HEPATITIS B E ANTIBODY: HEP B E AB: NEGATIVE

## 2017-06-30 ENCOUNTER — Ambulatory Visit
Admission: RE | Admit: 2017-06-30 | Discharge: 2017-06-30 | Disposition: A | Discharge status: Home / Self Care | Payer: Medicare HMO | Source: Ambulatory Visit | Attending: Gastroenterology | Admitting: Gastroenterology

## 2017-06-30 DIAGNOSIS — I77811 Abdominal aortic ectasia: Secondary | ICD-10-CM | POA: Diagnosis not present

## 2017-06-30 DIAGNOSIS — R7989 Other specified abnormal findings of blood chemistry: Secondary | ICD-10-CM | POA: Diagnosis not present

## 2017-06-30 DIAGNOSIS — R945 Abnormal results of liver function studies: Secondary | ICD-10-CM | POA: Insufficient documentation

## 2017-07-02 ENCOUNTER — Telehealth: Payer: Self-pay

## 2017-07-02 NOTE — Telephone Encounter (Signed)
Advised patient of results per Dr. Vicente Males.   Recommended someone stopping by for fatty liver diet or going online.

## 2017-07-02 NOTE — Telephone Encounter (Signed)
-----   Message from Jonathon Bellows, MD sent at 07/02/2017  5:30 AM EDT ----- USG shows hepatic steatosis

## 2017-07-15 ENCOUNTER — Telehealth: Payer: Self-pay

## 2017-07-15 ENCOUNTER — Other Ambulatory Visit: Payer: Self-pay

## 2017-07-15 DIAGNOSIS — R7989 Other specified abnormal findings of blood chemistry: Secondary | ICD-10-CM

## 2017-07-15 NOTE — Telephone Encounter (Signed)
Advised results per Dr. Vicente Males.   Spouse to callback with patient's response to request for MRCP.

## 2017-08-01 DIAGNOSIS — R69 Illness, unspecified: Secondary | ICD-10-CM | POA: Diagnosis not present

## 2017-08-05 DIAGNOSIS — E782 Mixed hyperlipidemia: Secondary | ICD-10-CM | POA: Diagnosis not present

## 2017-08-05 DIAGNOSIS — I480 Paroxysmal atrial fibrillation: Secondary | ICD-10-CM | POA: Diagnosis not present

## 2017-08-05 DIAGNOSIS — I4891 Unspecified atrial fibrillation: Secondary | ICD-10-CM | POA: Diagnosis not present

## 2017-08-05 DIAGNOSIS — N183 Chronic kidney disease, stage 3 (moderate): Secondary | ICD-10-CM | POA: Diagnosis not present

## 2017-08-11 ENCOUNTER — Other Ambulatory Visit: Payer: Self-pay | Admitting: Family Medicine

## 2017-08-12 NOTE — Telephone Encounter (Signed)
Dr. Vicente Males, Patient is taking 40 mg of atorvastatin daily. I reviewed his last set of labs, and see an MRCP is planned. May we continue the atorvastatin at this time at the current dose? Thank you, Enid Derry, MD

## 2017-08-12 NOTE — Telephone Encounter (Signed)
Yes his statin can be continued. Should be fine  Regards  Myrka Sylva

## 2017-08-21 ENCOUNTER — Encounter: Payer: Self-pay | Admitting: Family Medicine

## 2017-08-21 ENCOUNTER — Ambulatory Visit (INDEPENDENT_AMBULATORY_CARE_PROVIDER_SITE_OTHER): Payer: Medicare HMO | Admitting: Family Medicine

## 2017-08-21 VITALS — BP 118/62 | HR 74 | Temp 98.0°F | Resp 14 | Wt 200.8 lb

## 2017-08-21 DIAGNOSIS — N184 Chronic kidney disease, stage 4 (severe): Secondary | ICD-10-CM

## 2017-08-21 DIAGNOSIS — R748 Abnormal levels of other serum enzymes: Secondary | ICD-10-CM | POA: Diagnosis not present

## 2017-08-21 DIAGNOSIS — I480 Paroxysmal atrial fibrillation: Secondary | ICD-10-CM | POA: Diagnosis not present

## 2017-08-21 DIAGNOSIS — Z23 Encounter for immunization: Secondary | ICD-10-CM

## 2017-08-21 DIAGNOSIS — D631 Anemia in chronic kidney disease: Secondary | ICD-10-CM

## 2017-08-21 DIAGNOSIS — Z79899 Other long term (current) drug therapy: Secondary | ICD-10-CM

## 2017-08-21 DIAGNOSIS — R69 Illness, unspecified: Secondary | ICD-10-CM | POA: Diagnosis not present

## 2017-08-21 DIAGNOSIS — F0631 Mood disorder due to known physiological condition with depressive features: Secondary | ICD-10-CM

## 2017-08-21 DIAGNOSIS — I69398 Other sequelae of cerebral infarction: Secondary | ICD-10-CM

## 2017-08-21 NOTE — Patient Instructions (Addendum)
Start taking vitamin B12 250 or 500 mcg sublingual every day to help with energy Try to drink more water Return next week for a flu shot Please do arrange with Dr. Vicente Males to have the study done when he's ready in the next month or two

## 2017-08-21 NOTE — Progress Notes (Signed)
BP 118/62   Pulse 74   Temp 98 F (36.7 C) (Oral)   Resp 14   Wt 200 lb 12.8 oz (91.1 kg)   SpO2 95%   BMI 23.81 kg/m    Subjective:    Patient ID: Duane [name], male    DOB: [birth year], 66 y.o.   MRN: [id number]  HPI: Duane [name] is a 66 y.o. male  Chief Complaint  Patient presents with  . Follow-up    HPI Patient is here for f/u Since last visit, he had US of the liver and it showed fatty liver; they wanted him to see a thyroid / endo person; they wanted to do another imaging test; MRCP; wife would like to wait until January We reviewed his [place] results; fatty liver; patient has never been jaundice; no pain and appetite is good  He is on ritalin; helps with alertness during the day; wife believes it helps; he sits for a while and then gets up and goes out and does stuff in the garage  He had colon resection; wife concerned about vitamin absorption; stools are back to normal; not back on iron yet; no constipation; no blood in the stools  Seeing kidney doctor for CKD; every 4 months; on vitamin D per that specialist; avoiding NSAIDs; will try to be a better water drinker  Just saw heart doctor a few weeks ago; a-fib; on anticoagulant  Depression screen [facility] 2/9 08/21/2017 05/23/2017 02/19/2017 12/26/2016 11/21/2016  Decreased Interest 0 0 0 0 0  Down, Depressed, Hopeless 1 0 0 0 0  PHQ - 2 Score 1 0 0 0 0    Relevant past medical, surgical, family and social history reviewed Past Medical History:  Diagnosis Date  . A-fib [facility])    requiring life-long anticouagulation  . Cerebellar infarct ([place])   . CKD (chronic kidney disease)    creatinine baseline 1.3-1.5 in 2015  . GERD (gastroesophageal reflux disease)   . Gout   . Graves disease   . H/O hyperkalemia    with Bactrim  . Hyperlipidemia    requiring life long statin therapy  . Hypertension   . Hypothyroidism   . Left hemiparesis [facility]) May 2015   s/p strke  . Poor short term memory   . Renal  insufficiency   . Restrictive lung disease    mild  . Stroke ([place]) 02/20/14   left side hemiparesis, dysphagia  . UTI (urinary tract infection) due to Enterococcus May 2015  . Vitamin D deficiency disease    Past Surgical History:  Procedure Laterality Date  . ENTEROSTOMY CLOSURE  11/27/15   ileostomy takedown  . EXPLORATORY LAPAROTOMY  11/27/15  . illeostomy  07/2014  . POLYPECTOMY  10/15   GI surgery to remove polyp   Family History  Problem Relation Age of Onset  . Arthritis Mother   . Heart attack Father   . Heart disease Father   . Cirrhosis Sister    Social History   Socioeconomic History  . Marital status: Married    Spouse name: Not on file  . Number of children: Not on file  . Years of education: Not on file  . Highest education level: Not on file  Social Needs  . Financial resource strain: Not on file  . Food insecurity - worry: Not on file  . Food insecurity - inability: Not on file  . Transportation needs - medical: Not on file  . Transportation needs - non-medical: Not on  file  Occupational History  . Not on file  Tobacco Use  . Smoking status: Never Smoker  . Smokeless tobacco: Never Used  Substance and Sexual Activity  . Alcohol use: No  . Drug use: No  . Sexual activity: Not Currently  Other Topics Concern  . Not on file  Social History Narrative  . Not on file    Interim medical history since last visit reviewed. Allergies and medications reviewed  Review of Systems Per HPI unless specifically indicated above     Objective:    BP 118/62   Pulse 74   Temp 98 F (36.7 C) (Oral)   Resp 14   Wt 200 lb 12.8 oz (91.1 kg)   SpO2 95%   BMI 23.81 kg/m   Wt Readings from Last 3 Encounters:  08/21/17 200 lb 12.8 oz (91.1 kg)  06/23/17 204 lb 9.6 oz (92.8 kg)  05/23/17 208 lb 4.8 oz (94.5 kg)    Physical Exam  Constitutional: He appears well-developed and well-nourished. No distress.  HENT:  Mouth/Throat: Mucous membranes are normal.    Cardiovascular: Normal rate, regular rhythm and normal heart sounds.  Pulmonary/Chest: Effort normal and breath sounds normal. No respiratory distress. He has no decreased breath sounds.  Abdominal: He exhibits no distension.  Neurological: He is alert.  Psychiatric: His affect is blunt. He is not agitated, not aggressive and not combative.  Fair eye contact with examiner; rather blunted affect; not irritated as at previous visits; smiled a few times    Results for orders placed or performed in visit on 06/23/17  PTH, Intact and Calcium  Result Value Ref Range   Calcium 8.9 8.6 - 10.2 mg/dL   PTH 71 (H) 15 - 65 pg/mL   PTH Interp Comment   Hepatitis B Core Antibody, total  Result Value Ref Range   Hep B Core Total Ab Negative Negative  Hepatitis A Ab, Total  Result Value Ref Range   Hep A Total Ab Negative Negative  Hepatitis B Surface AntiBODY  Result Value Ref Range   Hep B Surface Ab, Qual Non Reactive   Hepatitis B Surface AntiGEN  Result Value Ref Range   Hepatitis B Surface Ag Negative Negative  Hepatitis C Antibody  Result Value Ref Range   Hep C Virus Ab <0.1 0.0 - 0.9 s/co ratio  Hepatitis B E Antigen  Result Value Ref Range   Hep B E Ag Negative Negative  Hepatitis B E Antibody  Result Value Ref Range   Hep B E Ab Negative Negative  HIV antibody (with reflex)  Result Value Ref Range   HIV Screen 4th Generation wRfx Non Reactive Non Reactive  Ceruloplasmin  Result Value Ref Range   Ceruloplasmin 24.0 16.0 - 31.0 mg/dL  Alpha-1-Antitrypsin  Result Value Ref Range   A-1 Antitrypsin 149 90 - 200 mg/dL  Celiac panel 10  Result Value Ref Range   Antigliadin Abs, IgA 6 0 - 19 units   Gliadin IgG 3 0 - 19 units   Transglutaminase IgA <2 0 - 3 U/mL   Tissue Transglut Ab <2 0 - 5 U/mL   Endomysial IgA Negative Negative   IgA/Immunoglobulin A, Serum 244 61 - 437 mg/dL  Immunoglobulins, QN, A/E/G/M  Result Value Ref Range   IgG (Immunoglobin G), Serum 1,096 700 -  1,600 mg/dL   IgM (Immunoglobulin M), Srm 38 20 - 172 mg/dL   IgE (Immunoglobulin E), Serum 10 0 - 100 IU/mL  AntiMicrosomal Ab-Liver /  Kidney  Result Value Ref Range   LKM1 Ab 2.6 0.0 - 20.0 Units  Gamma GT  Result Value Ref Range   GGT 40 0 - 65 IU/L  Vitamin D 1,25 dihydroxy  Result Value Ref Range   Vitamin D 1, 25 (OH)2 Total 29 pg/mL   Vitamin D2 1, 25 (OH)2 <10 pg/mL   Vitamin D3 1, 25 (OH)2 29 pg/mL      Assessment & Plan:   Problem List Items Addressed This Visit      Cardiovascular and Mediastinum   A-fib (HCC)    Intermittent, he sounds to be in NSR today; on anticoagulation; just saw cardiologist a few weeks ago        Genitourinary   Chronic kidney disease, stage IV (severe) (HCC) (Chronic)    Seeing nephrologist; I suspect the elevated PTH is related to his kidney disease; sending a copy of labs to kidney specialist      Anemia, chronic renal failure    Patient will f/u with kidney doctor; iron three days a week        Other   On stimulant medication    Patient's wife has two prescriptions left over from last visit, not filled yet; no new Rxs written today, but I am fine to provide a refill before next visit in 3 months if needed; no red flags, no concerns for misuse or diversion      Depression due to old stroke (Chronic)    Stable; continue ritalin during the day which is beneficial per patient and wife      Alkaline phosphatase elevation    Being worked up by gastroenterologist; wife wishes to wait on the MRCP for another month or so, so much going on       Other Visit Diagnoses    Needs flu shot    -  Primary       Follow up plan: Return in about 3 months (around 11/21/2017) for follow-up visit with Dr. Sanda Klein.  An after-visit summary was printed and given to the patient at [facility].  Please see the patient instructions which may contain other information and recommendations beyond what is mentioned above in the assessment and plan.  No  orders of the defined types were placed in this encounter.   No orders of the defined types were placed in this encounter.

## 2017-08-21 NOTE — Assessment & Plan Note (Signed)
Patient's wife has two prescriptions left over from last visit, not filled yet; no new Rxs written today, but I am fine to provide a refill before next visit in 3 months if needed; no red flags, no concerns for misuse or diversion

## 2017-08-21 NOTE — Assessment & Plan Note (Signed)
Intermittent, he sounds to be in NSR today; on anticoagulation; just saw cardiologist a few weeks ago

## 2017-08-21 NOTE — Assessment & Plan Note (Signed)
Seeing nephrologist; I suspect the elevated PTH is related to his kidney disease; sending a copy of labs to kidney specialist

## 2017-08-21 NOTE — Assessment & Plan Note (Signed)
Patient will f/u with kidney doctor; iron three days a week

## 2017-08-21 NOTE — Assessment & Plan Note (Signed)
Being worked up by gastroenterologist; wife wishes to wait on the MRCP for another month or so, so much going on

## 2017-08-21 NOTE — Assessment & Plan Note (Signed)
Stable; continue ritalin during the day which is beneficial per patient and wife

## 2017-08-22 ENCOUNTER — Ambulatory Visit: Payer: Medicare HMO | Admitting: Family Medicine

## 2017-09-09 ENCOUNTER — Ambulatory Visit (INDEPENDENT_AMBULATORY_CARE_PROVIDER_SITE_OTHER): Payer: Medicare HMO

## 2017-09-09 DIAGNOSIS — Z23 Encounter for immunization: Secondary | ICD-10-CM

## 2017-10-02 ENCOUNTER — Encounter: Payer: Self-pay | Admitting: Family Medicine

## 2017-10-02 DIAGNOSIS — M109 Gout, unspecified: Secondary | ICD-10-CM | POA: Diagnosis not present

## 2017-10-02 DIAGNOSIS — N2581 Secondary hyperparathyroidism of renal origin: Secondary | ICD-10-CM | POA: Insufficient documentation

## 2017-10-02 DIAGNOSIS — N184 Chronic kidney disease, stage 4 (severe): Secondary | ICD-10-CM | POA: Diagnosis not present

## 2017-10-02 DIAGNOSIS — D631 Anemia in chronic kidney disease: Secondary | ICD-10-CM | POA: Diagnosis not present

## 2017-10-02 HISTORY — DX: Secondary hyperparathyroidism of renal origin: N25.81

## 2017-10-08 ENCOUNTER — Other Ambulatory Visit: Payer: Self-pay | Admitting: Family Medicine

## 2017-10-08 ENCOUNTER — Other Ambulatory Visit: Payer: Self-pay | Admitting: Gastroenterology

## 2017-10-08 DIAGNOSIS — R7989 Other specified abnormal findings of blood chemistry: Secondary | ICD-10-CM

## 2017-10-08 DIAGNOSIS — R945 Abnormal results of liver function studies: Secondary | ICD-10-CM

## 2017-10-08 MED ORDER — DOXAZOSIN MESYLATE 1 MG PO TABS: 1.0000 mg | ORAL_TABLET | Freq: Every day | ORAL | 2 refills | Status: DC

## 2017-10-08 NOTE — Progress Notes (Signed)
Rx request received for 2 mg doxazosin He now takes 1 mg New Rx sent 2 mg request denied

## 2017-10-14 ENCOUNTER — Other Ambulatory Visit: Payer: Self-pay | Admitting: Family Medicine

## 2017-10-15 NOTE — Telephone Encounter (Signed)
Rx request for statin; however, another doctor just refilled it earlier today

## 2017-10-21 ENCOUNTER — Telehealth: Payer: Self-pay | Admitting: Family Medicine

## 2017-10-21 MED ORDER — METHYLPHENIDATE HCL 5 MG PO TABS: 5.0000 mg | ORAL_TABLET | Freq: Two times a day (BID) | ORAL | 0 refills | Status: DC

## 2017-10-21 NOTE — Telephone Encounter (Signed)
Copied from [place] [id number]. Topic: General - Other >> Oct 21, 2017  4:37 PM Neva Seat wrote:  Pt is needing the written Rx Methylthenidate 5mg  to pick up at office.  Pt has about 1 week worth left.

## 2017-10-21 NOTE — Telephone Encounter (Signed)
[place] web site reviewed; last fill was Nov Okay for Rx

## 2017-10-22 ENCOUNTER — Other Ambulatory Visit: Payer: Self-pay | Admitting: Family Medicine

## 2017-10-24 DIAGNOSIS — R69 Illness, unspecified: Secondary | ICD-10-CM | POA: Diagnosis not present

## 2017-10-30 NOTE — Telephone Encounter (Signed)
Up front waiting

## 2017-10-30 NOTE — Telephone Encounter (Signed)
Wife is going to pick up Rx today. Rx states printed 10/21/17 for pick up.

## 2017-11-04 ENCOUNTER — Encounter: Payer: Self-pay | Admitting: Endocrinology

## 2017-11-04 ENCOUNTER — Ambulatory Visit: Payer: Medicare HMO | Admitting: Endocrinology

## 2017-11-04 VITALS — BP 120/58 | HR 70 | Wt 207.2 lb

## 2017-11-04 DIAGNOSIS — N2581 Secondary hyperparathyroidism of renal origin: Secondary | ICD-10-CM | POA: Diagnosis not present

## 2017-11-04 NOTE — Patient Instructions (Signed)
Please sign a release of information form today. Please continue to have Dr Holley Raring take care of the vitamin-D. I would be happy to see you back here as needed

## 2017-11-04 NOTE — Progress Notes (Signed)
Subjective:    Patient ID: Duane [name], male    DOB: 1951-07-11, 67 y.o.   MRN: [id number]  HPI Pt is referred by Dr Sanda Klein, for hyperparathyroidism.  Pt was noted to have hypocalcemia in 2018.  he denies h/o the following: seizures, pancreatitis, cancer, osteoporosis, eating disorder, bony fx, neck surgery, chelation rx.  He has had partial colectomy.  Dr Holley Raring started rocaltrol in mid-2018, and also has rx'ed high-dose ergocalciferol (he now takes OTC, 2000 units/d).  He has slight easy bruising throughout the body, and assoc fatigue.   Past Medical History:  Diagnosis Date  . A-fib [facility]    requiring life-long anticouagulation  . Cerebellar infarct ([place])   . CKD (chronic kidney disease)    creatinine baseline 1.3-1.5 in 2015  . GERD (gastroesophageal reflux disease)   . Gout   . Graves disease   . H/O hyperkalemia    with Bactrim  . Hyperlipidemia    requiring life long statin therapy  . Hyperparathyroidism, secondary renal ([place]) 10/02/2017  . Hypertension   . Hypothyroidism   . Left hemiparesis [facility]) May 2015   s/p strke  . Poor short term memory   . Renal insufficiency   . Restrictive lung disease    mild  . Stroke ([place]) 02/20/14   left side hemiparesis, dysphagia  . UTI (urinary tract infection) due to Enterococcus May 2015  . Vitamin D deficiency disease     Past Surgical History:  Procedure Laterality Date  . CATARACT EXTRACTION W/PHACO Right 08/27/2016   Procedure: CATARACT EXTRACTION PHACO AND INTRAOCULAR LENS PLACEMENT (IOC);  Surgeon: Eulogio Bear, MD;  Location: [facility];  Service: Ophthalmology;  Laterality: Right;  RIGHT  . ENTEROSTOMY CLOSURE  11/27/15   ileostomy takedown  . EXPLORATORY LAPAROTOMY  11/27/15  . illeostomy  07/2014  . POLYPECTOMY  10/15   GI surgery to remove polyp    Social History   Socioeconomic History  . Marital status: Married    Spouse name: Not on file  . Number of children: Not on file  . Years of  education: Not on file  . Highest education level: Not on file  Social Needs  . Financial resource strain: Not on file  . Food insecurity - worry: Not on file  . Food insecurity - inability: Not on file  . Transportation needs - medical: Not on file  . Transportation needs - non-medical: Not on file  Occupational History  . Not on file  Tobacco Use  . Smoking status: Never Smoker  . Smokeless tobacco: Never Used  Substance and Sexual Activity  . Alcohol use: No  . Drug use: No  . Sexual activity: Not Currently  Other Topics Concern  . Not on file  Social History Narrative  . Not on file    Current Outpatient Medications on File Prior to Visit  Medication Sig Dispense Refill  . allopurinol (ZYLOPRIM) 300 MG tablet Take 300 mg by mouth daily.    Marland Kitchen amiodarone (PACERONE) 200 MG tablet Take 200 mg by mouth daily.    Marland Kitchen apixaban (ELIQUIS) 5 MG TABS tablet Take 1 tablet (5 mg total) by mouth 2 (two) times daily. 180 tablet 3  . atorvastatin (LIPITOR) 40 MG tablet Take 1 tablet (40 mg total) by mouth every morning. 90 tablet 0  . calcitRIOL (ROCALTROL) 0.25 MCG capsule Take 0.25 mcg daily by mouth.     . Cholecalciferol (VITAMIN D3) 2000 units capsule Take 2,000 Units by mouth  daily.    . cyanocobalamin 500 MCG tablet Take 500 mcg by mouth daily.    Marland Kitchen doxazosin (CARDURA) 1 MG tablet Take 1 tablet (1 mg total) by mouth daily. 30 tablet 2  . famotidine (PEPCID) 20 MG tablet TAKE 1 TABLET BY MOUTH TWICE DAILY 180 tablet 3  . ferrous sulfate 325 (65 FE) MG tablet Take 325 mg by mouth 3 (three) times daily with meals.     Marland Kitchen FLUoxetine (PROZAC) 40 MG capsule TAKE ONE CAPSULE BY MOUTH ONCE DAILY 90 capsule 3  . levothyroxine (SYNTHROID, LEVOTHROID) 88 MCG tablet Take 1 tablet (88 mcg total) by mouth daily. 90 tablet 3  . methylphenidate (RITALIN) 5 MG tablet Take 1 tablet (5 mg total) by mouth 2 (two) times daily with breakfast and lunch. If needed for attention or focus; fill on or after Sept  9, 2018 60 tablet 0  . mirtazapine (REMERON) 15 MG tablet TAKE 1 TABLET BY MOUTH AT BEDTIME 90 tablet 1  . diazepam (VALIUM) 5 MG tablet      No current facility-administered medications on file prior to visit.     Allergies  Allergen Reactions  . Bactrim [Sulfamethoxazole-Trimethoprim] Other (See Comments)    Hyperkalemia  . Chlorpromazine Other (See Comments)    Hiccups, hallucinations  . Cyclobenzaprine Other (See Comments)    Sleeps for 8 hrs after taking it  . Oxycodone Other (See Comments)    Excessive sleepiness    Family History  Problem Relation Age of Onset  . Arthritis Mother   . Heart attack Father   . Heart disease Father   . Cirrhosis Sister   . Hyperparathyroidism Neg Hx     BP (!) 120/58 (BP Location: Left Arm, Patient Position: Sitting, Cuff Size: Normal)   Pulse 70   Wt 207 lb 3.2 oz (94 kg)   SpO2 97%   BMI 24.57 kg/m    Review of Systems denies cramps, muscle weakness, n/v, syncope, palpitations, rash, diarrhea, sob, weight loss, numbness, fever, urinary frequency, rhinorrhea, BRBPR, and blurry vision.  He has cold intolerance.     Objective:   Physical Exam VS: see vs page GEN: no distress HEAD: head: no deformity eyes: no periorbital swelling, no proptosis external nose and ears are normal mouth: no lesion seen NECK: supple, thyroid is not enlarged CHEST WALL: no deformity LUNGS: clear to auscultation CV: reg rate and rhythm, no murmur ABD: abdomen is soft, nontender.  no hepatosplenomegaly.  not distended.  no hernia.  Old healed surgical scar MUSCULOSKELETAL: muscle bulk and strength are grossly normal.  no obvious joint swelling.  gait is normal and steady EXTEMITIES: no deformity.  no edema PULSES: no carotid bruit NEURO:  cn 2-12 grossly intact.   readily moves all 4's.  sensation is intact to touch on all 4's SKIN:  Normal texture and temperature.  No rash or suspicious lesion is visible.   NODES:  None palpable at the neck PSYCH:  alert, well-oriented.  Does not appear anxious nor depressed.   Lab Results  Component Value Date   ALT 42 05/23/2017   AST 42 (H) 05/23/2017   GGT 40 06/23/2017   ALKPHOS 157 (H) 05/23/2017   BILITOT 0.7 05/23/2017   AP isoenzymes are normal  Lab Results  Component Value Date   CREATININE 2.58 (H) 05/23/2017   BUN 29 (H) 05/23/2017   NA 140 05/23/2017   K 4.9 05/23/2017   CL 106 05/23/2017   CO2 24 05/23/2017   We  have requested records from Dr Holley Raring    Assessment & Plan:  Hyperparathyroidism, new to me, prob due to renal insuff. Vit-D deficiency: under the care of nephrol for this.  elev AP, not related to the above  Patient Instructions  Please sign a release of information form today. Please continue to have Dr Holley Raring take care of the vitamin-D. I would be happy to see you back here as needed

## 2017-11-07 ENCOUNTER — Other Ambulatory Visit: Payer: Self-pay | Admitting: Family Medicine

## 2017-11-09 DIAGNOSIS — R69 Illness, unspecified: Secondary | ICD-10-CM | POA: Diagnosis not present

## 2017-11-21 ENCOUNTER — Ambulatory Visit (INDEPENDENT_AMBULATORY_CARE_PROVIDER_SITE_OTHER): Payer: Medicare HMO | Admitting: Family Medicine

## 2017-11-21 ENCOUNTER — Encounter: Payer: Self-pay | Admitting: Family Medicine

## 2017-11-21 DIAGNOSIS — I480 Paroxysmal atrial fibrillation: Secondary | ICD-10-CM

## 2017-11-21 DIAGNOSIS — E890 Postprocedural hypothyroidism: Secondary | ICD-10-CM

## 2017-11-21 DIAGNOSIS — F0631 Mood disorder due to known physiological condition with depressive features: Secondary | ICD-10-CM

## 2017-11-21 DIAGNOSIS — M1A30X0 Chronic gout due to renal impairment, unspecified site, without tophus (tophi): Secondary | ICD-10-CM

## 2017-11-21 DIAGNOSIS — D631 Anemia in chronic kidney disease: Secondary | ICD-10-CM | POA: Diagnosis not present

## 2017-11-21 DIAGNOSIS — E559 Vitamin D deficiency, unspecified: Secondary | ICD-10-CM | POA: Diagnosis not present

## 2017-11-21 DIAGNOSIS — R748 Abnormal levels of other serum enzymes: Secondary | ICD-10-CM | POA: Diagnosis not present

## 2017-11-21 DIAGNOSIS — N184 Chronic kidney disease, stage 4 (severe): Secondary | ICD-10-CM | POA: Diagnosis not present

## 2017-11-21 DIAGNOSIS — I10 Essential (primary) hypertension: Secondary | ICD-10-CM | POA: Diagnosis not present

## 2017-11-21 DIAGNOSIS — E782 Mixed hyperlipidemia: Secondary | ICD-10-CM

## 2017-11-21 DIAGNOSIS — N2581 Secondary hyperparathyroidism of renal origin: Secondary | ICD-10-CM | POA: Diagnosis not present

## 2017-11-21 DIAGNOSIS — I69398 Other sequelae of cerebral infarction: Secondary | ICD-10-CM | POA: Diagnosis not present

## 2017-11-21 LAB — LIPID PANEL
CHOLESTEROL: 156 mg/dL (ref <200)
HDL: 49 mg/dL (ref >40)
LDL Cholesterol (Calc): 88 mg/dL (calc)
NON-HDL CHOLESTEROL (CALC): 107 mg/dL (ref <130)
TRIGLYCERIDES: 91 mg/dL (ref <150)
Total CHOL/HDL Ratio: 3.2 (calc) (ref <5.0)

## 2017-11-21 LAB — TSH: TSH: 0.7 mIU/L (ref 0.40–4.50)

## 2017-11-21 NOTE — Assessment & Plan Note (Signed)
Check TSH today

## 2017-11-21 NOTE — Progress Notes (Signed)
BP 118/64 (BP Location: Left Arm, Patient Position: Sitting, Cuff Size: Large)   Pulse 71   Temp 97.9 F (36.6 C) (Oral)   Ht 6\' 4"  (1.93 m)   Wt 205 lb 8 oz (93.2 kg)   SpO2 98%   BMI 25.01 kg/m    Subjective:    Patient ID: Duane [name], male    DOB: [birth year], 67 y.o.   MRN: [id number]  HPI: Duane [name] is a 67 y.o. male  Chief Complaint  Patient presents with  . Follow-up    HPI Patient is here for follow-up No sickness or medical excitement since last visit  He has hyperparathyroidism; referred by GI to endocrinologist for elevated iPTH; endo thinks it is all kidneys; on calcitriol and vit D; just PRN visit in the future to endo says wife Flu shot in November; wife was sick, but patient has been well throughout the winter Blood pressure excellent; not checking really away from here Sees kidney doctor, bp was up at last visit, checked for a few days and came down okay He is watching his salt intake On the eliquis; no bleeding Hypothyroidism; dose was lowered and just didn't labs; no real weight gain; no new fatigue; stools are more constipated Lab Results  Component Value Date   TSH 0.19 (L) 05/23/2017  intermittent a-fib; not conscious of going in and out; Dr. Ubaldo Glassing is his cardiologist, on blood thinner All of the GI labs were normal/negative, reviewed with patient On higher dose of statin; no muscle aches; watches his diet halfway; hamburger a week; not much fast food; no eggs; not much cheese No more gout bouts Anemia of renal failure; CBC drawn in Dec by kidney doctor Depression and low motivation after stroke; helped by the medicines; wife wishes to continue, patient too  Depression screen [facility] 2/9 11/21/2017 08/21/2017 05/23/2017 02/19/2017 12/26/2016  Decreased Interest 0 0 0 0 0  Down, Depressed, Hopeless 0 1 0 0 0  PHQ - 2 Score 0 1 0 0 0   Relevant past medical, surgical, family and social history reviewed Past Medical History:  Diagnosis  Date  . A-fib [facility])    requiring life-long anticouagulation  . Cerebellar infarct ([place])   . CKD (chronic kidney disease)    creatinine baseline 1.3-1.5 in 2015  . GERD (gastroesophageal reflux disease)   . Gout   . Graves disease   . H/O hyperkalemia    with Bactrim  . Hyperlipidemia    requiring life long statin therapy  . Hyperparathyroidism, secondary renal ([place]) 10/02/2017  . Hypertension   . Hypothyroidism   . Left hemiparesis [facility]) May 2015   s/p strke  . Poor short term memory   . Renal insufficiency   . Restrictive lung disease    mild  . Stroke ([place]) 02/20/14   left side hemiparesis, dysphagia  . UTI (urinary tract infection) due to Enterococcus May 2015  . Vitamin D deficiency disease    Past Surgical History:  Procedure Laterality Date  . CATARACT EXTRACTION W/PHACO Right 08/27/2016   Procedure: CATARACT EXTRACTION PHACO AND INTRAOCULAR LENS PLACEMENT (IOC);  Surgeon: Eulogio Bear, MD;  Location: [place];  Service: Ophthalmology;  Laterality: Right;  RIGHT  . ENTEROSTOMY CLOSURE  11/27/15   ileostomy takedown  . EXPLORATORY LAPAROTOMY  11/27/15  . illeostomy  07/2014  . POLYPECTOMY  10/15   GI surgery to remove polyp   Family History  Problem Relation Age of Onset  . Arthritis  Mother   . Heart attack Father   . Heart disease Father   . Cirrhosis Sister   . Hyperparathyroidism Neg Hx    Social History   Tobacco Use  . Smoking status: Never Smoker  . Smokeless tobacco: Never Used  Substance Use Topics  . Alcohol use: No  . Drug use: No    Interim medical history since last visit reviewed. Allergies and medications reviewed  Review of Systems Per HPI unless specifically indicated above     Objective:    BP 118/64 (BP Location: Left Arm, Patient Position: Sitting, Cuff Size: Large)   Pulse 71   Temp 97.9 F (36.6 C) (Oral)   Ht 6\' 4"  (1.93 m)   Wt 205 lb 8 oz (93.2 kg)   SpO2 98%   BMI 25.01 kg/m   Wt Readings from Last 3  Encounters:  11/21/17 205 lb 8 oz (93.2 kg)  11/04/17 207 lb 3.2 oz (94 kg)  08/21/17 200 lb 12.8 oz (91.1 kg)    Physical Exam  Constitutional: He appears well-developed and well-nourished. No distress.  HENT:  Mouth/Throat: Mucous membranes are normal.  Neck: Carotid bruit is not present. No thyromegaly present.  Cardiovascular: Normal rate, regular rhythm and normal heart sounds.  Pulmonary/Chest: Effort normal and breath sounds normal. No respiratory distress. He has no decreased breath sounds.  Abdominal: He exhibits no distension.  Neurological: He is alert. He displays no tremor.  Sat very still throughout visit, little spontaneous movement; ambulatory without assistance  Psychiatric: His mood appears not anxious. His affect is blunt. His affect is not angry. His speech is delayed. He is slowed. He is not agitated, not aggressive and not combative. He does not exhibit a depressed mood.  Fair eye contact with examiner; rather blunted affect; not irritated; answers question, slight delay in response; poor eye contact    Results for orders placed or performed in visit on 06/23/17  PTH, Intact and Calcium  Result Value Ref Range   Calcium 8.9 8.6 - 10.2 mg/dL   PTH 71 (H) 15 - 65 pg/mL   PTH Interp Comment   Hepatitis B Core Antibody, total  Result Value Ref Range   Hep B Core Total Ab Negative Negative  Hepatitis A Ab, Total  Result Value Ref Range   Hep A Total Ab Negative Negative  Hepatitis B Surface AntiBODY  Result Value Ref Range   Hep B Surface Ab, Qual Non Reactive   Hepatitis B Surface AntiGEN  Result Value Ref Range   Hepatitis B Surface Ag Negative Negative  Hepatitis C Antibody  Result Value Ref Range   Hep C Virus Ab <0.1 0.0 - 0.9 s/co ratio  Hepatitis B E Antigen  Result Value Ref Range   Hep B E Ag Negative Negative  Hepatitis B E Antibody  Result Value Ref Range   Hep B E Ab Negative Negative  HIV antibody (with reflex)  Result Value Ref Range    HIV Screen 4th Generation wRfx Non Reactive Non Reactive  Ceruloplasmin  Result Value Ref Range   Ceruloplasmin 24.0 16.0 - 31.0 mg/dL  Alpha-1-Antitrypsin  Result Value Ref Range   A-1 Antitrypsin 149 90 - 200 mg/dL  Celiac panel 10  Result Value Ref Range   Antigliadin Abs, IgA 6 0 - 19 units   Gliadin IgG 3 0 - 19 units   Transglutaminase IgA <2 0 - 3 U/mL   Tissue Transglut Ab <2 0 - 5 U/mL   Endomysial  IgA Negative Negative   IgA/Immunoglobulin A, Serum 244 61 - 437 mg/dL  Immunoglobulins, QN, A/E/G/M  Result Value Ref Range   IgG (Immunoglobin G), Serum 1,096 700 - 1,600 mg/dL   IgM (Immunoglobulin M), Srm 38 20 - 172 mg/dL   IgE (Immunoglobulin E), Serum 10 0 - 100 IU/mL  AntiMicrosomal Ab-Liver / Kidney  Result Value Ref Range   LKM1 Ab 2.6 0.0 - 20.0 Units  Gamma GT  Result Value Ref Range   GGT 40 0 - 65 IU/L  Vitamin D 1,25 dihydroxy  Result Value Ref Range   Vitamin D 1, 25 (OH)2 Total 29 pg/mL   Vitamin D2 1, 25 (OH)2 <10 pg/mL   Vitamin D3 1, 25 (OH)2 29 pg/mL      Assessment & Plan:   Problem List Items Addressed This Visit      Cardiovascular and Mediastinum   Hypertension (Chronic)    Excellent control      A-fib (HCC)    Intermittent; today in NSR        Endocrine   Hypothyroidism following radioiodine therapy (Chronic)    Check TSH today      Relevant Orders   TSH   Hyperparathyroidism, secondary renal (HCC)    Monitor by nephrologist, evaluated by endo        Musculoskeletal and Integument   Chronic gout due to renal impairment    Watching diet; no recent flares        Genitourinary   Chronic kidney disease, stage IV (severe) (HCC) (Chronic)    Monitored by nephro; avoiding NSAIDs; try to drink more water      Anemia, chronic renal failure    Monitored by nephrologist        Other   Vitamin D deficiency    Monitored by kidney doctor      Hyperlipidemia    Limit saturated fats; check fasting lipids today       Relevant Orders   Lipid panel   Depression due to old stroke (Chronic)    Continue SSRI and stimulant      Alkaline phosphatase elevation    Monitored by GI         Okay for refills of ritalin before next appt if/when needed  Follow up plan: No Follow-up on file.  An after-visit summary was printed and given to the patient at [place].  Please see the patient instructions which may contain other information and recommendations beyond what is mentioned above in the assessment and plan.  No orders of the defined types were placed in this encounter.   Orders Placed This Encounter  Procedures  . Lipid panel  . TSH

## 2017-11-21 NOTE — Assessment & Plan Note (Signed)
Continue SSRI and stimulant

## 2017-11-21 NOTE — Patient Instructions (Addendum)
The next time you come in, we'll give you the other pneumonia shot Return in 3 months If you have not heard anything from my staff in a week about any orders/referrals/studies from today, please contact us here to follow-up [phone] Try to limit saturated fats in your diet (bologna, hot dogs, barbeque, cheeseburgers, hamburgers, steak, bacon, sausage, cheese, etc.) and get more fresh fruits, vegetables, and whole grains

## 2017-11-21 NOTE — Assessment & Plan Note (Signed)
Monitored by GI 

## 2017-11-21 NOTE — Assessment & Plan Note (Signed)
Excellent control.   

## 2017-11-21 NOTE — Assessment & Plan Note (Signed)
Limit saturated fats; check fasting lipids today

## 2017-11-21 NOTE — Assessment & Plan Note (Signed)
Monitor by nephrologist, evaluated by endo

## 2017-11-21 NOTE — Assessment & Plan Note (Signed)
Monitored by nephrologist 

## 2017-11-21 NOTE — Assessment & Plan Note (Signed)
Monitored by kidney doctor

## 2017-11-21 NOTE — Assessment & Plan Note (Signed)
Intermittent; today in NSR

## 2017-11-21 NOTE — Assessment & Plan Note (Signed)
Watching diet; no recent flares

## 2017-11-21 NOTE — Assessment & Plan Note (Signed)
Monitored by nephro; avoiding NSAIDs; try to drink more water

## 2017-11-24 ENCOUNTER — Other Ambulatory Visit: Payer: Self-pay

## 2017-11-24 ENCOUNTER — Other Ambulatory Visit: Payer: Self-pay | Admitting: Family Medicine

## 2017-11-24 ENCOUNTER — Telehealth: Payer: Self-pay | Admitting: Family Medicine

## 2017-11-24 DIAGNOSIS — Z5181 Encounter for therapeutic drug level monitoring: Secondary | ICD-10-CM

## 2017-11-24 DIAGNOSIS — E782 Mixed hyperlipidemia: Secondary | ICD-10-CM

## 2017-11-24 DIAGNOSIS — E890 Postprocedural hypothyroidism: Secondary | ICD-10-CM

## 2017-11-24 MED ORDER — ATORVASTATIN CALCIUM 40 MG PO TABS: 60.0000 mg | ORAL_TABLET | Freq: Every day | ORAL | 0 refills | Status: DC

## 2017-11-24 NOTE — Telephone Encounter (Signed)
Pt wife notified lab ordered

## 2017-11-24 NOTE — Progress Notes (Signed)
Hepatic  

## 2017-11-24 NOTE — Progress Notes (Signed)
Increase atorvastatin to 60 mg at bedtime

## 2017-11-24 NOTE — Telephone Encounter (Signed)
Pharmacy wants to switch manufacturer of the thyroid medicine That's fine, but we'll need to have patient recheck the TSH about 6-8 weeks after starting new pill Okay to run the TSH on the same day with the other labs entered for April (please order) Thank you

## 2017-12-10 DIAGNOSIS — R69 Illness, unspecified: Secondary | ICD-10-CM | POA: Diagnosis not present

## 2017-12-31 DIAGNOSIS — R69 Illness, unspecified: Secondary | ICD-10-CM | POA: Diagnosis not present

## 2018-01-09 ENCOUNTER — Other Ambulatory Visit: Payer: Self-pay | Admitting: Family Medicine

## 2018-01-09 ENCOUNTER — Telehealth: Payer: Self-pay | Admitting: Family Medicine

## 2018-01-09 DIAGNOSIS — R945 Abnormal results of liver function studies: Secondary | ICD-10-CM

## 2018-01-09 DIAGNOSIS — R7989 Other specified abnormal findings of blood chemistry: Secondary | ICD-10-CM

## 2018-01-09 MED ORDER — METHYLPHENIDATE HCL 5 MG PO TABS: 5.0000 mg | ORAL_TABLET | Freq: Two times a day (BID) | ORAL | 0 refills | Status: DC

## 2018-01-09 NOTE — Telephone Encounter (Signed)
Copied from [place] [id number]. Topic: Quick Communication - Rx Refill/Question >> Jan 09, 2018  1:15 PM Ether Griffins B wrote: Medication: methylphenidate (RITALIN) 5 MG tablet  Preferred Pharmacy (with phone number or street name): [facility] [place], [place]: Please be advised that RX refills may take up to 3 business days. We ask that you follow-up with your pharmacy.

## 2018-01-09 NOTE — Telephone Encounter (Signed)
Rx sent 

## 2018-02-02 DIAGNOSIS — E782 Mixed hyperlipidemia: Secondary | ICD-10-CM | POA: Diagnosis not present

## 2018-02-02 DIAGNOSIS — N183 Chronic kidney disease, stage 3 (moderate): Secondary | ICD-10-CM | POA: Diagnosis not present

## 2018-02-02 DIAGNOSIS — I480 Paroxysmal atrial fibrillation: Secondary | ICD-10-CM | POA: Diagnosis not present

## 2018-02-12 DIAGNOSIS — M109 Gout, unspecified: Secondary | ICD-10-CM | POA: Diagnosis not present

## 2018-02-12 DIAGNOSIS — N2581 Secondary hyperparathyroidism of renal origin: Secondary | ICD-10-CM | POA: Diagnosis not present

## 2018-02-12 DIAGNOSIS — D631 Anemia in chronic kidney disease: Secondary | ICD-10-CM | POA: Diagnosis not present

## 2018-02-12 DIAGNOSIS — N184 Chronic kidney disease, stage 4 (severe): Secondary | ICD-10-CM | POA: Diagnosis not present

## 2018-02-19 ENCOUNTER — Encounter: Payer: Self-pay | Admitting: Family Medicine

## 2018-02-19 ENCOUNTER — Ambulatory Visit (INDEPENDENT_AMBULATORY_CARE_PROVIDER_SITE_OTHER): Payer: Medicare HMO | Admitting: Family Medicine

## 2018-02-19 VITALS — BP 124/60 | HR 74 | Temp 98.2°F | Resp 14 | Ht 76.0 in | Wt 205.0 lb

## 2018-02-19 DIAGNOSIS — I69398 Other sequelae of cerebral infarction: Secondary | ICD-10-CM | POA: Diagnosis not present

## 2018-02-19 DIAGNOSIS — D631 Anemia in chronic kidney disease: Secondary | ICD-10-CM

## 2018-02-19 DIAGNOSIS — F0631 Mood disorder due to known physiological condition with depressive features: Secondary | ICD-10-CM | POA: Diagnosis not present

## 2018-02-19 DIAGNOSIS — Z8739 Personal history of other diseases of the musculoskeletal system and connective tissue: Secondary | ICD-10-CM

## 2018-02-19 DIAGNOSIS — N2581 Secondary hyperparathyroidism of renal origin: Secondary | ICD-10-CM | POA: Diagnosis not present

## 2018-02-19 DIAGNOSIS — Z5181 Encounter for therapeutic drug level monitoring: Secondary | ICD-10-CM

## 2018-02-19 DIAGNOSIS — E559 Vitamin D deficiency, unspecified: Secondary | ICD-10-CM

## 2018-02-19 DIAGNOSIS — M40204 Unspecified kyphosis, thoracic region: Secondary | ICD-10-CM | POA: Diagnosis not present

## 2018-02-19 DIAGNOSIS — N184 Chronic kidney disease, stage 4 (severe): Secondary | ICD-10-CM

## 2018-02-19 DIAGNOSIS — I480 Paroxysmal atrial fibrillation: Secondary | ICD-10-CM

## 2018-02-19 DIAGNOSIS — E782 Mixed hyperlipidemia: Secondary | ICD-10-CM | POA: Diagnosis not present

## 2018-02-19 DIAGNOSIS — D485 Neoplasm of uncertain behavior of skin: Secondary | ICD-10-CM

## 2018-02-19 DIAGNOSIS — E890 Postprocedural hypothyroidism: Secondary | ICD-10-CM

## 2018-02-19 LAB — LIPID PANEL
Cholesterol: 140 mg/dL (ref <200)
HDL: 40 mg/dL — ABNORMAL LOW (ref >40)
LDL CHOLESTEROL (CALC): 81 mg/dL
NON-HDL CHOLESTEROL (CALC): 100 mg/dL (ref <130)
TRIGLYCERIDES: 98 mg/dL (ref <150)
Total CHOL/HDL Ratio: 3.5 (calc) (ref <5.0)

## 2018-02-19 LAB — HEPATIC FUNCTION PANEL
AG Ratio: 1.3 (calc) (ref 1.0–2.5)
ALKALINE PHOSPHATASE (APISO): 125 U/L — AB (ref 40–115)
ALT: 59 U/L — ABNORMAL HIGH (ref 9–46)
AST: 61 U/L — ABNORMAL HIGH (ref 10–35)
Albumin: 3.2 g/dL — ABNORMAL LOW (ref 3.6–5.1)
BILIRUBIN INDIRECT: 0.5 mg/dL (ref 0.2–1.2)
Bilirubin, Direct: 0.2 mg/dL (ref 0.0–0.2)
GLOBULIN: 2.4 g/dL (ref 1.9–3.7)
Total Bilirubin: 0.7 mg/dL (ref 0.2–1.2)
Total Protein: 5.6 g/dL — ABNORMAL LOW (ref 6.1–8.1)

## 2018-02-19 LAB — TSH: TSH: 0.72 mIU/L (ref 0.40–4.50)

## 2018-02-19 MED ORDER — METHYLPHENIDATE HCL 5 MG PO TABS: 5.0000 mg | ORAL_TABLET | Freq: Two times a day (BID) | ORAL | 0 refills | Status: DC

## 2018-02-19 NOTE — Assessment & Plan Note (Signed)
Managed by kidney doctor 

## 2018-02-19 NOTE — Assessment & Plan Note (Signed)
Limit saturated fats; continue statin; check lipids today

## 2018-02-19 NOTE — Assessment & Plan Note (Signed)
Avoiding NSAIDs; increase water intake

## 2018-02-19 NOTE — Assessment & Plan Note (Signed)
Get DEXA scan given the kyphosis; seeing nephrologist (labs recently done)

## 2018-02-19 NOTE — Patient Instructions (Addendum)
Please do call to schedule your bone density study; the number to schedule one at either [facility] or [facility] is [phone] or [phone] We'll get labs today We'll have you see the dermatologist If you have not heard anything from my staff in a week about any orders/referrals/studies from today, please contact us here to follow-up [phone]

## 2018-02-19 NOTE — Assessment & Plan Note (Signed)
Continue ritalin, rx sent

## 2018-02-19 NOTE — Progress Notes (Signed)
BP 124/60   Pulse 74   Temp 98.2 F (36.8 C) (Oral)   Resp 14   Ht 6\' 4"  (1.93 m)   Wt 205 lb (93 kg)   SpO2 95%   BMI 24.95 kg/m    Subjective:    Patient ID: Duane [name], male    DOB: Jan 02, 1951, 67 y.o.   MRN: [id number]  HPI: [name] is a 67 y.o. male  Chief Complaint  Patient presents with  . Follow-up    HPI Here for f/u with wife No medical excitement  HTN; well-controlled; not much salt on his food; not sure about family hx Gout; no flare in years; avoiding high purine foods After stroke, on Eliquis; no bleeding from gums, nose, urine, stool High cholesterol; on statin; not much cheese; not much fatty meat, maybe a flat sausage patty once a week; not much milk, just with cereal; gets lactaid because he was sensitive to millk;  On ritalin after stroke; feels fine; denies shakiness, palpitations, chest pain Lesion on the upper forehead left side; several weeks; keeps bleeding, not healing  Depression screen [facility] 2/9 02/19/2018 11/21/2017 08/21/2017 05/23/2017 02/19/2017  Decreased Interest 0 0 0 0 0  Down, Depressed, Hopeless 0 0 1 0 0  PHQ - 2 Score 0 0 1 0 0    Relevant past medical, surgical, family and social history reviewed Past Medical History:  Diagnosis Date  . A-fib [facility])    requiring life-long anticouagulation  . Cerebellar infarct ([place])   . CKD (chronic kidney disease)    creatinine baseline 1.3-1.5 in 2015  . GERD (gastroesophageal reflux disease)   . Gout   . Graves disease   . H/O hyperkalemia    with Bactrim  . Hyperlipidemia    requiring life long statin therapy  . Hyperparathyroidism, secondary renal ([place]) 10/02/2017  . Hypertension   . Hypothyroidism   . Left hemiparesis [facility]) May 2015   s/p strke  . Poor short term memory   . Renal insufficiency   . Restrictive lung disease    mild  . Stroke ([place]) 02/20/14   left side hemiparesis, dysphagia  . UTI (urinary tract infection) due to Enterococcus May 2015  .  Vitamin D deficiency disease    Past Surgical History:  Procedure Laterality Date  . CATARACT EXTRACTION W/PHACO Right 08/27/2016   Procedure: CATARACT EXTRACTION PHACO AND INTRAOCULAR LENS PLACEMENT (IOC);  Surgeon: Eulogio Bear, MD;  Location: [place];  Service: Ophthalmology;  Laterality: Right;  RIGHT  . ENTEROSTOMY CLOSURE  11/27/15   ileostomy takedown  . EXPLORATORY LAPAROTOMY  11/27/15  . illeostomy  07/2014  . POLYPECTOMY  10/15   GI surgery to remove polyp   Family History  Problem Relation Age of Onset  . Arthritis Mother   . Heart attack Father   . Heart disease Father   . Cirrhosis Sister   . Hyperparathyroidism Neg Hx    Social History   Tobacco Use  . Smoking status: Never Smoker  . Smokeless tobacco: Never Used  Substance Use Topics  . Alcohol use: No  . Drug use: No    Interim medical history since last visit reviewed. Allergies and medications reviewed  Review of Systems Per HPI unless specifically indicated above     Objective:    BP 124/60   Pulse 74   Temp 98.2 F (36.8 C) (Oral)   Resp 14   Ht 6\' 4"  (1.93 m)   Wt 205  lb (93 kg)   SpO2 95%   BMI 24.95 kg/m   Wt Readings from Last 3 Encounters:  02/19/18 205 lb (93 kg)  11/21/17 205 lb 8 oz (93.2 kg)  11/04/17 207 lb 3.2 oz (94 kg)    Physical Exam  Constitutional: He appears well-developed and well-nourished. No distress.  HENT:  Head: Normocephalic and atraumatic.  Eyes: EOM are normal. No scleral icterus.  Neck: No thyromegaly present.  Cardiovascular: Normal rate and regular rhythm.  Pulmonary/Chest: Effort normal and breath sounds normal.  Abdominal: Soft. Bowel sounds are normal. He exhibits no distension.  Musculoskeletal: He exhibits no edema.       Thoracic back: He exhibits deformity (thoracic kyphosis, straightens some with posture change).  Neurological: Coordination normal.  Skin: Skin is warm and dry. Lesion (domed, crusted bloody lesion upper forehead  left side) noted. No pallor.     Psychiatric: He has a normal mood and affect. His behavior is normal. Judgment and thought content normal.    Results for orders placed or performed in visit on 11/21/17  Lipid panel  Result Value Ref Range   Cholesterol 156 <200 mg/dL   HDL 49 >40 mg/dL   Triglycerides 91 <150 mg/dL   LDL Cholesterol (Calc) 88 mg/dL (calc)   Total CHOL/HDL Ratio 3.2 <5.0 (calc)   Non-HDL Cholesterol (Calc) 107 <130 mg/dL (calc)  TSH  Result Value Ref Range   TSH 0.70 0.40 - 4.50 mIU/L      Assessment & Plan:   Problem List Items Addressed This Visit      Cardiovascular and Mediastinum   A-fib ([place])    Lifelong anticoagulation, rate controlled        Endocrine   Hypothyroidism following radioiodine therapy (Chronic)   Hyperparathyroidism, secondary renal ([place])    Get DEXA scan given the kyphosis; seeing nephrologist (labs recently done)      Relevant Orders   DG Bone Density     Genitourinary   Chronic kidney disease, stage IV (severe) (HCC) (Chronic)    Avoiding NSAIDs; increase water intake      Anemia, chronic renal failure    Managed by kidney doctor        Other   Vitamin D deficiency   Relevant Orders   DG Bone Density   Hyperlipidemia    Limit saturated fats; continue statin; check lipids today      History of gout    No recent flares      Depression due to old stroke (Chronic)    Continue ritalin, rx sent       Other Visit Diagnoses    Kyphosis of thoracic region, unspecified kyphosis type    -  Primary   Relevant Orders   DG Bone Density   Neoplasm of uncertain behavior of skin of face       Relevant Orders   Ambulatory referral to Dermatology   Medication monitoring encounter           Follow up plan: Return in about 3 months (around 05/22/2018) for follow-up visit with Dr. Sanda Klein.  An after-visit summary was printed and given to the patient at [place].  Please see the patient instructions which may contain other  information and recommendations beyond what is mentioned above in the assessment and plan.  Meds ordered this encounter  Medications  . methylphenidate (RITALIN) 5 MG tablet    Sig: Take 1 tablet (5 mg total) by mouth 2 (two) times daily with breakfast and lunch. If  needed for attention or focus    Dispense:  60 tablet    Refill:  0    Orders Placed This Encounter  Procedures  . DG Bone Density  . Ambulatory referral to Dermatology   Send labs to Dr. Ubaldo Glassing

## 2018-02-19 NOTE — Assessment & Plan Note (Signed)
No recent flares 

## 2018-02-19 NOTE — Assessment & Plan Note (Signed)
Lifelong anticoagulation, rate controlled

## 2018-02-25 ENCOUNTER — Other Ambulatory Visit: Payer: Self-pay | Admitting: Family Medicine

## 2018-02-25 MED ORDER — EZETIMIBE 10 MG PO TABS: 10.0000 mg | ORAL_TABLET | Freq: Every day | ORAL | 3 refills | Status: DC

## 2018-02-25 NOTE — Progress Notes (Signed)
Add zetia Staff to REFER to GI

## 2018-03-09 ENCOUNTER — Other Ambulatory Visit: Payer: Self-pay | Admitting: Family Medicine

## 2018-03-12 ENCOUNTER — Ambulatory Visit (INDEPENDENT_AMBULATORY_CARE_PROVIDER_SITE_OTHER): Payer: Medicare HMO

## 2018-03-12 VITALS — BP 112/62 | HR 62 | Temp 99.2°F | Ht 76.0 in | Wt 210.6 lb

## 2018-03-12 DIAGNOSIS — Z0000 Encounter for general adult medical examination without abnormal findings: Secondary | ICD-10-CM

## 2018-03-12 DIAGNOSIS — Z23 Encounter for immunization: Secondary | ICD-10-CM | POA: Diagnosis not present

## 2018-03-12 NOTE — Patient Instructions (Signed)
Duane [name] , Thank you for taking time to come for your Medicare Wellness Visit. I appreciate your ongoing commitment to your health goals. Please review the following plan we discussed and let me know if I can assist you in the future.   Screening recommendations/referrals: Colorectal Screening: Up to date Lung Cancer Screening: You do not qualify for this screening Hepatitis C Screening: Up to date  Vision and Dental Exams: Recommended annual ophthalmology exams for early detection of glaucoma and other disorders of the eye Recommended annual dental exams for proper oral hygiene  Vaccinations: Influenza vaccine: Up to date Pneumococcal vaccine: Up to date Tdap vaccine: Up to date Shingles vaccine: Please call your insurance company to determine your out of pocket expense for the Shingrix vaccine. You may also receive this vaccine at your local pharmacy or Health Dept.   Advanced directives: Advance directive discussed with you today. I have provided a copy for you to complete at home and have notarized. Once this is complete please bring a copy in to our office so we can scan it into your chart.  Conditions/risks identified: Recommend to drink at least 6-8 8oz glasses of water per day.  Next appointment: Please schedule your Annual Wellness Visit with your Nurse Health Advisor in one year.  Preventive Care 36 Years and Older, Male Preventive care refers to lifestyle choices and visits with your health care provider that can promote health and wellness. What does preventive care include?  A yearly physical exam. This is also called an annual well check.  Dental exams once or twice a year.  Routine eye exams. Ask your health care provider how often you should have your eyes checked.  Personal lifestyle choices, including:  Daily care of your teeth and gums.  Regular physical activity.  Eating a healthy diet.  Avoiding tobacco and drug use.  Limiting alcohol  use.  Practicing safe sex.  Taking low doses of aspirin every day.  Taking vitamin and mineral supplements as recommended by your health care provider. What happens during an annual well check? The services and screenings done by your health care provider during your annual well check will depend on your age, overall health, lifestyle risk factors, and family history of disease. Counseling  Your health care provider may ask you questions about your:  Alcohol use.  Tobacco use.  Drug use.  Emotional well-being.  Home and relationship well-being.  Sexual activity.  Eating habits.  History of falls.  Memory and ability to understand (cognition).  Work and work Statistician. Screening  You may have the following tests or measurements:  Height, weight, and BMI.  Blood pressure.  Lipid and cholesterol levels. These may be checked every 5 years, or more frequently if you are over 24 years old.  Skin check.  Lung cancer screening. You may have this screening every year starting at age 37 if you have a 30-pack-year history of smoking and currently smoke or have quit within the past 15 years.  Fecal occult blood test (FOBT) of the stool. You may have this test every year starting at age 50.  Flexible sigmoidoscopy or colonoscopy. You may have a sigmoidoscopy every 5 years or a colonoscopy every 10 years starting at age 76.  Prostate cancer screening. Recommendations will vary depending on your family history and other risks.  Hepatitis C blood test.  Hepatitis B blood test.  Sexually transmitted disease (STD) testing.  Diabetes screening. This is done by checking your blood sugar (glucose) after  you have not eaten for a while (fasting). You may have this done every 1-3 years.  Abdominal aortic aneurysm (AAA) screening. You may need this if you are a current or former smoker.  Osteoporosis. You may be screened starting at age 73 if you are at high risk. Talk with your  health care provider about your test results, treatment options, and if necessary, the need for more tests. Vaccines  Your health care provider may recommend certain vaccines, such as:  Influenza vaccine. This is recommended every year.  Tetanus, diphtheria, and acellular pertussis (Tdap, Td) vaccine. You may need a Td booster every 10 years.  Zoster vaccine. You may need this after age 28.  Pneumococcal 13-valent conjugate (PCV13) vaccine. One dose is recommended after age 74.  Pneumococcal polysaccharide (PPSV23) vaccine. One dose is recommended after age 49. Talk to your health care provider about which screenings and vaccines you need and how often you need them. This information is not intended to replace advice given to you by your health care provider. Make sure you discuss any questions you have with your health care provider. Document Released: 10/27/2015 Document Revised: 06/19/2016 Document Reviewed: 08/01/2015 Elsevier Interactive Patient Education  2017 [place] Prevention in the Home Falls can cause injuries. They can happen to people of all ages. There are many things you can do to make your home safe and to help prevent falls. What can I do on the outside of my home?  Regularly fix the edges of walkways and driveways and fix any cracks.  Remove anything that might make you trip as you walk through a door, such as a raised step or threshold.  Trim any bushes or trees on the path to your home.  Use bright outdoor lighting.  Clear any walking paths of anything that might make someone trip, such as rocks or tools.  Regularly check to see if handrails are loose or broken. Make sure that both sides of any steps have handrails.  Any raised decks and porches should have guardrails on the edges.  Have any leaves, snow, or ice cleared regularly.  Use sand or salt on walking paths during winter.  Clean up any spills in your garage right away. This includes oil  or grease spills. What can I do in the bathroom?  Use night lights.  Install grab bars by the toilet and in the tub and shower. Do not use towel bars as grab bars.  Use non-skid mats or decals in the tub or shower.  If you need to sit down in the shower, use a plastic, non-slip stool.  Keep the floor dry. Clean up any water that spills on the floor as soon as it happens.  Remove soap buildup in the tub or shower regularly.  Attach bath mats securely with double-sided non-slip rug tape.  Do not have throw rugs and other things on the floor that can make you trip. What can I do in the bedroom?  Use night lights.  Make sure that you have a light by your bed that is easy to reach.  Do not use any sheets or blankets that are too big for your bed. They should not hang down onto the floor.  Have a firm chair that has side arms. You can use this for support while you get dressed.  Do not have throw rugs and other things on the floor that can make you trip. What can I do in the kitchen?  Clean  up any spills right away.  Avoid walking on wet floors.  Keep items that you use a lot in easy-to-reach places.  If you need to reach something above you, use a strong step stool that has a grab bar.  Keep electrical cords out of the way.  Do not use floor polish or wax that makes floors slippery. If you must use wax, use non-skid floor wax.  Do not have throw rugs and other things on the floor that can make you trip. What can I do with my stairs?  Do not leave any items on the stairs.  Make sure that there are handrails on both sides of the stairs and use them. Fix handrails that are broken or loose. Make sure that handrails are as long as the stairways.  Check any carpeting to make sure that it is firmly attached to the stairs. Fix any carpet that is loose or worn.  Avoid having throw rugs at the top or bottom of the stairs. If you do have throw rugs, attach them to the floor with  carpet tape.  Make sure that you have a light switch at the top of the stairs and the bottom of the stairs. If you do not have them, ask someone to add them for you. What else can I do to help prevent falls?  Wear shoes that:  Do not have high heels.  Have rubber bottoms.  Are comfortable and fit you well.  Are closed at the toe. Do not wear sandals.  If you use a stepladder:  Make sure that it is fully opened. Do not climb a closed stepladder.  Make sure that both sides of the stepladder are locked into place.  Ask someone to hold it for you, if possible.  Clearly mark and make sure that you can see:  Any grab bars or handrails.  First and last steps.  Where the edge of each step is.  Use tools that help you move around (mobility aids) if they are needed. These include:  Canes.  Walkers.  Scooters.  Crutches.  Turn on the lights when you go into a dark area. Replace any light bulbs as soon as they burn out.  Set up your furniture so you have a clear path. Avoid moving your furniture around.  If any of your floors are uneven, fix them.  If there are any pets around you, be aware of where they are.  Review your medicines with your doctor. Some medicines can make you feel dizzy. This can increase your chance of falling. Ask your doctor what other things that you can do to help prevent falls. This information is not intended to replace advice given to you by your health care provider. Make sure you discuss any questions you have with your health care provider. Document Released: 07/27/2009 Document Revised: 03/07/2016 Document Reviewed: 11/04/2014 Elsevier Interactive Patient Education  2017 [employer].

## 2018-03-12 NOTE — Progress Notes (Signed)
Subjective:   Duane [name] is a 67 y.o. male who presents for Medicare Annual/Subsequent preventive examination.  Review of Systems:  N/A Cardiac Risk Factors include: advanced age (>35men, >56 women);dyslipidemia;hypertension;male gender;sedentary lifestyle     Objective:    Vitals: BP 112/62 (BP Location: Left Arm, Patient Position: Sitting, Cuff Size: Normal)   Pulse 62   Temp 99.2 F (37.3 C) (Oral)   Ht 6\' 4"  (1.93 m)   Wt 210 lb 9.6 oz (95.5 kg)   SpO2 94%   BMI 25.64 kg/m   Body mass index is 25.64 kg/m.  Advanced Directives 03/12/2018 05/23/2017 02/19/2017 12/26/2016 11/21/2016 08/27/2016 08/21/2016  Does Patient Have a Medical Advance Directive? No No No No No No No  Would patient like information on creating a medical advance directive? Yes (MAU/Ambulatory/Procedural Areas - Information given) - - - - No - patient declined information No - patient declined information    Tobacco Social History   Tobacco Use  Smoking Status Never Smoker  Smokeless Tobacco Never Used  Tobacco Comment   smoking cessation materials not required     Counseling given: No Comment: smoking cessation materials not required  Clinical Intake:  Pre-visit preparation completed: Yes  Pain : No/denies pain   BMI - recorded: 25.64 Nutritional Status: BMI 25 -29 Overweight Nutritional Risks: None Diabetes: No  How often do you need to have someone help you when you read instructions, pamphlets, or other written materials from your doctor or pharmacy?: 1 - Never  Interpreter Needed?: No  Information entered by :: AEversole, LPN  Hospitalizations/ED visits and surgeries occurring within the previous 12 months:  Within the previous 12 months, pt has not underwent any surgical procedures, has not been hospitalized for any conditions and has not been treated by an emergency room clinician.  Past Medical History:  Diagnosis Date  . A-fib [facility])    requiring life-long  anticouagulation  . Cerebellar infarct ([place])   . CKD (chronic kidney disease)    creatinine baseline 1.3-1.5 in 2015  . GERD (gastroesophageal reflux disease)   . Gout   . Graves disease   . H/O hyperkalemia    with Bactrim  . Hyperlipidemia    requiring life long statin therapy  . Hyperparathyroidism, secondary renal ([place]) 10/02/2017  . Hypertension   . Hypothyroidism   . Left hemiparesis [facility]) May 2015   s/p strke  . Poor short term memory   . Renal insufficiency   . Restrictive lung disease    mild  . Stroke ([place]) 02/20/14   left side hemiparesis, dysphagia  . UTI (urinary tract infection) due to Enterococcus May 2015  . Vitamin D deficiency disease    Past Surgical History:  Procedure Laterality Date  . CATARACT EXTRACTION W/PHACO Right 08/27/2016   Procedure: CATARACT EXTRACTION PHACO AND INTRAOCULAR LENS PLACEMENT (IOC);  Surgeon: Eulogio Bear, MD;  Location: [facility];  Service: Ophthalmology;  Laterality: Right;  RIGHT  . ENTEROSTOMY CLOSURE  11/27/15   ileostomy takedown  . EXPLORATORY LAPAROTOMY  11/27/15  . illeostomy  07/2014  . POLYPECTOMY  10/15   GI surgery to remove polyp   Family History  Problem Relation Age of Onset  . Arthritis Mother   . Heart disease Mother   . Heart attack Father   . Heart disease Father   . Cirrhosis Sister   . Hyperparathyroidism Neg Hx    Social History   Socioeconomic History  . Marital status: Married  Spouse name: Curt Bears  . Number of children: 2  . Years of education: Not on file  . Highest education level: 12th grade  Occupational History  . Occupation: Retired  Scientific laboratory technician  . Financial resource strain: Not hard at all  . Food insecurity:    Worry: Never true    Inability: Never true  . Transportation needs:    Medical: No    Non-medical: No  Tobacco Use  . Smoking status: Never Smoker  . Smokeless tobacco: Never Used  . Tobacco comment: smoking cessation materials not required  Substance  and Sexual Activity  . Alcohol use: No  . Drug use: No  . Sexual activity: Not Currently  Lifestyle  . Physical activity:    Days per week: 0 days    Minutes per session: 0 min  . Stress: Rather much  Relationships  . Social connections:    Talks on phone: Patient refused    Gets together: Patient refused    Attends religious service: Patient refused    Active member of club or organization: Patient refused    Attends meetings of clubs or organizations: Patient refused    Relationship status: Patient refused  Other Topics Concern  . Not on file  Social History Narrative  . Not on file    Outpatient Encounter Medications as of 03/12/2018  Medication Sig  . allopurinol (ZYLOPRIM) 300 MG tablet Take 300 mg by mouth daily.  Marland Kitchen amiodarone (PACERONE) 200 MG tablet Take 200 mg by mouth daily.  Marland Kitchen apixaban (ELIQUIS) 5 MG TABS tablet Take 1 tablet (5 mg total) by mouth 2 (two) times daily.  Marland Kitchen atorvastatin (LIPITOR) 40 MG tablet TAKE 1 & 1/2 (ONE & ONE-HALF) TABLETS BY MOUTH AT BEDTIME  . calcitRIOL (ROCALTROL) 0.25 MCG capsule Take 0.25 mcg daily by mouth.   . Cholecalciferol (VITAMIN D3) 2000 units capsule Take 2,000 Units by mouth daily.  . cyanocobalamin 500 MCG tablet Take 500 mcg by mouth daily.  Marland Kitchen doxazosin (CARDURA) 1 MG tablet TAKE 1 TABLET BY MOUTH ONCE DAILY  . famotidine (PEPCID) 20 MG tablet TAKE 1 TABLET BY MOUTH TWICE DAILY  . ferrous sulfate 325 (65 FE) MG tablet Take 325 mg by mouth 3 (three) times daily with meals.   Marland Kitchen FLUoxetine (PROZAC) 40 MG capsule TAKE ONE CAPSULE BY MOUTH ONCE DAILY  . levothyroxine (SYNTHROID, LEVOTHROID) 88 MCG tablet Take 1 tablet (88 mcg total) by mouth daily.  . methylphenidate (RITALIN) 5 MG tablet Take 1 tablet (5 mg total) by mouth 2 (two) times daily with breakfast and lunch. If needed for attention or focus  . mirtazapine (REMERON) 15 MG tablet TAKE 1 TABLET BY MOUTH AT BEDTIME  . ezetimibe (ZETIA) 10 MG tablet Take 1 tablet (10 mg total)  by mouth daily.   No facility-administered encounter medications on file as of 03/12/2018.     Activities of Daily Living In your present state of health, do you have any difficulty performing the following activities: 03/12/2018 02/19/2018  Hearing? N N  Comment denies hearing aids -  Vision? N N  Comment denies eyeglasses -  Difficulty concentrating or making decisions? Y N  Comment short term memory loss -  Walking or climbing stairs? N N  Dressing or bathing? N N  Doing errands, shopping? N N  Preparing Food and eating ? N -  Comment denies dentures -  Using the Toilet? N -  In the past six months, have you accidently leaked urine? N -  Do you have problems with loss of bowel control? N -  Managing your Medications? N -  Managing your Finances? N -  Housekeeping or managing your Housekeeping? N -  Some recent data might be hidden    Patient Care Team: Lada, Satira Anis, MD as PCP - General (Family Medicine) Lada, Satira Anis, MD as PCP - Family Medicine (Family Medicine) Ubaldo Glassing Javier Docker, MD as Consulting Physician (Cardiology) Anthonette Legato, MD (Internal Medicine) Emmaline Kluver., MD as Consulting Physician (Rheumatology)   Assessment:   This is a routine wellness examination for [place].  Exercise Activities and Dietary recommendations Current Exercise Habits: The patient does not participate in regular exercise at present, Exercise limited by: None identified  Goals    . DIET - INCREASE WATER INTAKE     Recommend to drink at least 6-8 8oz glasses of water per day.       Fall Risk Fall Risk  03/12/2018 02/19/2018 11/21/2017 08/21/2017 05/23/2017  Falls in the past year? No No No No No  Risk for fall due to : Medication side effect - - - -   FALL RISK PREVENTION PERTAINING TO HOME: Is your home free of loose throw rugs in walkways, pet beds, electrical cords, etc? Yes Is there adequate lighting in your home to reduce risk of falls?  Yes Are there stairs in or  around your home WITH handrails? Yes  ASSISTIVE DEVICES UTILIZED TO PREVENT FALLS: Use of a cane, walker or w/c? No Grab bars in the bathroom? Yes  Shower chair or a place to sit while bathing? Yes An elevated toilet seat or a handicapped toilet? Yes  Timed Get Up and Go Performed: Yes. Pt ambulated 10 feet within 12 sec. Gait slow, steady and without the use of an assistive device. No intervention required at this time. Fall risk prevention has been discussed.  Community Resource Referral:  [place] Referral not required at this time.   Depression Screen PHQ 2/9 Scores 03/12/2018 02/19/2018 11/21/2017 08/21/2017  PHQ - 2 Score 2 0 0 1  PHQ- 9 Score 8 - - -    Cognitive Function     6CIT Screen 03/12/2018  What Year? 0 points  What month? 0 points  What time? 3 points  Count back from 20 0 points  Months in reverse 0 points  Repeat phrase 4 points  Total Score 7    Immunization History  Administered Date(s) Administered  . Influenza, High Dose Seasonal PF 11/21/2016, 09/09/2017  . Pneumococcal Conjugate-13 12/26/2016  . Pneumococcal Polysaccharide-23 03/12/2018  . Tdap 01/10/2016    Qualifies for Shingles Vaccine? Yes. Due for Shingrix. Education has been provided regarding the importance of this vaccine. Pt has been advised to call his insurance company to determine his out of pocket expense. Advised he may also receive this vaccine at his local pharmacy or Health Dept. Verbalized acceptance and understanding.  Screening Tests Health Maintenance  Topic Date Due  . INFLUENZA VACCINE  05/14/2018  . COLONOSCOPY  07/07/2024  . TETANUS/TDAP  01/09/2026  . Hepatitis C Screening  Completed  . PNA vac Low Risk Adult  Completed   Cancer Screenings: Lung: Low Dose CT Chest recommended if Age 74-80 years, 30 pack-year currently smoking OR have quit w/in 15years. Patient does not qualify. Colorectal: Completed 07/07/14. Repeat every 10 years  Additional  Screenings: Hepatitis C Screening: Completed 06/23/17     Plan:  I have personally reviewed and addressed the Medicare Annual Wellness questionnaire and  have noted the following in the patient's chart:  A. Medical and social history B. Use of alcohol, tobacco or illicit drugs  C. Current medications and supplements D. Functional ability and status E.  Nutritional status F.  Physical activity G. Advance directives H. List of other physicians I.  Hospitalizations, surgeries, and ER visits in previous 12 months J.  [place] such as hearing and vision if needed, cognitive and depression L. Referrals and appointments  In addition, I have reviewed and discussed with patient certain preventive protocols, quality metrics, and best practice recommendations. A written personalized care plan for preventive services as well as general preventive health recommendations were provided to patient.  See attached scanned questionnaire for additional information.   Signed,  Aleatha Borer, LPN Nurse Health Advisor

## 2018-03-23 DIAGNOSIS — D485 Neoplasm of uncertain behavior of skin: Secondary | ICD-10-CM | POA: Diagnosis not present

## 2018-03-23 DIAGNOSIS — L989 Disorder of the skin and subcutaneous tissue, unspecified: Secondary | ICD-10-CM | POA: Diagnosis not present

## 2018-03-23 DIAGNOSIS — L578 Other skin changes due to chronic exposure to nonionizing radiation: Secondary | ICD-10-CM | POA: Diagnosis not present

## 2018-03-23 DIAGNOSIS — C44319 Basal cell carcinoma of skin of other parts of face: Secondary | ICD-10-CM | POA: Diagnosis not present

## 2018-04-30 ENCOUNTER — Other Ambulatory Visit: Payer: Self-pay | Admitting: Family Medicine

## 2018-05-08 ENCOUNTER — Other Ambulatory Visit: Payer: Self-pay

## 2018-05-08 MED ORDER — METHYLPHENIDATE HCL 5 MG PO TABS: 5.0000 mg | ORAL_TABLET | Freq: Two times a day (BID) | ORAL | 0 refills | Status: DC

## 2018-05-08 NOTE — Telephone Encounter (Signed)
PMP Aware reviewed; no other prescribers; no red flags Rx sent

## 2018-05-22 ENCOUNTER — Ambulatory Visit (INDEPENDENT_AMBULATORY_CARE_PROVIDER_SITE_OTHER): Payer: Medicare HMO | Admitting: Family Medicine

## 2018-05-22 ENCOUNTER — Encounter: Payer: Self-pay | Admitting: Family Medicine

## 2018-05-22 VITALS — BP 124/60 | HR 65 | Temp 98.3°F | Resp 12 | Ht 76.0 in | Wt 200.1 lb

## 2018-05-22 DIAGNOSIS — D631 Anemia in chronic kidney disease: Secondary | ICD-10-CM | POA: Diagnosis not present

## 2018-05-22 DIAGNOSIS — I69398 Other sequelae of cerebral infarction: Secondary | ICD-10-CM | POA: Diagnosis not present

## 2018-05-22 DIAGNOSIS — R748 Abnormal levels of other serum enzymes: Secondary | ICD-10-CM

## 2018-05-22 DIAGNOSIS — R69 Illness, unspecified: Secondary | ICD-10-CM | POA: Diagnosis not present

## 2018-05-22 DIAGNOSIS — N2581 Secondary hyperparathyroidism of renal origin: Secondary | ICD-10-CM | POA: Diagnosis not present

## 2018-05-22 DIAGNOSIS — G8194 Hemiplegia, unspecified affecting left nondominant side: Secondary | ICD-10-CM | POA: Diagnosis not present

## 2018-05-22 DIAGNOSIS — Z125 Encounter for screening for malignant neoplasm of prostate: Secondary | ICD-10-CM | POA: Diagnosis not present

## 2018-05-22 DIAGNOSIS — I77811 Abdominal aortic ectasia: Secondary | ICD-10-CM | POA: Diagnosis not present

## 2018-05-22 DIAGNOSIS — F0631 Mood disorder due to known physiological condition with depressive features: Secondary | ICD-10-CM

## 2018-05-22 DIAGNOSIS — N184 Chronic kidney disease, stage 4 (severe): Secondary | ICD-10-CM | POA: Diagnosis not present

## 2018-05-22 HISTORY — DX: Abdominal aortic ectasia: I77.811

## 2018-05-22 LAB — COMPLETE METABOLIC PANEL WITH GFR
AG Ratio: 1.2 (calc) (ref 1.0–2.5)
ALBUMIN MSPROF: 3.3 g/dL — AB (ref 3.6–5.1)
ALT: 117 U/L — ABNORMAL HIGH (ref 9–46)
AST: 104 U/L — ABNORMAL HIGH (ref 10–35)
Alkaline phosphatase (APISO): 130 U/L — ABNORMAL HIGH (ref 40–115)
BILIRUBIN TOTAL: 0.8 mg/dL (ref 0.2–1.2)
BUN / CREAT RATIO: 14 (calc) (ref 6–22)
BUN: 33 mg/dL — AB (ref 7–25)
CHLORIDE: 110 mmol/L (ref 98–110)
CO2: 23 mmol/L (ref 20–32)
Calcium: 8.7 mg/dL (ref 8.6–10.3)
Creat: 2.39 mg/dL — ABNORMAL HIGH (ref 0.70–1.25)
GFR, EST AFRICAN AMERICAN: 32 mL/min/{1.73_m2} — AB (ref >=60)
GFR, EST NON AFRICAN AMERICAN: 27 mL/min/{1.73_m2} — AB (ref >=60)
GLUCOSE: 86 mg/dL (ref 65–139)
Globulin: 2.7 g/dL (calc) (ref 1.9–3.7)
Potassium: 4.3 mmol/L (ref 3.5–5.3)
Sodium: 139 mmol/L (ref 135–146)
TOTAL PROTEIN: 6 g/dL — AB (ref 6.1–8.1)

## 2018-05-22 LAB — LIPID PANEL
Cholesterol: 132 mg/dL (ref <200)
HDL: 40 mg/dL — ABNORMAL LOW (ref >40)
LDL Cholesterol (Calc): 72 mg/dL (calc)
Non-HDL Cholesterol (Calc): 92 mg/dL (calc) (ref <130)
TRIGLYCERIDES: 110 mg/dL (ref <150)
Total CHOL/HDL Ratio: 3.3 (calc) (ref <5.0)

## 2018-05-22 LAB — PSA: PSA: 0.3 ng/mL (ref <=4.0)

## 2018-05-22 NOTE — Assessment & Plan Note (Signed)
Continue current medicine ?

## 2018-05-22 NOTE — Assessment & Plan Note (Signed)
Managed by kidney doctor

## 2018-05-22 NOTE — Assessment & Plan Note (Addendum)
Noted on [place] Sept 2018; repeat in 2021; try to get LDL under 70; taking chol medicine; limit saturated fats

## 2018-05-22 NOTE — Assessment & Plan Note (Signed)
Managed by GI, note to see if MRCP still needed

## 2018-05-22 NOTE — Progress Notes (Signed)
BP 124/60   Pulse 65   Temp 98.3 F (36.8 C) (Oral)   Resp 12   Ht 6\' 4"  (1.93 m)   Wt 200 lb 1.6 oz (90.8 kg)   SpO2 96%   BMI 24.36 kg/m    Subjective:    Patient ID: Duane [name], male    DOB: May 27, 1951, 67 y.o.   MRN: [id number]  HPI: Duane [name] is a 67 y.o. male  Chief Complaint  Patient presents with  . Follow-up    HPI Patient is here for f/u  Patient has elevated LFTs; was referred to Dr. Vicente Males (GI); liver US was done on 06/30/2017; fatty liver suggested by increased hepatogenicity; he then was found to have elevated PTH and was referred to endocrinologist; an MRCP was ordered for further work-up; following fatty liver diet; no abdominal pain; appetite is good  High cholesterol; pretty much limiting fatty meats; getting Cheerios often; not much oatmeal Lab Results  Component Value Date   CHOL 132 05/22/2018   HDL 40 (L) 05/22/2018   LDLCALC 72 05/22/2018   TRIG 110 05/22/2018   CHOLHDL 3.3 05/22/2018   Gout; no recent gout flares; last Feb 2017 during ileostomy take-down  Hypothyroidism Lab Results  Component Value Date   TSH 0.72 02/19/2018   Renal disease; secondary hyperparathyroidism, managed by Dr. Holley Raring  Hx of stroke; on adderall for alertness; mostly taking just the morning dose; helps with staying awake; worth continuing wife thinks; no adverse side effects, not feeling shaky or jittery  CKD stage 4; avoiding NSAID; not a good water drinker, more Kool-Aid, sugar free  Depression screen [facility] 2/9 05/22/2018 03/12/2018 02/19/2018 11/21/2017 08/21/2017  Decreased Interest 0 1 0 0 0  Down, Depressed, Hopeless 0 1 0 0 1  PHQ - 2 Score 0 2 0 0 1  Altered sleeping 0 1 - - -  Tired, decreased energy 0 3 - - -  Change in appetite 0 0 - - -  Feeling bad or failure about yourself  0 1 - - -  Trouble concentrating 0 0 - - -  Moving slowly or fidgety/restless 0 1 - - -  Suicidal thoughts 0 0 - - -  PHQ-9 Score 0 8 - - -  Difficult doing  work/chores - Not difficult at all - - -    Relevant past medical, surgical, family and social history reviewed Past Medical History:  Diagnosis Date  . A-fib [facility])    requiring life-long anticouagulation  . Aortic ectasia, abdominal ([place]) 05/22/2018   [place] Sept 2018  . Cerebellar infarct ([place])   . CKD (chronic kidney disease)    creatinine baseline 1.3-1.5 in 2015  . GERD (gastroesophageal reflux disease)   . Gout   . Graves disease   . H/O hyperkalemia    with Bactrim  . Hyperlipidemia    requiring life long statin therapy  . Hyperparathyroidism, secondary renal ([place]) 10/02/2017  . Hypertension   . Hypothyroidism   . Left hemiparesis [facility]) May 2015   s/p strke  . Poor short term memory   . Renal insufficiency   . Restrictive lung disease    mild  . Stroke ([place]) 02/20/14   left side hemiparesis, dysphagia  . UTI (urinary tract infection) due to Enterococcus May 2015  . Vitamin D deficiency disease    Past Surgical History:  Procedure Laterality Date  . CATARACT EXTRACTION W/PHACO Right 08/27/2016   Procedure: CATARACT EXTRACTION PHACO AND INTRAOCULAR LENS PLACEMENT (IOC);  Surgeon: Eulogio Bear, MD;  Location: [facility];  Service: Ophthalmology;  Laterality: Right;  RIGHT  . ENTEROSTOMY CLOSURE  11/27/15   ileostomy takedown  . EXPLORATORY LAPAROTOMY  11/27/15  . illeostomy  07/2014  . POLYPECTOMY  10/15   GI surgery to remove polyp   Family History  Problem Relation Age of Onset  . Arthritis Mother   . Heart disease Mother   . Heart attack Father   . Heart disease Father   . Cirrhosis Sister   . Hyperparathyroidism Neg Hx    Social History   Tobacco Use  . Smoking status: Never Smoker  . Smokeless tobacco: Never Used  . Tobacco comment: smoking cessation materials not required  Substance Use Topics  . Alcohol use: No  . Drug use: No    Interim medical history since last visit reviewed. Allergies and medications reviewed  Review of  Systems Per HPI unless specifically indicated above     Objective:    BP 124/60   Pulse 65   Temp 98.3 F (36.8 C) (Oral)   Resp 12   Ht 6\' 4"  (1.93 m)   Wt 200 lb 1.6 oz (90.8 kg)   SpO2 96%   BMI 24.36 kg/m   Wt Readings from Last 3 Encounters:  05/22/18 200 lb 1.6 oz (90.8 kg)  03/12/18 210 lb 9.6 oz (95.5 kg)  02/19/18 205 lb (93 kg)    Physical Exam  Constitutional: He appears well-developed and well-nourished. No distress.  HENT:  Head: Normocephalic and atraumatic.  Eyes: EOM are normal. No scleral icterus.  Neck: No thyromegaly present.  Cardiovascular: Normal rate and regular rhythm.  Pulmonary/Chest: Effort normal and breath sounds normal.  Abdominal: Soft. Bowel sounds are normal. He exhibits no distension.  Musculoskeletal: He exhibits no edema.  Neurological: He is alert.  Rather still, not much movement or interaction but responsive, answers questions  Skin: Skin is warm and dry. No pallor.  Psychiatric: Thought content normal. His affect is blunt. He is slowed.  Limited eye contact with examiner; answers questions      Assessment & Plan:   Problem List Items Addressed This Visit      Cardiovascular and Mediastinum   Aortic ectasia, abdominal ([place]) - Primary    Noted on [place] Sept 2018; repeat in 2021; try to get LDL under 70; taking chol medicine; limit saturated fats      Relevant Orders   Lipid panel (Completed)     Endocrine   Hyperparathyroidism, secondary renal ([place])    Managed by kidney doctor        Nervous and Auditory   Left hemiparesis ([place])     Genitourinary   Chronic kidney disease, stage IV (severe) ([place]) (Chronic)    Managed by nephrologist; avoid NSAIDs      Anemia, chronic renal failure    Managed by kidney doctor        Other   Depression due to old stroke (Chronic)    Continue current medicine      Alkaline phosphatase elevation    Managed by GI, note to see if MRCP still needed      Relevant Orders    COMPLETE METABOLIC PANEL WITH GFR (Completed)    Other Visit Diagnoses    Screening for prostate cancer       Relevant Orders   PSA (Completed)       Follow up plan: Return in about 3 months (around 08/22/2018).  An after-visit summary was  printed and given to the patient at [place].  Please see the patient instructions which may contain other information and recommendations beyond what is mentioned above in the assessment and plan.  No orders of the defined types were placed in this encounter.   Orders Placed This Encounter  Procedures  . PSA  . Lipid panel  . COMPLETE METABOLIC PANEL WITH GFR

## 2018-05-22 NOTE — Assessment & Plan Note (Signed)
Managed by nephrologist; avoid NSAIDs 

## 2018-05-24 ENCOUNTER — Other Ambulatory Visit: Payer: Self-pay | Admitting: Family Medicine

## 2018-05-25 ENCOUNTER — Telehealth: Payer: Self-pay

## 2018-05-25 ENCOUNTER — Other Ambulatory Visit: Payer: Self-pay

## 2018-05-25 DIAGNOSIS — R945 Abnormal results of liver function studies: Secondary | ICD-10-CM

## 2018-05-25 NOTE — Telephone Encounter (Signed)
Contacted patients wife.  Informed her that her husband needs to have an MRCP to evaluate his  Abnormal liver function test and we will follow up with him with an office visit also.  She said she will need to check her schedule and get back to [place] to let us know when she can schedule.  Thanks [employer]

## 2018-05-25 NOTE — Telephone Encounter (Signed)
-----  Message from Jonathon Bellows, MD sent at 05/25/2018 11:21 AM EDT ----- Regarding: FW: Did patient ever get the MRCP? Sharyn Lull  This patient needs a follow up. Prior to follow up needs a MRCP to evaluate abnormal LFT's. Can you arrange please  C/c Dr Georgina Pillion ----- Message ----- From: Arnetha Courser, MD Sent: 05/22/2018   3:53 PM EDT To: Jonathon Bellows, MD Subject: RE: Did patient ever get the MRCP?             Greetings! We should get the alk phos back later today or tomorrow. I'll message you and let you know that that result is. In the meantime, hope you have a wonderful weekend! Peace, Rip Harbour  ----- Message ----- From: Jonathon Bellows, MD Sent: 05/22/2018  11:46 AM EDT To: Glennie Isle, CMA, Arnetha Courser, MD Subject: RE: Did patient ever get the MRCP?             Good morning  I think we may have held off on the MRCP because her PTH was elevated and I felt the alk phos was probably related to elevated PTH.   I would think that we should see which way his alk phos is going , if rising despite control of the PTH then probabky can get MRCP  Let me know your thoughts   I would be happy to see him and follow up with him    Kiran ----- Message ----- From: Arnetha Courser, MD Sent: 05/22/2018  11:28 AM EDT To: Jonathon Bellows, MD Subject: Did patient ever get the MRCP?                 Hi! I'm seeing our mutual patient this mroning. Just looking through chart and can't find the MRCP that you suggested last fall. Just a note to ask if still needed... Thank you! Rip Harbour

## 2018-05-26 ENCOUNTER — Encounter: Payer: Self-pay | Admitting: Family Medicine

## 2018-05-26 ENCOUNTER — Telehealth: Payer: Self-pay

## 2018-05-26 ENCOUNTER — Other Ambulatory Visit: Payer: Self-pay

## 2018-05-26 DIAGNOSIS — R945 Abnormal results of liver function studies: Secondary | ICD-10-CM

## 2018-05-26 NOTE — Telephone Encounter (Signed)
Copied from [place] [phone]. Topic: General - Other >> May 26, 2018  9:47 AM Alfredia Ferguson R wrote: Pt needs a note excusing him from [employer] due to his health. Needs to be done and mailed ASAP. Madaline Savage Duty is scheduled for 06/23/2018

## 2018-05-26 NOTE — Telephone Encounter (Signed)
I will be glad to write him a letter; will print and put in black box

## 2018-05-26 NOTE — Telephone Encounter (Signed)
MRCP ordered for 06/08/18 at [facility].  Wife has been notified to arrive at [facility] at 7:30am Nothing to eat or drink 4 hours prior.  Due to patients elevated creatinine levels-contrast will not be used.  Order will be changed to reflect this.  Thanks [employer]

## 2018-05-27 NOTE — Telephone Encounter (Signed)
Pt wife notified ready

## 2018-06-08 ENCOUNTER — Other Ambulatory Visit: Payer: Self-pay | Admitting: Family Medicine

## 2018-06-08 ENCOUNTER — Ambulatory Visit: Payer: Medicare HMO

## 2018-06-12 DIAGNOSIS — M109 Gout, unspecified: Secondary | ICD-10-CM | POA: Diagnosis not present

## 2018-06-12 DIAGNOSIS — D631 Anemia in chronic kidney disease: Secondary | ICD-10-CM | POA: Diagnosis not present

## 2018-06-12 DIAGNOSIS — N2581 Secondary hyperparathyroidism of renal origin: Secondary | ICD-10-CM | POA: Diagnosis not present

## 2018-06-12 DIAGNOSIS — N184 Chronic kidney disease, stage 4 (severe): Secondary | ICD-10-CM | POA: Diagnosis not present

## 2018-06-16 ENCOUNTER — Telehealth: Payer: Self-pay | Admitting: Gastroenterology

## 2018-06-16 NOTE — Telephone Encounter (Signed)
Anderson Malta from [place] in behalf of [place] left vm to inform Dr. Vicente Males that the request was denied any questions call [phone] ref # [id number]

## 2018-06-24 NOTE — Telephone Encounter (Signed)
Pt's insurance has denied pt's MRCP for tomorrow. Please advise.

## 2018-06-24 NOTE — Telephone Encounter (Signed)
Can we appeal?

## 2018-06-25 ENCOUNTER — Ambulatory Visit: Payer: Medicare HMO

## 2018-06-25 ENCOUNTER — Ambulatory Visit: Admission: RE | Admit: 2018-06-25 | Payer: Medicare HMO | Source: Ambulatory Visit

## 2018-06-26 NOTE — Telephone Encounter (Signed)
Appeal information was given to Dr. Vicente Males. Will start appeal process.

## 2018-07-07 ENCOUNTER — Other Ambulatory Visit: Payer: Self-pay | Admitting: Family Medicine

## 2018-07-07 DIAGNOSIS — R945 Abnormal results of liver function studies: Secondary | ICD-10-CM

## 2018-07-07 DIAGNOSIS — R7989 Other specified abnormal findings of blood chemistry: Secondary | ICD-10-CM

## 2018-07-08 ENCOUNTER — Other Ambulatory Visit: Payer: Self-pay

## 2018-07-09 MED ORDER — METHYLPHENIDATE HCL 5 MG PO TABS: 5.0000 mg | ORAL_TABLET | Freq: Two times a day (BID) | ORAL | 0 refills | Status: DC

## 2018-07-09 NOTE — Telephone Encounter (Signed)
Relation to pt: self  Call back [place]: [place], [place] - [place] [phone] ([phone] (Fax)    Reason for call:  Wife checking on the status of methylphenidate (RITALIN) 5 MG tablet, informed spouse in the future please allow 72 hour turn around time. Patient going out of town tonight returning Sunday,  Please advise

## 2018-07-09 NOTE — Telephone Encounter (Signed)
PMP Aware reviewed; no red flags Rx sent as requested

## 2018-08-04 DIAGNOSIS — Z8673 Personal history of transient ischemic attack (TIA), and cerebral infarction without residual deficits: Secondary | ICD-10-CM | POA: Diagnosis not present

## 2018-08-04 DIAGNOSIS — I480 Paroxysmal atrial fibrillation: Secondary | ICD-10-CM | POA: Diagnosis not present

## 2018-08-04 DIAGNOSIS — E782 Mixed hyperlipidemia: Secondary | ICD-10-CM | POA: Diagnosis not present

## 2018-08-21 ENCOUNTER — Encounter: Payer: Self-pay | Admitting: Family Medicine

## 2018-08-21 ENCOUNTER — Telehealth: Payer: Self-pay | Admitting: Family Medicine

## 2018-08-21 ENCOUNTER — Ambulatory Visit (INDEPENDENT_AMBULATORY_CARE_PROVIDER_SITE_OTHER): Payer: Medicare HMO | Admitting: Family Medicine

## 2018-08-21 VITALS — BP 128/62 | HR 73 | Temp 97.7°F | Ht 76.0 in | Wt 201.1 lb

## 2018-08-21 DIAGNOSIS — I480 Paroxysmal atrial fibrillation: Secondary | ICD-10-CM

## 2018-08-21 DIAGNOSIS — I69398 Other sequelae of cerebral infarction: Secondary | ICD-10-CM | POA: Diagnosis not present

## 2018-08-21 DIAGNOSIS — I10 Essential (primary) hypertension: Secondary | ICD-10-CM

## 2018-08-21 DIAGNOSIS — Z79899 Other long term (current) drug therapy: Secondary | ICD-10-CM | POA: Diagnosis not present

## 2018-08-21 DIAGNOSIS — Z23 Encounter for immunization: Secondary | ICD-10-CM | POA: Diagnosis not present

## 2018-08-21 DIAGNOSIS — Z5181 Encounter for therapeutic drug level monitoring: Secondary | ICD-10-CM | POA: Diagnosis not present

## 2018-08-21 DIAGNOSIS — E782 Mixed hyperlipidemia: Secondary | ICD-10-CM

## 2018-08-21 DIAGNOSIS — N184 Chronic kidney disease, stage 4 (severe): Secondary | ICD-10-CM | POA: Diagnosis not present

## 2018-08-21 DIAGNOSIS — R748 Abnormal levels of other serum enzymes: Secondary | ICD-10-CM

## 2018-08-21 DIAGNOSIS — Z8739 Personal history of other diseases of the musculoskeletal system and connective tissue: Secondary | ICD-10-CM | POA: Diagnosis not present

## 2018-08-21 DIAGNOSIS — F0631 Mood disorder due to known physiological condition with depressive features: Secondary | ICD-10-CM

## 2018-08-21 MED ORDER — FLUOXETINE HCL 20 MG PO TABS: 20.0000 mg | ORAL_TABLET | Freq: Every day | ORAL | 3 refills | Status: DC

## 2018-08-21 MED ORDER — METHYLPHENIDATE HCL 5 MG PO TABS: 5.0000 mg | ORAL_TABLET | Freq: Every day | ORAL | 0 refills | Status: DC

## 2018-08-21 MED ORDER — ATORVASTATIN CALCIUM 10 MG PO TABS: 10.0000 mg | ORAL_TABLET | Freq: Every day | ORAL | 1 refills | Status: DC

## 2018-08-21 NOTE — Progress Notes (Signed)
BP 128/62   Pulse 73   Temp 97.7 F (36.5 C) (Oral)   Ht '6\' 4"'  (1.93 m)   Wt 201 lb 1.6 oz (91.2 kg)   SpO2 99%   BMI 24.48 kg/m    Subjective:    Patient ID: Duane [name], male    DOB: [birth year], 67 y.o.   MRN: [id number]  HPI: Duane [name] is a 68 y.o. male  Chief Complaint  Patient presents with  . Follow-up    HPI Patient is here for follow-up with his wife  High cholesterol; was supposed to be on zetia and lipitor, but not taking zetia; last LDL was 72 He was just trying diet plus atorvastatin; kidney doctor would be okay with 80 mg but he has liver disease too; he wanted an MRI of the liver but insurance wouldn't cover it Creatinine was 2.0 on Oct 22nd  Paroxysmal afib; no bleeding on eliquis Dr. Ubaldo Glassing knocked his amiodarone down; took more liver tests Reviewed last liver tests; done Oct 22nd, AST of 137, ALT 147, alk phos of 118 BP that day 118/62  Glucose normal at 87 Mildly low Ca2+ but alburmin was 3.3  No abdominal pain; appt is good  Mood is doing okay; on ritalin for alertness and mood after the stroke; taking one ritalin a day  Gout; no recent flares in years, 2015 was the last bad one; maybe a little bit once when he has his takedown in Feb 2017, just int the fingers, very mild  Depression screen [facility] 2/9 08/21/2018 08/21/2018 05/22/2018 03/12/2018 02/19/2018  Decreased Interest 0 0 0 1 0  Down, Depressed, Hopeless 0 0 0 1 0  PHQ - 2 Score 0 0 0 2 0  Altered sleeping 0 0 0 1 -  Tired, decreased energy 3 0 0 3 -  Change in appetite 0 0 0 0 -  Feeling bad or failure about yourself  0 0 0 1 -  Trouble concentrating 0 0 0 0 -  Moving slowly or fidgety/restless 0 0 0 1 -  Suicidal thoughts 0 0 0 0 -  PHQ-9 Score 3 0 0 8 -  Difficult doing work/chores Not difficult at all Not difficult at all - Not difficult at all -   Fall Risk  08/21/2018 05/22/2018 03/12/2018 02/19/2018 11/21/2017  Falls in the past year? 0 No No No No  Number falls in past  yr: 0 - - - -  Risk for fall due to : - - Medication side effect - -    Relevant past medical, surgical, family and social history reviewed Past Medical History:  Diagnosis Date  . A-fib [facility])    requiring life-long anticouagulation  . Aortic ectasia, abdominal ([place]) 05/22/2018   [place] Sept 2018  . Cerebellar infarct ([place])   . CKD (chronic kidney disease)    creatinine baseline 1.3-1.5 in 2015  . GERD (gastroesophageal reflux disease)   . Gout   . Graves disease   . H/O hyperkalemia    with Bactrim  . Hyperlipidemia    requiring life long statin therapy  . Hyperparathyroidism, secondary renal ([place]) 10/02/2017  . Hypertension   . Hypothyroidism   . Left hemiparesis [facility]) May 2015   s/p strke  . Poor short term memory   . Renal insufficiency   . Restrictive lung disease    mild  . Stroke ([place]) 02/20/14   left side hemiparesis, dysphagia  . UTI (urinary tract infection) due to Enterococcus May  2015  . Vitamin D deficiency disease    Past Surgical History:  Procedure Laterality Date  . CATARACT EXTRACTION W/PHACO Right 08/27/2016   Procedure: CATARACT EXTRACTION PHACO AND INTRAOCULAR LENS PLACEMENT (IOC);  Surgeon: Eulogio Bear, MD;  Location: [place] [facility];  Service: Ophthalmology;  Laterality: Right;  RIGHT  . ENTEROSTOMY CLOSURE  11/27/15   ileostomy takedown  . EXPLORATORY LAPAROTOMY  11/27/15  . illeostomy  07/2014  . POLYPECTOMY  10/15   GI surgery to remove polyp   Family History  Problem Relation Age of Onset  . Arthritis Mother   . Heart disease Mother   . Heart attack Father   . Heart disease Father   . Cirrhosis Sister   . Hyperparathyroidism Neg Hx    Social History   Tobacco Use  . Smoking status: Never Smoker  . Smokeless tobacco: Never Used  . Tobacco comment: smoking cessation materials not required  Substance Use Topics  . Alcohol use: No  . Drug use: No     Office Visit from 08/21/2018 in [facility]  AUDIT-C Score   0      Interim medical history since last visit reviewed. Allergies and medications reviewed  Review of Systems Per HPI unless specifically indicated above     Objective:    BP 128/62   Pulse 73   Temp 97.7 F (36.5 C) (Oral)   Ht '6\' 4"'  (1.93 m)   Wt 201 lb 1.6 oz (91.2 kg)   SpO2 99%   BMI 24.48 kg/m   Wt Readings from Last 3 Encounters:  08/21/18 201 lb 1.6 oz (91.2 kg)  05/22/18 200 lb 1.6 oz (90.8 kg)  03/12/18 210 lb 9.6 oz (95.5 kg)    Physical Exam  Constitutional: He appears well-developed and well-nourished. No distress.  HENT:  Head: Normocephalic and atraumatic.  Eyes: EOM are normal. No scleral icterus.  Neck: No thyromegaly present.  Cardiovascular: Normal rate and regular rhythm.  Pulmonary/Chest: Effort normal and breath sounds normal.  Abdominal: Soft. Bowel sounds are normal. He exhibits no distension.  Musculoskeletal: He exhibits no edema.  Neurological: Coordination normal.  Skin: Skin is warm and dry. No pallor.  Psychiatric: He has a normal mood and affect. His behavior is normal. Judgment and thought content normal.      Assessment & Plan:   Problem List Items Addressed This Visit      Cardiovascular and Mediastinum   Hypertension (Chronic)    controlled      A-fib (HCC)    Life-long anticoagulation; no excessive bleeding; seeing cardiologist        Genitourinary   Chronic kidney disease, stage IV (severe) (HCC) (Chronic)    Avoid NSAIDs completely; hydration        Other   Controlled substance agreement signed   Relevant Orders   Urine Drug Screen w/Alc, no confirm (Completed)   Hyperlipidemia    Awaiting word about what to do with his statin      History of gout    No recent flares; continue medicine management      Depression due to old stroke (Chronic)    On low dose ritalin; prescribed here; controlled substance agreement; reviewed PMP Aware      Relevant Medications   FLUoxetine (PROZAC) 20 MG tablet    Alkaline phosphatase elevation    Managed by GI; communication to see what to do with his statin       Other Visit Diagnoses  Need for influenza vaccination    -  Primary   Relevant Orders   Flu vaccine HIGH DOSE PF (Fluzone High dose) (Completed)   Medication monitoring encounter       Relevant Orders   Urine Drug Screen w/Alc, no confirm (Completed)       Follow up plan: Return in about 7 weeks (around 10/09/2018) for labs only; 3 months with Dr. Sanda Klein.  An after-visit summary was printed and given to the patient at [place].  Please see the patient instructions which may contain other information and recommendations beyond what is mentioned above in the assessment and plan.  Meds ordered this encounter  Medications  . FLUoxetine (PROZAC) 20 MG tablet    Sig: Take 1 tablet (20 mg total) by mouth daily. (lower dose)    Dispense:  30 tablet    Refill:  3    We'll do the lower dose; cancel the 40 mg strength  . methylphenidate (RITALIN) 5 MG tablet    Sig: Take 1 tablet (5 mg total) by mouth daily. If needed for attention or focus    Dispense:  90 tablet    Refill:  0    Orders Placed This Encounter  Procedures  . Flu vaccine HIGH DOSE PF (Fluzone High dose)  . Urine Drug Screen w/Alc, no confirm

## 2018-08-21 NOTE — Telephone Encounter (Signed)
Pt wife notified.

## 2018-08-21 NOTE — Patient Instructions (Addendum)
I will recommend that you stop the atorvastatin UNTIL we hear from your specialists about this drug We'll see what we'll do about your cholesterol medicine and then check labs in 6 weeks for that Try to limit saturated fats in your diet (bologna, hot dogs, barbeque, cheeseburgers, hamburgers, steak, bacon, sausage, cheese, etc.) and get more fresh fruits, vegetables, and whole grains

## 2018-08-21 NOTE — Telephone Encounter (Signed)
Pt's wife call dr Leamon Arnt office

## 2018-08-21 NOTE — Telephone Encounter (Signed)
Please let the patient know that Dr. Vicente Males was in agreement with decreasing the statin STOP taking 60 mg of atorvastatin We'll use just 10 mg of atorvastatin daily (new Rx sent) Recheck liver enzymes in 2 weeks (ORDER hepatic function panel) Please CALL Dr. Bethanne Ginger office about whether or not he will stop amiodarone; ask him to look at messages from me and Dr. Vicente Males (GI) in [place] and then have his office call the patient about what to do with amiodarone

## 2018-08-22 LAB — DRUG SCREEN URINE W/ALC, NO CONF
ALCOHOL, ETHYL (U): NEGATIVE
AMPHETAMINES (1000 NG/ML SCRN): NEGATIVE
BARBITURATES: NEGATIVE
BENZODIAZEPINES: NEGATIVE
COCAINE METABOLITES: NEGATIVE
MARIJUANA MET (50 NG/ML SCRN): NEGATIVE
METHADONE: NEGATIVE
METHAQUALONE: NEGATIVE
OPIATES: NEGATIVE
PHENCYCLIDINE: NEGATIVE
PROPOXYPHENE: NEGATIVE

## 2018-08-24 ENCOUNTER — Telehealth: Payer: Self-pay | Admitting: Family Medicine

## 2018-08-24 ENCOUNTER — Other Ambulatory Visit: Payer: Self-pay | Admitting: Cardiology

## 2018-08-24 NOTE — Telephone Encounter (Signed)
Please let the patient or his wife wife know that Dr. Ubaldo Glassing wants to STOP the amiodarone We are going to use just 10 mg of atorvastatin I'd like to recheck liver enzymes in two weeks as we discussed [it looks like they were already ordered, but released and not future so must be up front (?)]

## 2018-08-24 NOTE — Telephone Encounter (Signed)
Pt.notified

## 2018-08-24 NOTE — Telephone Encounter (Signed)
-----   Message from Teodoro Spray, MD sent at 08/24/2018  7:09 AM EST ----- Regarding: RE: elevated LFTs It looks like it is rising. Not certain if it is due to the amiodarone but will recommend stopping it.  ----- Message ----- From: Arnetha Courser, MD Sent: 08/21/2018  11:38 AM EST To: Teodoro Spray, MD, Jonathon Bellows, MD Subject: elevated LFTs                                  Hello teammates! I'm concerned about our mutual patient's SGPT. Last check was 147 with [facility] labs. He is currently on amiodarone and 60 mg of atorvastatin. Should these be discontinued (my gut feeling is yes, but he's got the afib and hx of stroke, so I need your help). Please let me know your thoughts. [place], [place] personal cell #

## 2018-09-07 NOTE — Assessment & Plan Note (Signed)
Life-long anticoagulation; no excessive bleeding; seeing cardiologist

## 2018-09-07 NOTE — Assessment & Plan Note (Signed)
On low dose ritalin; prescribed here; controlled substance agreement; reviewed PMP Aware

## 2018-09-07 NOTE — Assessment & Plan Note (Signed)
No recent flares; continue medicine management

## 2018-09-07 NOTE — Assessment & Plan Note (Signed)
Managed by GI; communication to see what to do with his statin

## 2018-09-07 NOTE — Assessment & Plan Note (Signed)
Awaiting word about what to do with his statin

## 2018-09-07 NOTE — Assessment & Plan Note (Signed)
controlled 

## 2018-09-07 NOTE — Assessment & Plan Note (Signed)
Avoid NSAIDs completely; hydration

## 2018-09-14 DIAGNOSIS — R748 Abnormal levels of other serum enzymes: Secondary | ICD-10-CM | POA: Diagnosis not present

## 2018-09-15 LAB — HEPATIC FUNCTION PANEL
AG Ratio: 1.1 (calc) (ref 1.0–2.5)
ALBUMIN MSPROF: 3.4 g/dL — AB (ref 3.6–5.1)
ALT: 76 U/L — AB (ref 9–46)
AST: 72 U/L — ABNORMAL HIGH (ref 10–35)
Alkaline phosphatase (APISO): 158 U/L — ABNORMAL HIGH (ref 40–115)
Bilirubin, Direct: 0.2 mg/dL (ref 0.0–0.2)
GLOBULIN: 3 g/dL (ref 1.9–3.7)
Indirect Bilirubin: 0.5 mg/dL (calc) (ref 0.2–1.2)
Total Bilirubin: 0.7 mg/dL (ref 0.2–1.2)
Total Protein: 6.4 g/dL (ref 6.1–8.1)

## 2018-10-02 DIAGNOSIS — N183 Chronic kidney disease, stage 3 (moderate): Secondary | ICD-10-CM | POA: Diagnosis not present

## 2018-10-02 DIAGNOSIS — I10 Essential (primary) hypertension: Secondary | ICD-10-CM | POA: Diagnosis not present

## 2018-10-02 DIAGNOSIS — M109 Gout, unspecified: Secondary | ICD-10-CM | POA: Diagnosis not present

## 2018-10-02 DIAGNOSIS — D631 Anemia in chronic kidney disease: Secondary | ICD-10-CM | POA: Diagnosis not present

## 2018-10-02 DIAGNOSIS — N2581 Secondary hyperparathyroidism of renal origin: Secondary | ICD-10-CM | POA: Diagnosis not present

## 2018-10-09 DIAGNOSIS — I10 Essential (primary) hypertension: Secondary | ICD-10-CM | POA: Diagnosis not present

## 2018-10-09 DIAGNOSIS — E785 Hyperlipidemia, unspecified: Secondary | ICD-10-CM | POA: Diagnosis not present

## 2018-10-09 DIAGNOSIS — E039 Hypothyroidism, unspecified: Secondary | ICD-10-CM | POA: Diagnosis not present

## 2018-10-09 DIAGNOSIS — I69354 Hemiplegia and hemiparesis following cerebral infarction affecting left non-dominant side: Secondary | ICD-10-CM | POA: Diagnosis not present

## 2018-10-09 DIAGNOSIS — D649 Anemia, unspecified: Secondary | ICD-10-CM | POA: Diagnosis not present

## 2018-10-09 DIAGNOSIS — K2970 Gastritis, unspecified, without bleeding: Secondary | ICD-10-CM | POA: Diagnosis not present

## 2018-10-09 DIAGNOSIS — K219 Gastro-esophageal reflux disease without esophagitis: Secondary | ICD-10-CM | POA: Diagnosis not present

## 2018-10-09 DIAGNOSIS — G8929 Other chronic pain: Secondary | ICD-10-CM | POA: Diagnosis not present

## 2018-10-09 DIAGNOSIS — I4891 Unspecified atrial fibrillation: Secondary | ICD-10-CM | POA: Diagnosis not present

## 2018-10-09 DIAGNOSIS — R69 Illness, unspecified: Secondary | ICD-10-CM | POA: Diagnosis not present

## 2018-10-16 DIAGNOSIS — M109 Gout, unspecified: Secondary | ICD-10-CM | POA: Diagnosis not present

## 2018-10-16 DIAGNOSIS — I482 Chronic atrial fibrillation, unspecified: Secondary | ICD-10-CM | POA: Diagnosis not present

## 2018-10-16 DIAGNOSIS — E872 Acidosis: Secondary | ICD-10-CM | POA: Diagnosis not present

## 2018-10-16 DIAGNOSIS — N183 Chronic kidney disease, stage 3 (moderate): Secondary | ICD-10-CM | POA: Diagnosis not present

## 2018-10-16 DIAGNOSIS — D631 Anemia in chronic kidney disease: Secondary | ICD-10-CM | POA: Diagnosis not present

## 2018-10-16 DIAGNOSIS — E7800 Pure hypercholesterolemia, unspecified: Secondary | ICD-10-CM | POA: Diagnosis not present

## 2018-10-18 ENCOUNTER — Other Ambulatory Visit: Payer: Self-pay | Admitting: Family Medicine

## 2018-10-19 DIAGNOSIS — Z85828 Personal history of other malignant neoplasm of skin: Secondary | ICD-10-CM | POA: Diagnosis not present

## 2018-10-19 DIAGNOSIS — L578 Other skin changes due to chronic exposure to nonionizing radiation: Secondary | ICD-10-CM | POA: Diagnosis not present

## 2018-10-19 DIAGNOSIS — D692 Other nonthrombocytopenic purpura: Secondary | ICD-10-CM | POA: Diagnosis not present

## 2018-10-19 DIAGNOSIS — L219 Seborrheic dermatitis, unspecified: Secondary | ICD-10-CM | POA: Diagnosis not present

## 2018-10-19 DIAGNOSIS — D1800 Hemangioma unspecified site: Secondary | ICD-10-CM | POA: Diagnosis not present

## 2018-10-19 DIAGNOSIS — Z1283 Encounter for screening for malignant neoplasm of skin: Secondary | ICD-10-CM | POA: Diagnosis not present

## 2018-10-19 DIAGNOSIS — D2262 Melanocytic nevi of left upper limb, including shoulder: Secondary | ICD-10-CM | POA: Diagnosis not present

## 2018-10-19 NOTE — Telephone Encounter (Signed)
I really need for the patient's liver to be evaluated before I prescribe any more statin See the phone note from 09/15/2018 Please contact patient / wife; let her know I really want patient to see liver doctor and find out what's going on before continuing the statin indefinitely I'll forward message to Dr. Vicente Males and ask him to look at our labs, as well as the high LFTs through [facility]

## 2018-10-19 NOTE — Telephone Encounter (Signed)
Patient's wife notified that rx for chol would not be filled and you did not want him taking until we can figure out what is going on with liver.  Pt wife notified that Dr. Vicente Males office should be in contact, but if not heard anything call them to setup appt.

## 2018-10-19 NOTE — Telephone Encounter (Signed)
Called pt to offer a follow up appointment with Dr. Vicente Males. I was able to speak with pt wife who states she plans to contact our office tomorrow to schedule after she confirms availability.

## 2018-10-20 ENCOUNTER — Other Ambulatory Visit: Payer: Self-pay

## 2018-10-20 DIAGNOSIS — R945 Abnormal results of liver function studies: Secondary | ICD-10-CM

## 2018-10-20 DIAGNOSIS — R7989 Other specified abnormal findings of blood chemistry: Secondary | ICD-10-CM

## 2018-10-21 ENCOUNTER — Encounter: Payer: Self-pay | Admitting: Gastroenterology

## 2018-10-21 ENCOUNTER — Ambulatory Visit: Payer: Medicare HMO | Admitting: Gastroenterology

## 2018-10-21 VITALS — BP 125/68 | HR 77 | Ht 76.0 in | Wt 206.4 lb

## 2018-10-21 DIAGNOSIS — R7989 Other specified abnormal findings of blood chemistry: Secondary | ICD-10-CM

## 2018-10-21 DIAGNOSIS — R945 Abnormal results of liver function studies: Secondary | ICD-10-CM | POA: Diagnosis not present

## 2018-10-21 NOTE — Progress Notes (Signed)
Jonathon Bellows MD, MRCP(U.K) [place]  [place], [place] [address]  Main: [phone]  Fax: [phone]   Primary Care Physician: Arnetha Courser, MD  Primary Gastroenterologist:  Dr. Jonathon Bellows   No chief complaint on file.   HPI: Duane [name] is a 68 y.o. male    Summary of history :  He was initially referred and seen back in 06/2017 for abnormal LFT's ongoing for over 3 years .H/o hypothyroidism , CVA, A fibb ,CKD 4 . Noted by Dr Sanda Klein to have an elevated alkaline phosphotase with normal isolated fractions. ANA is positive(1:160) ,No liver disease in the family, does not drink any alcohol., no illegal drug use. No tatoos. Denies any fractures. No over the counter meds, no herbal supplements.    Interval history   2018-10/21/2018   05/2017 : AMA negative, SM ab,LKM ab ,Immunoglobulins,celiac panel,A1AT,ceruloplasmin ,HIV,Hep B eab,Hep B c ab ,Hep A ab ,Hep BsAb/Ag, He C ab,Hep Beag,   Normal/negative, Vitamin D normal, GGT normal  PTH elevated  RUQ USG shows hepatic steatosis. Doing well, no new issues   Hepatic Function Latest Ref Rng & Units 09/14/2018 05/22/2018 02/19/2018  Total Protein 6.1 - 8.1 g/dL 6.4 6.0(L) 5.6(L)  Albumin 3.6 - 5.1 g/dL - - -  AST 10 - 35 U/L 72(H) 104(H) 61(H)  ALT 9 - 46 U/L 76(H) 117(H) 59(H)  Alk Phosphatase 40 - 115 U/L - - -  Total Bilirubin 0.2 - 1.2 mg/dL 0.7 0.8 0.7  Bilirubin, Direct 0.0 - 0.2 mg/dL 0.2 - 0.2       Current Outpatient Medications  Medication Sig Dispense Refill  . allopurinol (ZYLOPRIM) 300 MG tablet Take 300 mg by mouth daily.    Marland Kitchen apixaban (ELIQUIS) 5 MG TABS tablet Take 1 tablet (5 mg total) by mouth 2 (two) times daily. 180 tablet 3  . atorvastatin (LIPITOR) 10 MG tablet Take 1 tablet (10 mg total) by mouth at bedtime. 30 tablet 1  . calcitRIOL (ROCALTROL) 0.25 MCG capsule Take 0.25 mcg daily by mouth.     . Cholecalciferol (VITAMIN D3) 2000 units capsule Take 2,000 Units by mouth  daily.    . cyanocobalamin 500 MCG tablet Take 500 mcg by mouth daily.    Marland Kitchen doxazosin (CARDURA) 1 MG tablet TAKE 1 TABLET BY MOUTH ONCE DAILY 90 tablet 1  . famotidine (PEPCID) 20 MG tablet TAKE 1 TABLET BY MOUTH TWICE DAILY 180 tablet 3  . ferrous sulfate 325 (65 FE) MG tablet Take 325 mg by mouth 3 (three) times daily with meals.     Marland Kitchen FLUoxetine (PROZAC) 20 MG tablet Take 1 tablet (20 mg total) by mouth daily. (lower dose) 30 tablet 3  . levothyroxine (SYNTHROID, LEVOTHROID) 88 MCG tablet TAKE 1 TABLET BY MOUTH ONCE DAILY 90 tablet 3  . methylphenidate (RITALIN) 5 MG tablet Take 1 tablet (5 mg total) by mouth daily. If needed for attention or focus 90 tablet 0  . mirtazapine (REMERON) 15 MG tablet TAKE 1 TABLET BY MOUTH AT BEDTIME 90 tablet 1   No current facility-administered medications for this visit.     Allergies as of 10/21/2018 - Review Complete 08/21/2018  Allergen Reaction Noted  . Bactrim [sulfamethoxazole-trimethoprim] Other (See Comments) 05/04/2015  . Chlorpromazine Other (See Comments) 09/15/2015  . Cyclobenzaprine Other (See Comments) 01/10/2016  . Oxycodone Other (See Comments) 01/10/2016    ROS:  General: Negative for anorexia, weight loss, fever, chills, fatigue, weakness. ENT: Negative for hoarseness,  difficulty swallowing , nasal congestion. CV: Negative for chest pain, angina, palpitations, dyspnea on exertion, peripheral edema.  Respiratory: Negative for dyspnea at rest, dyspnea on exertion, cough, sputum, wheezing.  GI: See history of present illness. GU:  Negative for dysuria, hematuria, urinary incontinence, urinary frequency, nocturnal urination.  Endo: Negative for unusual weight change.    Physical Examination:   BP 125/68   Pulse 77   Ht '6\' 4"'  (1.93 m)   Wt 206 lb 6.4 oz (93.6 kg)   BMI 25.12 kg/m   General: Well-nourished, well-developed in no acute distress.  Eyes: No icterus. Conjunctivae pink. Mouth: Oropharyngeal mucosa moist and pink ,  no lesions erythema or exudate. Lungs: Clear to auscultation bilaterally. Non-labored. Heart: Regular rate and rhythm, no murmurs rubs or gallops.  Abdomen: Bowel sounds are normal, nontender, nondistended, no hepatosplenomegaly or masses, no abdominal bruits or hernia , no rebound or guarding.   Extremities: No lower extremity edema. No clubbing or deformities. Neuro: Alert and oriented x 3.  Grossly intact. Skin: Warm and dry, no jaundice.   Psych: Alert and cooperative, normal mood and affect.   Imaging Studies: No results found.  Assessment and Plan:   Duane [name] is a 68 y.o. y/o male here to follow up after over an year for abnormal LFT's . Reviewing labs suggests that he has had abnormal alkaline phosphatase for over 3 years. PTH has been elevated . Hyperparathyroidism can cause an elevated alk phos, seen by endocrinology and felt secondary to CKD. Marland Kitchen His transaminases have also been elevated recently and hepatic steatosis seen on USG, viral hepatitis, genetic disorders have been ruled out. Hepatic steatosis ie fatty liver can cause a similar picture. A MRCP request in the past has been denied by his insurance. I do note his amiodarone has been stopped recently and we can monitor his LFT's to see if this will help. Amiodarone too can cause a similar picture and has been stopped. At times it may also be multifactorial with a component from all of the above causes   Plan  1. Repeat GGT,CK,PTH-if PTH still elevated or rising then will send back to endocrinology, IF GGT elevated then suggests abnormality with the liver parenchyma then will need MRCP/Liver bx .   2. Check iron studies to evaluate for hemachromatosis.   3. If CK and GGT normal then can start on statin for lipids in 2-3 weeks after I recheck LFT's and ensure < 3 x upper limits of normal and I will be happy to monitor it . Suggest starting pravastatin - low dose as large studies have shown that it has no effect on  LFT's in comparison to placebo( studied over 112,000 patient years of exposure)  Dr Jonathon Bellows  MD,MRCP [facility] Follow up in 3 weeks

## 2018-10-22 LAB — CBC WITH DIFFERENTIAL/PLATELET
Basophils Absolute: 0.1 10*3/uL (ref 0.0–0.2)
Basos: 1 %
EOS (ABSOLUTE): 0.2 10*3/uL (ref 0.0–0.4)
EOS: 3 %
HEMATOCRIT: 39.9 % (ref 37.5–51.0)
Hemoglobin: 12.9 g/dL — ABNORMAL LOW (ref 13.0–17.7)
Immature Grans (Abs): 0 10*3/uL (ref 0.0–0.1)
Immature Granulocytes: 0 %
Lymphocytes Absolute: 1.1 10*3/uL (ref 0.7–3.1)
Lymphs: 15 %
MCH: 32.3 pg (ref 26.6–33.0)
MCHC: 32.3 g/dL (ref 31.5–35.7)
MCV: 100 fL — ABNORMAL HIGH (ref 79–97)
Monocytes Absolute: 0.4 10*3/uL (ref 0.1–0.9)
Monocytes: 5 %
Neutrophils Absolute: 5.5 10*3/uL (ref 1.4–7.0)
Neutrophils: 76 %
Platelets: 186 10*3/uL (ref 150–450)
RBC: 4 x10E6/uL — ABNORMAL LOW (ref 4.14–5.80)
RDW: 13 % (ref 11.6–15.4)
WBC: 7.2 10*3/uL (ref 3.4–10.8)

## 2018-10-22 LAB — IRON,TIBC AND FERRITIN PANEL
Ferritin: 349 ng/mL (ref 30–400)
Iron Saturation: 33 % (ref 15–55)
Iron: 88 ug/dL (ref 38–169)
Total Iron Binding Capacity: 268 ug/dL (ref 250–450)
UIBC: 180 ug/dL (ref 111–343)

## 2018-10-22 LAB — PTH, INTACT AND CALCIUM
Calcium: 9.3 mg/dL (ref 8.6–10.2)
PTH: 54 pg/mL (ref 15–65)

## 2018-10-22 LAB — TSH: TSH: 4.29 u[IU]/mL (ref 0.450–4.500)

## 2018-10-22 LAB — GAMMA GT: GGT: 252 IU/L — ABNORMAL HIGH (ref 0–65)

## 2018-10-22 LAB — CK: CK TOTAL: 29 U/L (ref 24–204)

## 2018-10-23 ENCOUNTER — Ambulatory Visit: Payer: Medicare HMO

## 2018-10-26 ENCOUNTER — Other Ambulatory Visit: Payer: Self-pay | Admitting: Family Medicine

## 2018-10-26 NOTE — Telephone Encounter (Signed)
Last WBC reviewed; Rx approved

## 2018-11-20 DIAGNOSIS — Z79899 Other long term (current) drug therapy: Secondary | ICD-10-CM | POA: Diagnosis not present

## 2018-11-20 DIAGNOSIS — M1A00X0 Idiopathic chronic gout, unspecified site, without tophus (tophi): Secondary | ICD-10-CM | POA: Diagnosis not present

## 2018-11-27 ENCOUNTER — Telehealth: Payer: Self-pay | Admitting: Family Medicine

## 2018-11-27 NOTE — Telephone Encounter (Signed)
Please remind patient / wife that he has an outstanding DEXA scan order Give instructions for how they can get this scheduled Thank you

## 2018-11-27 NOTE — Telephone Encounter (Signed)
Wife notified.

## 2018-11-30 ENCOUNTER — Ambulatory Visit: Payer: Medicare HMO | Admitting: Gastroenterology

## 2018-11-30 DIAGNOSIS — R945 Abnormal results of liver function studies: Secondary | ICD-10-CM | POA: Diagnosis not present

## 2018-11-30 DIAGNOSIS — R7989 Other specified abnormal findings of blood chemistry: Secondary | ICD-10-CM

## 2018-11-30 NOTE — Progress Notes (Signed)
Jonathon Bellows MD, MRCP(U.K) [place]  [place], [place] [address]  Main: [phone]  Fax: [phone]   Primary Care Physician: Arnetha Courser, MD  Primary Gastroenterologist:  Dr. Jonathon Bellows   No chief complaint on file.   HPI: Duane [name] is a 68 y.o. male   Summary of history :  He was initially referred and seen back in 06/2017 for abnormal LFT's ongoing for over 3 years .H/o hypothyroidism , CVA, A fibb ,CKD 4 . Noted by Dr Sanda Klein to have an elevated alkaline phosphotase with normal isolated fractions. ANA is positive(1:160) ,No liver disease in the family, does not drink any alcohol., no illegal drug use. No tatoos. Denies any fractures. No over the counter meds, no herbal supplements.  05/2017 : AMA negative, SM ab,LKM ab ,Immunoglobulins,celiac panel,A1AT,ceruloplasmin ,HIV,Hep B eab,Hep B c ab ,Hep A ab ,Hep BsAb/Ag, He C ab,Hep Beag,   Normal/negative, Vitamin D normal, GGT normal  Interval history  10/21/2018 -11/30/2018   10/21/2018 Hb 12.9 , TSH,PTH normal . GGT elevated , normal iron studies   RUQ USG shows hepatic steatosis. Doing well, no new issues   Hepatic Function Latest Ref Rng & Units 09/14/2018 05/22/2018 02/19/2018  Total Protein 6.1 - 8.1 g/dL 6.4 6.0(L) 5.6(L)  Albumin 3.6 - 5.1 g/dL - - -  AST 10 - 35 U/L 72(H) 104(H) 61(H)  ALT 9 - 46 U/L 76(H) 117(H) 59(H)  Alk Phosphatase 40 - 115 U/L - - -  Total Bilirubin 0.2 - 1.2 mg/dL 0.7 0.8 0.7  Bilirubin, Direct 0.0 - 0.2 mg/dL 0.2 - 0.2        Current Outpatient Medications  Medication Sig Dispense Refill  . allopurinol (ZYLOPRIM) 300 MG tablet Take 300 mg by mouth daily.    Marland Kitchen apixaban (ELIQUIS) 5 MG TABS tablet Take 1 tablet (5 mg total) by mouth 2 (two) times daily. 180 tablet 3  . atorvastatin (LIPITOR) 10 MG tablet Take 1 tablet (10 mg total) by mouth at bedtime. (Patient not taking: Reported on 10/21/2018) 30 tablet 1  . calcitRIOL (ROCALTROL) 0.25 MCG capsule  Take 0.25 mcg daily by mouth.     . Cholecalciferol (VITAMIN D3) 2000 units capsule Take 2,000 Units by mouth daily.    . cyanocobalamin 500 MCG tablet Take 500 mcg by mouth daily.    Marland Kitchen doxazosin (CARDURA) 1 MG tablet TAKE 1 TABLET BY MOUTH ONCE DAILY 90 tablet 1  . famotidine (PEPCID) 20 MG tablet TAKE 1 TABLET BY MOUTH TWICE DAILY 180 tablet 3  . ferrous sulfate 325 (65 FE) MG tablet Take 325 mg by mouth 3 (three) times daily with meals.     Marland Kitchen FLUoxetine (PROZAC) 20 MG tablet Take 1 tablet (20 mg total) by mouth daily. (lower dose) 30 tablet 3  . levothyroxine (SYNTHROID, LEVOTHROID) 88 MCG tablet TAKE 1 TABLET BY MOUTH ONCE DAILY 90 tablet 3  . methylphenidate (RITALIN) 5 MG tablet Take 1 tablet (5 mg total) by mouth daily. If needed for attention or focus 90 tablet 0  . mirtazapine (REMERON) 15 MG tablet TAKE 1 TABLET BY MOUTH AT BEDTIME 90 tablet 0   No current facility-administered medications for this visit.     Allergies as of 11/30/2018 - Review Complete 10/21/2018  Allergen Reaction Noted  . Bactrim [sulfamethoxazole-trimethoprim] Other (See Comments) 05/04/2015  . Chlorpromazine Other (See Comments) 09/15/2015  . Cyclobenzaprine Other (See Comments) 01/10/2016  . Oxycodone Other (See Comments) 01/10/2016  ROS:  General: Negative for anorexia, weight loss, fever, chills, fatigue, weakness. ENT: Negative for hoarseness, difficulty swallowing , nasal congestion. CV: Negative for chest pain, angina, palpitations, dyspnea on exertion, peripheral edema.  Respiratory: Negative for dyspnea at rest, dyspnea on exertion, cough, sputum, wheezing.  GI: See history of present illness. GU:  Negative for dysuria, hematuria, urinary incontinence, urinary frequency, nocturnal urination.  Endo: Negative for unusual weight change.    Physical Examination:   There were no vitals taken for this visit.  General: Well-nourished, well-developed in no acute distress.  Eyes: No icterus.  Conjunctivae pink. Mouth: Oropharyngeal mucosa moist and pink , no lesions erythema or exudate. Lungs: Clear to auscultation bilaterally. Non-labored. Heart: Regular rate and rhythm, no murmurs rubs or gallops.  Abdomen: Bowel sounds are normal, nontender, nondistended, no hepatosplenomegaly or masses, no abdominal bruits or hernia , no rebound or guarding.   Extremities: No lower extremity edema. No clubbing or deformities. Neuro: Alert and oriented x 3.  Grossly intact. Skin: Warm and dry, no jaundice.   Psych: Alert and cooperative, normal mood and affect.   Imaging Studies: No results found.  Assessment and Plan:   Duane [name] is a 68 y.o. y/o male 0 here to follow up after over an year for abnormal LFT's . Reviewing labs suggests that he has had abnormal alkaline phosphatase for over 3 years. PTH has been elevated previously but presently normal  .  His transaminases have also been elevated recently and hepatic steatosis seen on USG.  Hepatic steatosis ie fatty liver can cause a similar picture. A MRCP request in the past has been denied by his insurance. I do note his amiodarone has been stopped a few months back and we can monitor his LFT's to see if the LFT's are trending down .   Plan  1. LFT's elevated with GGT suggesting liver etiology- Alk phos and transaminases elevated. Get MRCP if rechecked LFT's today still not trending down ( amiodarone stopped a few months back)  2. Can start on statin for lipids after I recheck LFT's and ensure < 3 x upper limits of normal and I will be happy to monitor it . Suggest starting pravastatin - low dose as large studies have shown that it has no effect on LFT's in comparison to placebo( studied over 112,000 patient years of exposure)  Dr Jonathon Bellows  MD,MRCP [facility]) Follow up in 3 months

## 2018-12-01 LAB — COMPREHENSIVE METABOLIC PANEL
ALT: 45 IU/L — ABNORMAL HIGH (ref 0–44)
AST: 53 IU/L — ABNORMAL HIGH (ref 0–40)
Albumin/Globulin Ratio: 1.4 (ref 1.2–2.2)
Albumin: 3.6 g/dL — ABNORMAL LOW (ref 3.8–4.8)
Alkaline Phosphatase: 141 IU/L — ABNORMAL HIGH (ref 39–117)
BILIRUBIN TOTAL: 0.5 mg/dL (ref 0.0–1.2)
BUN/Creatinine Ratio: 14 (ref 10–24)
BUN: 29 mg/dL — ABNORMAL HIGH (ref 8–27)
CO2: 21 mmol/L (ref 20–29)
Calcium: 9 mg/dL (ref 8.6–10.2)
Chloride: 105 mmol/L (ref 96–106)
Creatinine, Ser: 2.1 mg/dL — ABNORMAL HIGH (ref 0.76–1.27)
GFR calc Af Amer: 37 mL/min/{1.73_m2} — ABNORMAL LOW (ref >59)
GFR calc non Af Amer: 32 mL/min/{1.73_m2} — ABNORMAL LOW (ref >59)
Globulin, Total: 2.6 g/dL (ref 1.5–4.5)
Glucose: 83 mg/dL (ref 65–99)
Potassium: 5 mmol/L (ref 3.5–5.2)
SODIUM: 134 mmol/L (ref 134–144)
Total Protein: 6.2 g/dL (ref 6.0–8.5)

## 2018-12-01 LAB — GAMMA GT: GGT: 168 IU/L — ABNORMAL HIGH (ref 0–65)

## 2018-12-02 ENCOUNTER — Telehealth: Payer: Self-pay | Admitting: Family Medicine

## 2018-12-02 ENCOUNTER — Telehealth: Payer: Self-pay

## 2018-12-02 DIAGNOSIS — R748 Abnormal levels of other serum enzymes: Secondary | ICD-10-CM

## 2018-12-02 DIAGNOSIS — E782 Mixed hyperlipidemia: Secondary | ICD-10-CM

## 2018-12-02 MED ORDER — PRAVASTATIN SODIUM 20 MG PO TABS: 20.0000 mg | ORAL_TABLET | Freq: Every day | ORAL | 1 refills | Status: DC

## 2018-12-02 NOTE — Telephone Encounter (Signed)
Patients wife notified

## 2018-12-02 NOTE — Telephone Encounter (Signed)
Spoke with pt wife, Belenda Cruise, and informed her of pt lab results and Dr. Georgeann Oppenheim instructions for pt to have repeat labs in 3 months.

## 2018-12-02 NOTE — Telephone Encounter (Signed)
Please contact patient / wife STOP atorvastatin -- we won't use that any more START pravastatin 20 mg for cholesterol Check LFTs and lipids in 4-6 weeks (I've already ordered) Thank you

## 2018-12-02 NOTE — Telephone Encounter (Signed)
-----   Message from Jonathon Bellows, MD sent at 12/01/2018  9:25 AM EST ----- Sherald Hess inform patient - LFT's continue to trend down- likely Amiodarone could be the cause which he has stopped . Suggest repeat in 3 months. Dr Sanda Klein can start on Pravastatin if required and if started suggest to recheck LFT's in 4-6 weeks  C/c Lada, Satira Anis, MD   Dr Jonathon Bellows MD,MRCP [facility]) Gastroenterology/Hepatology Pager: [phone]

## 2018-12-07 ENCOUNTER — Other Ambulatory Visit: Payer: Self-pay | Admitting: Family Medicine

## 2018-12-07 NOTE — Telephone Encounter (Signed)
Copied from [place] [id number]. Topic: Quick Communication - Rx Refill/Question >> Dec 07, 2018  9:32 AM Virl Axe D wrote: Medication: methylphenidate (RITALIN) 5 MG tablet / Pt's wife stated they have to call in for refills  Has the patient contacted their pharmacy? Yes.   (Agent: If no, request that the patient contact the pharmacy for the refill.) (Agent: If yes, when and what did the pharmacy advise?)  Preferred Pharmacy (with phone number or street name): [facility] [place] - [address] [phone] (Phone) [phone] (Fax)  Agent: Please be advised that RX refills may take up to 3 business days. We ask that you follow-up with your pharmacy.

## 2018-12-09 NOTE — Telephone Encounter (Signed)
It has been more than three months since last visit so he needs an appointment Scheduled for tomorrow

## 2018-12-10 ENCOUNTER — Ambulatory Visit (INDEPENDENT_AMBULATORY_CARE_PROVIDER_SITE_OTHER): Payer: Medicare HMO | Admitting: Family Medicine

## 2018-12-10 ENCOUNTER — Encounter: Payer: Self-pay | Admitting: Family Medicine

## 2018-12-10 DIAGNOSIS — N2581 Secondary hyperparathyroidism of renal origin: Secondary | ICD-10-CM | POA: Diagnosis not present

## 2018-12-10 DIAGNOSIS — R748 Abnormal levels of other serum enzymes: Secondary | ICD-10-CM | POA: Diagnosis not present

## 2018-12-10 DIAGNOSIS — I77811 Abdominal aortic ectasia: Secondary | ICD-10-CM | POA: Diagnosis not present

## 2018-12-10 DIAGNOSIS — I10 Essential (primary) hypertension: Secondary | ICD-10-CM

## 2018-12-10 DIAGNOSIS — I480 Paroxysmal atrial fibrillation: Secondary | ICD-10-CM

## 2018-12-10 DIAGNOSIS — Z8739 Personal history of other diseases of the musculoskeletal system and connective tissue: Secondary | ICD-10-CM

## 2018-12-10 DIAGNOSIS — E782 Mixed hyperlipidemia: Secondary | ICD-10-CM

## 2018-12-10 DIAGNOSIS — N184 Chronic kidney disease, stage 4 (severe): Secondary | ICD-10-CM | POA: Diagnosis not present

## 2018-12-10 DIAGNOSIS — I69398 Other sequelae of cerebral infarction: Secondary | ICD-10-CM

## 2018-12-10 DIAGNOSIS — F0631 Mood disorder due to known physiological condition with depressive features: Secondary | ICD-10-CM

## 2018-12-10 DIAGNOSIS — R69 Illness, unspecified: Secondary | ICD-10-CM | POA: Diagnosis not present

## 2018-12-10 MED ORDER — METHYLPHENIDATE HCL 5 MG PO TABS: 5.0000 mg | ORAL_TABLET | Freq: Every day | ORAL | 0 refills | Status: DC

## 2018-12-10 NOTE — Patient Instructions (Addendum)
Return on or just after March 23rd for fasting labs to check cholesterol on the new medicine  Try to limit egg yolks to no more than 3 per week  Check out the information at familydoctor.org entitled "Nutrition for Weight Loss: What You Need to Know about Fad Diets" Try to lose between 0.5 to 1 pound per week by taking in fewer calories and burning off more calories You can succeed by limiting portions, limiting foods dense in calories and fat, becoming more active, and drinking 8 glasses of water a day (64 ounces) Don't skip meals, especially breakfast, as skipping meals may alter your metabolism Do not use over-the-counter weight loss pills or gimmicks that claim rapid weight loss A healthy BMI (or body mass index) is between 18.5 and 24.9 You can calculate your ideal BMI at the [place] website [employer]fr

## 2018-12-10 NOTE — Progress Notes (Signed)
BP 112/68   Pulse 92   Temp 98.2 F (36.8 C)   Ht '6\' 4"'  (1.93 m)   Wt 213 lb (96.6 kg)   SpO2 97%   BMI 25.93 kg/m    Subjective:    Patient ID: Duane [name], male    DOB: [birth year], 68 y.o.   MRN: [id number]  HPI: Duane [name] is a 68 y.o. male  Chief Complaint  Patient presents with  . Follow-up    HPI Patient here for follow-up Taking ritalin for depression / anhedonia after his stroke; helpful; no chest pain, no palpitations, no weight loss  Mood has been better says his wife; they went to dinner with friends and last night he was okay, but then got ill before going to bed; "I'm fine now"  He is seeing Dr. Vicente Males for his liver eznymes, elevated alk phos, transaminases, GGT; he said his levels were getting better per the wife  High cholesterol; started the pravastatin on Feb 22nd; limiting fatty meats; he likes cheese but not much; 4-6 eggs per week  CKD stage 4 with secondary hyperparathyroidism, seeing Dr. Holley Raring; every 4 months; they said his kidney function has been improving, up to 34 now (which would push him into stage 3); no NSAIDs; not a good water drinker  Eliquis; no blood in urine or stool; no nosebleeds  Gout, no flares since that hospital stay  Depression screen [facility] 2/9 12/10/2018 08/21/2018 08/21/2018 05/22/2018 03/12/2018  Decreased Interest 0 0 0 0 1  Down, Depressed, Hopeless 0 0 0 0 1  PHQ - 2 Score 0 0 0 0 2  Altered sleeping 0 0 0 0 1  Tired, decreased energy 0 3 0 0 3  Change in appetite 0 0 0 0 0  Feeling bad or failure about yourself  0 0 0 0 1  Trouble concentrating 0 0 0 0 0  Moving slowly or fidgety/restless 0 0 0 0 1  Suicidal thoughts 0 0 0 0 0  PHQ-9 Score 0 3 0 0 8  Difficult doing work/chores Not difficult at all Not difficult at all Not difficult at all - Not difficult at all  MD note: does not appear clinically depressed; stable, baseline  Fall Risk  12/10/2018 08/21/2018 05/22/2018 03/12/2018 02/19/2018  Falls in the  past year? 0 0 No No No  Number falls in past yr: - 0 - - -  Risk for fall due to : - - - Medication side effect -    Relevant past medical, surgical, family and social history reviewed Past Medical History:  Diagnosis Date  . A-fib [facility])    requiring life-long anticouagulation  . Aortic ectasia, abdominal ([place]) 05/22/2018   [place] Sept 2018  . Cerebellar infarct ([place])   . CKD (chronic kidney disease)    creatinine baseline 1.3-1.5 in 2015  . GERD (gastroesophageal reflux disease)   . Gout   . Graves disease   . H/O hyperkalemia    with Bactrim  . Hyperlipidemia    requiring life long statin therapy  . Hyperparathyroidism, secondary renal ([place]) 10/02/2017  . Hypertension   . Hypothyroidism   . Left hemiparesis [facility]) May 2015   s/p strke  . Poor short term memory   . Renal insufficiency   . Restrictive lung disease    mild  . Stroke ([place]) 02/20/14   left side hemiparesis, dysphagia  . UTI (urinary tract infection) due to Enterococcus May 2015  . Vitamin D deficiency disease  Past Surgical History:  Procedure Laterality Date  . CATARACT EXTRACTION W/PHACO Right 08/27/2016   Procedure: CATARACT EXTRACTION PHACO AND INTRAOCULAR LENS PLACEMENT (IOC);  Surgeon: Eulogio Bear, MD;  Location: [place];  Service: Ophthalmology;  Laterality: Right;  RIGHT  . ENTEROSTOMY CLOSURE  11/27/15   ileostomy takedown  . EXPLORATORY LAPAROTOMY  11/27/15  . illeostomy  07/2014  . POLYPECTOMY  10/15   GI surgery to remove polyp   Family History  Problem Relation Age of Onset  . Arthritis Mother   . Heart disease Mother   . Heart attack Father   . Heart disease Father   . Cirrhosis Sister   . Hyperparathyroidism Neg Hx    Social History   Tobacco Use  . Smoking status: Never Smoker  . Smokeless tobacco: Never Used  . Tobacco comment: smoking cessation materials not required  Substance Use Topics  . Alcohol use: No  . Drug use: No     Office Visit from 12/10/2018 in  [facility]  AUDIT-C Score  0      Interim medical history since last visit reviewed. Allergies and medications reviewed  Review of Systems Per HPI unless specifically indicated above     Objective:    BP 112/68   Pulse 92   Temp 98.2 F (36.8 C)   Ht '6\' 4"'  (1.93 m)   Wt 213 lb (96.6 kg)   SpO2 97%   BMI 25.93 kg/m   Wt Readings from Last 3 Encounters:  12/10/18 213 lb (96.6 kg)  10/21/18 206 lb 6.4 oz (93.6 kg)  08/21/18 201 lb 1.6 oz (91.2 kg)   MD note: he was weighed with key, shoes, phone, coat  Physical Exam Constitutional:      General: He is not in acute distress.    Appearance: He is well-developed.  HENT:     Head: Normocephalic and atraumatic.  Eyes:     General: No scleral icterus. Neck:     Thyroid: No thyromegaly.  Cardiovascular:     Rate and Rhythm: Normal rate and regular rhythm.  Pulmonary:     Effort: Pulmonary effort is normal.     Breath sounds: Normal breath sounds.  Abdominal:     General: Bowel sounds are normal. There is no distension.     Palpations: Abdomen is soft.  Skin:    General: Skin is warm and dry.     Coloration: Skin is not pale.  Neurological:     Coordination: Coordination normal.  Psychiatric:        Mood and Affect: Mood is not anxious or depressed. Affect is blunt and flat.        Speech: Speech is not delayed or slurred.        Behavior: Behavior is withdrawn. Behavior is cooperative.     Comments: Baseline for patient, limited eye contact with examiner; not much spontaneous conversation but does answers questions appropriately when asked; wife assists with history     Results for orders placed or performed in visit on 11/30/18  Comprehensive Metabolic Panel (CMET)  Result Value Ref Range   Glucose 83 65 - 99 mg/dL   BUN 29 (H) 8 - 27 mg/dL   Creatinine, Ser 2.10 (H) 0.76 - 1.27 mg/dL   GFR calc non Af Amer 32 (L) >59 mL/min/1.73   GFR calc Af Amer 37 (L) >59 mL/min/1.73   BUN/Creatinine  Ratio 14 10 - 24   Sodium 134 134 - 144 mmol/L  Potassium 5.0 3.5 - 5.2 mmol/L   Chloride 105 96 - 106 mmol/L   CO2 21 20 - 29 mmol/L   Calcium 9.0 8.6 - 10.2 mg/dL   Total Protein 6.2 6.0 - 8.5 g/dL   Albumin 3.6 (L) 3.8 - 4.8 g/dL   Globulin, Total 2.6 1.5 - 4.5 g/dL   Albumin/Globulin Ratio 1.4 1.2 - 2.2   Bilirubin Total 0.5 0.0 - 1.2 mg/dL   Alkaline Phosphatase 141 (H) 39 - 117 IU/L   AST 53 (H) 0 - 40 IU/L   ALT 45 (H) 0 - 44 IU/L  Gamma GT  Result Value Ref Range   GGT 168 (H) 0 - 65 IU/L      Assessment & Plan:   Problem List Items Addressed This Visit      Cardiovascular and Mediastinum   A-fib (HCC)   Hypertension (Chronic)    Well-controlled      Aortic ectasia, abdominal (HCC)    Need to keep LDL under 70; GI suggested pravastatin and close f/u with labs        Endocrine   Hyperparathyroidism, secondary renal ([place])    Managed by nephrologist        Genitourinary   Chronic kidney disease, stage IV (severe) (HCC) (Chronic)     Other   Hyperlipidemia    Life-long statin therapy recommended previously in 2016; now on pravastatin (chosen by GI because of his liver disease); will recheck lipids and liver enzymes 4 weeks after initiation of the statin      History of gout    Thankfully, no flares since his prior hospitalization      Depression due to old stroke (Chronic)    Continue low dose ritalin which is working well, no side effects; NOT using stimulant for weight control      Alkaline phosphatase elevation    Monitored by GI          Follow up plan: Return in about 3 months (around 03/10/2019) for follow-up visit with Dr. Sanda Klein or just BEFORE.  An after-visit summary was printed and given to the patient at [facility].  Please see the patient instructions which may contain other information and recommendations beyond what is mentioned above in the assessment and plan.  Meds ordered this encounter  Medications  . methylphenidate (RITALIN)  5 MG tablet    Sig: Take 1 tablet (5 mg total) by mouth daily. If needed for attention or focus    Dispense:  90 tablet    Refill:  0    No orders of the defined types were placed in this encounter.

## 2018-12-11 NOTE — Assessment & Plan Note (Signed)
Need to keep LDL under 70; GI suggested pravastatin and close f/u with labs

## 2018-12-11 NOTE — Assessment & Plan Note (Signed)
Managed by nephrologist 

## 2018-12-11 NOTE — Assessment & Plan Note (Signed)
Life-long statin therapy recommended previously in 2016; now on pravastatin (chosen by GI because of his liver disease); will recheck lipids and liver enzymes 4 weeks after initiation of the statin

## 2018-12-11 NOTE — Assessment & Plan Note (Signed)
Monitored by GI

## 2018-12-11 NOTE — Assessment & Plan Note (Signed)
Thankfully, no flares since his prior hospitalization

## 2018-12-11 NOTE — Assessment & Plan Note (Signed)
Continue low dose ritalin which is working well, no side effects; NOT using stimulant for weight control

## 2018-12-11 NOTE — Assessment & Plan Note (Signed)
Well controlled 

## 2018-12-25 ENCOUNTER — Other Ambulatory Visit: Payer: Self-pay

## 2018-12-25 MED ORDER — FLUOXETINE HCL 20 MG PO TABS: 20.0000 mg | ORAL_TABLET | Freq: Every day | ORAL | 2 refills | Status: DC

## 2019-01-04 ENCOUNTER — Other Ambulatory Visit: Payer: Self-pay | Admitting: Family Medicine

## 2019-01-04 DIAGNOSIS — R945 Abnormal results of liver function studies: Secondary | ICD-10-CM

## 2019-01-04 DIAGNOSIS — R7989 Other specified abnormal findings of blood chemistry: Secondary | ICD-10-CM

## 2019-01-13 ENCOUNTER — Other Ambulatory Visit: Payer: Self-pay | Admitting: Family Medicine

## 2019-01-13 ENCOUNTER — Other Ambulatory Visit: Payer: Self-pay

## 2019-01-13 DIAGNOSIS — E782 Mixed hyperlipidemia: Secondary | ICD-10-CM

## 2019-01-13 DIAGNOSIS — R748 Abnormal levels of other serum enzymes: Secondary | ICD-10-CM

## 2019-01-13 DIAGNOSIS — Z5181 Encounter for therapeutic drug level monitoring: Secondary | ICD-10-CM

## 2019-01-13 LAB — HEPATIC FUNCTION PANEL
AG Ratio: 1.3 (calc) (ref 1.0–2.5)
ALT: 33 U/L (ref 9–46)
AST: 47 U/L — ABNORMAL HIGH (ref 10–35)
Albumin: 3.6 g/dL (ref 3.6–5.1)
Alkaline phosphatase (APISO): 106 U/L (ref 35–144)
Bilirubin, Direct: 0.1 mg/dL (ref 0.0–0.2)
Globulin: 2.8 g/dL (calc) (ref 1.9–3.7)
Indirect Bilirubin: 0.4 mg/dL (calc) (ref 0.2–1.2)
Total Bilirubin: 0.5 mg/dL (ref 0.2–1.2)
Total Protein: 6.4 g/dL (ref 6.1–8.1)

## 2019-01-13 LAB — LIPID PANEL
Cholesterol: 217 mg/dL — ABNORMAL HIGH (ref <200)
HDL: 34 mg/dL — ABNORMAL LOW (ref >=40)
LDL Cholesterol (Calc): 152 mg/dL (calc) — ABNORMAL HIGH
Non-HDL Cholesterol (Calc): 183 mg/dL (calc) — ABNORMAL HIGH (ref <130)
Total CHOL/HDL Ratio: 6.4 (calc) — ABNORMAL HIGH (ref <5.0)
Triglycerides: 179 mg/dL — ABNORMAL HIGH (ref <150)

## 2019-01-13 MED ORDER — PRAVASTATIN SODIUM 40 MG PO TABS: 40.0000 mg | ORAL_TABLET | Freq: Every day | ORAL | 0 refills | Status: DC

## 2019-01-13 NOTE — Progress Notes (Signed)
Duane [name], please let the patient know that his liver enzymes are a little better However, his cholesterol is worse; instruct him to increase his pravastatin from 20 mg to 40 mg daily; best if taken in the evening Really watch diet; avoid saturated fats as much as possible and try to limit or avoid red meat entirely Recheck liver enzymes and cholesterol panel in 4 weeks (I'll order); thank you

## 2019-01-28 ENCOUNTER — Other Ambulatory Visit: Payer: Self-pay | Admitting: Family Medicine

## 2019-02-01 DIAGNOSIS — I480 Paroxysmal atrial fibrillation: Secondary | ICD-10-CM | POA: Diagnosis not present

## 2019-02-01 DIAGNOSIS — I77811 Abdominal aortic ectasia: Secondary | ICD-10-CM | POA: Diagnosis not present

## 2019-02-01 DIAGNOSIS — N183 Chronic kidney disease, stage 3 (moderate): Secondary | ICD-10-CM | POA: Diagnosis not present

## 2019-02-05 DIAGNOSIS — D631 Anemia in chronic kidney disease: Secondary | ICD-10-CM | POA: Diagnosis not present

## 2019-02-05 DIAGNOSIS — N183 Chronic kidney disease, stage 3 (moderate): Secondary | ICD-10-CM | POA: Diagnosis not present

## 2019-02-05 DIAGNOSIS — N2581 Secondary hyperparathyroidism of renal origin: Secondary | ICD-10-CM | POA: Diagnosis not present

## 2019-02-05 DIAGNOSIS — M109 Gout, unspecified: Secondary | ICD-10-CM | POA: Diagnosis not present

## 2019-02-05 DIAGNOSIS — I10 Essential (primary) hypertension: Secondary | ICD-10-CM | POA: Diagnosis not present

## 2019-02-08 ENCOUNTER — Ambulatory Visit: Payer: Medicare HMO | Admitting: Gastroenterology

## 2019-03-01 ENCOUNTER — Ambulatory Visit: Payer: Medicare HMO | Admitting: Gastroenterology

## 2019-03-01 ENCOUNTER — Telehealth: Payer: Self-pay

## 2019-03-01 ENCOUNTER — Ambulatory Visit (INDEPENDENT_AMBULATORY_CARE_PROVIDER_SITE_OTHER): Payer: Medicare HMO | Admitting: Gastroenterology

## 2019-03-01 DIAGNOSIS — R945 Abnormal results of liver function studies: Secondary | ICD-10-CM

## 2019-03-01 DIAGNOSIS — R7989 Other specified abnormal findings of blood chemistry: Secondary | ICD-10-CM

## 2019-03-01 NOTE — Progress Notes (Signed)
Jonathon Bellows , MD [address]  [place]  [place], [place] [address]  Main: [phone]  Fax: [phone]   Primary Care Physician: Arnetha Courser, MD  Virtual Visit via Telephone Note  I connected with patient on 03/01/19 at 10:00 AM EDT by telephone and verified that I am speaking with the correct person using two identifiers.   I discussed the limitations, risks, security and privacy concerns of performing an evaluation and management service by telephone and the availability of in person appointments. I also discussed with the patient that there may be a patient responsible charge related to this service. The patient expressed understanding and agreed to proceed.  Location of Patient: Home Location of Provider: Home Persons involved: Patient and provider only   History of Present Illness: No chief complaint on file.   HPI: Duane [name] is a 68 y.o. male   Summary of history :  He was initially referred and seen back in 06/2017 for abnormal LFT's ongoing for over 3 years .H/o hypothyroidism , CVA, A fibb ,CKD 4 . Noted by Dr Sanda Klein to have an elevated alkaline phosphotase with normal isolated fractions. ANA is positive(1:160) ,No liver disease in the family, does not drink any alcohol., no illegal drug use. No tatoos. Denies any fractures. No over the counter meds, no herbal supplements.  05/2017 : AMA negative, SM ab,LKM ab ,Immunoglobulins,celiac panel,A1AT,ceruloplasmin ,HIV,Hep B eab,Hep B c ab ,Hep A ab ,Hep BsAb/Ag, He C ab,Hep Beag,   Normal/negative, Vitamin D normal, GGT normal  PTH elevated  RUQ USG shows hepatic steatosis. Doing well, no new issues  Interval history 10/21/2018-03/01/2019 No new concerns, discussed improved LFT's    Hepatic Function Latest Ref Rng & Units 01/13/2019 11/30/2018 09/14/2018  Total Protein 6.1 - 8.1 g/dL 6.4 6.2 6.4  Albumin 3.8 - 4.8 g/dL - 3.6(L) -  AST 10 - 35 U/L 47(H) 53(H) 72(H)  ALT 9 - 46 U/L 33 45(H)  76(H)  Alk Phosphatase 39 - 117 IU/L - 141(H) -  Total Bilirubin 0.2 - 1.2 mg/dL 0.5 0.5 0.7  Bilirubin, Direct 0.0 - 0.2 mg/dL 0.1 - 0.2       Current Outpatient Medications  Medication Sig Dispense Refill  . allopurinol (ZYLOPRIM) 300 MG tablet Take 300 mg by mouth daily.    Marland Kitchen apixaban (ELIQUIS) 5 MG TABS tablet Take 1 tablet (5 mg total) by mouth 2 (two) times daily. 180 tablet 3  . calcitRIOL (ROCALTROL) 0.25 MCG capsule Take 0.25 mcg daily by mouth.     . Cholecalciferol (VITAMIN D3) 2000 units capsule Take 2,000 Units by mouth daily.    . cyanocobalamin 500 MCG tablet Take 500 mcg by mouth daily.    Marland Kitchen doxazosin (CARDURA) 1 MG tablet Take 1 tablet by mouth once daily 90 tablet 0  . famotidine (PEPCID) 20 MG tablet TAKE 1 TABLET BY MOUTH TWICE DAILY 180 tablet 3  . ferrous sulfate 325 (65 FE) MG tablet Take 325 mg by mouth 3 (three) times daily with meals.     Marland Kitchen FLUoxetine (PROZAC) 20 MG tablet Take 1 tablet (20 mg total) by mouth daily. (lower dose) 30 tablet 2  . levothyroxine (SYNTHROID, LEVOTHROID) 88 MCG tablet TAKE 1 TABLET BY MOUTH ONCE DAILY 90 tablet 3  . losartan (COZAAR) 25 MG tablet Take 25 mg by mouth daily.    . methylphenidate (RITALIN) 5 MG tablet Take 1 tablet (5 mg total) by mouth daily. If needed for attention or  focus 90 tablet 0  . mirtazapine (REMERON) 15 MG tablet TAKE 1 TABLET BY MOUTH AT BEDTIME 90 tablet 0  . pravastatin (PRAVACHOL) 40 MG tablet Take 1 tablet (40 mg total) by mouth daily. 90 tablet 0   No current facility-administered medications for this visit.     Allergies as of 03/01/2019 - Review Complete 12/10/2018  Allergen Reaction Noted  . Bactrim [sulfamethoxazole-trimethoprim] Other (See Comments) 05/04/2015  . Chlorpromazine Other (See Comments) 09/15/2015  . Cyclobenzaprine Other (See Comments) 01/10/2016  . Oxycodone Other (See Comments) 01/10/2016    Review of Systems:    All systems reviewed and negative except where noted in HPI.    Observations/Objective:  Labs: CMP     Component Value Date/Time   NA 134 11/30/2018 1433   NA 139 08/11/2014 0621   K 5.0 11/30/2018 1433   K 2.7 (L) 08/11/2014 0621   CL 105 11/30/2018 1433   CL 103 08/11/2014 0621   CO2 21 11/30/2018 1433   CO2 27 08/11/2014 0621   GLUCOSE 83 11/30/2018 1433   GLUCOSE 86 05/22/2018 1148   GLUCOSE 98 08/11/2014 0621   BUN 29 (H) 11/30/2018 1433   BUN 10 08/11/2014 0621   CREATININE 2.10 (H) 11/30/2018 1433   CREATININE 2.39 (H) 05/22/2018 1148   CALCIUM 9.0 11/30/2018 1433   CALCIUM 7.1 (L) 08/11/2014 0621   PROT 6.4 01/13/2019 1123   PROT 6.2 11/30/2018 1433   PROT 4.9 (L) 08/03/2014 2056   ALBUMIN 3.6 (L) 11/30/2018 1433   ALBUMIN 1.5 (L) 08/03/2014 2056   AST 47 (H) 01/13/2019 1123   AST 30 08/03/2014 2056   ALT 33 01/13/2019 1123   ALT 10 (L) 08/03/2014 2056   ALKPHOS 141 (H) 11/30/2018 1433   ALKPHOS 112 08/03/2014 2056   BILITOT 0.5 01/13/2019 1123   BILITOT 0.5 11/30/2018 1433   BILITOT 1.0 08/03/2014 2056   GFRNONAA 32 (L) 11/30/2018 1433   GFRNONAA 27 (L) 05/22/2018 1148   GFRAA 37 (L) 11/30/2018 1433   GFRAA 32 (L) 05/22/2018 1148   Lab Results  Component Value Date   WBC 7.2 10/21/2018   HGB 12.9 (L) 10/21/2018   HCT 39.9 10/21/2018   MCV 100 (H) 10/21/2018   PLT 186 10/21/2018    Imaging Studies: No results found.  Assessment and Plan:   Duane [name] is a 68 y.o. y/o male here to follow up after over an year for abnormal LFT's . Reviewing labs suggests that he has had abnormal alkaline phosphatase for over 3 years. PTH has been elevated . Seen by endocrinology and felt secondary to CKD.  hepatic steatosis seen on USG, viral hepatitis, genetic disorders have been ruled out. Hepatic steatosis ie fatty liver can cause a similar picture. A MRCP request in the past has been denied by his insurance. Amiodarone has been stopped and since the LFT's have almost normalized . He is on pravastatin  Plan  1.  F/u with Dr Sanda Klein. No further GI work up. Likely elevated LFT's were due to amiodarone which have normalized since being stopped/     I discussed the assessment and treatment plan with the patient. The patient was provided an opportunity to ask questions and all were answered. The patient agreed with the plan and demonstrated an understanding of the instructions.   The patient was advised to call back or seek an in-person evaluation if the symptoms worsen or if the condition fails to improve as anticipated.    Dr Bailey Mech  Vicente Males MD,MRCP [facility]) Gastroenterology/Hepatology Pager: [phone]   Speech recognition software was used to dictate this note.

## 2019-03-01 NOTE — Telephone Encounter (Signed)
Called pt to pre-chart for today's e-visit with Dr. Anna  Unable to contact. LVM to return call 

## 2019-03-05 ENCOUNTER — Telehealth: Payer: Self-pay | Admitting: Family Medicine

## 2019-03-05 NOTE — Telephone Encounter (Signed)
Copied from [place] [id number]. Topic: Quick Communication - Rx Refill/Question >> Mar 05, 2019  2:16 PM Robina Ade, Helene Kelp D wrote: Medication: methylphenidate (RITALIN) 5 MG tablet  Has the patient contacted their pharmacy? Yes (Agent: If no, request that the patient contact the pharmacy for the refill.) (Agent: If yes, when and what did the pharmacy advise?)  Preferred Pharmacy (with phone number or street name):[place], [place]  Agent: Please be advised that RX refills may take up to 3 business days. We ask that you follow-up with your pharmacy.

## 2019-03-07 NOTE — Telephone Encounter (Signed)
We cannot refill ritalin unless he comes in for follow up.  Please schedule patient for follow up in the next 14 days.

## 2019-03-09 NOTE — Telephone Encounter (Signed)
Pt already have appt scheduled for 5.28.2020

## 2019-03-11 ENCOUNTER — Ambulatory Visit: Payer: Medicare HMO | Admitting: Family Medicine

## 2019-03-12 ENCOUNTER — Ambulatory Visit (INDEPENDENT_AMBULATORY_CARE_PROVIDER_SITE_OTHER): Payer: Medicare HMO | Admitting: Nurse Practitioner

## 2019-03-12 ENCOUNTER — Other Ambulatory Visit: Payer: Self-pay

## 2019-03-12 ENCOUNTER — Encounter: Payer: Self-pay | Admitting: Nurse Practitioner

## 2019-03-12 VITALS — BP 108/70 | HR 88 | Temp 97.9°F | Resp 12 | Ht 76.0 in | Wt 209.3 lb

## 2019-03-12 DIAGNOSIS — I480 Paroxysmal atrial fibrillation: Secondary | ICD-10-CM | POA: Diagnosis not present

## 2019-03-12 DIAGNOSIS — R69 Illness, unspecified: Secondary | ICD-10-CM | POA: Diagnosis not present

## 2019-03-12 DIAGNOSIS — E890 Postprocedural hypothyroidism: Secondary | ICD-10-CM | POA: Diagnosis not present

## 2019-03-12 DIAGNOSIS — R7989 Other specified abnormal findings of blood chemistry: Secondary | ICD-10-CM

## 2019-03-12 DIAGNOSIS — Z5181 Encounter for therapeutic drug level monitoring: Secondary | ICD-10-CM | POA: Diagnosis not present

## 2019-03-12 DIAGNOSIS — I69398 Other sequelae of cerebral infarction: Secondary | ICD-10-CM | POA: Diagnosis not present

## 2019-03-12 DIAGNOSIS — M1A30X0 Chronic gout due to renal impairment, unspecified site, without tophus (tophi): Secondary | ICD-10-CM | POA: Diagnosis not present

## 2019-03-12 DIAGNOSIS — N184 Chronic kidney disease, stage 4 (severe): Secondary | ICD-10-CM

## 2019-03-12 DIAGNOSIS — G4700 Insomnia, unspecified: Secondary | ICD-10-CM

## 2019-03-12 DIAGNOSIS — D692 Other nonthrombocytopenic purpura: Secondary | ICD-10-CM

## 2019-03-12 DIAGNOSIS — F988 Other specified behavioral and emotional disorders with onset usually occurring in childhood and adolescence: Secondary | ICD-10-CM

## 2019-03-12 DIAGNOSIS — E782 Mixed hyperlipidemia: Secondary | ICD-10-CM | POA: Diagnosis not present

## 2019-03-12 DIAGNOSIS — R945 Abnormal results of liver function studies: Secondary | ICD-10-CM

## 2019-03-12 DIAGNOSIS — F0631 Mood disorder due to known physiological condition with depressive features: Secondary | ICD-10-CM

## 2019-03-12 DIAGNOSIS — I10 Essential (primary) hypertension: Secondary | ICD-10-CM | POA: Diagnosis not present

## 2019-03-12 MED ORDER — METHYLPHENIDATE HCL 5 MG PO TABS: 5.0000 mg | ORAL_TABLET | Freq: Every day | ORAL | 0 refills | Status: DC

## 2019-03-12 MED ORDER — LEVOTHYROXINE SODIUM 88 MCG PO TABS: 88.0000 ug | ORAL_TABLET | Freq: Every day | ORAL | 1 refills | Status: DC

## 2019-03-12 MED ORDER — FLUOXETINE HCL 20 MG PO TABS: 20.0000 mg | ORAL_TABLET | Freq: Every day | ORAL | 1 refills | Status: DC

## 2019-03-12 MED ORDER — MIRTAZAPINE 15 MG PO TABS: 15.0000 mg | ORAL_TABLET | Freq: Every day | ORAL | 1 refills | Status: DC

## 2019-03-12 MED ORDER — PRAVASTATIN SODIUM 40 MG PO TABS: 40.0000 mg | ORAL_TABLET | Freq: Every day | ORAL | 1 refills | Status: DC

## 2019-03-12 NOTE — Progress Notes (Signed)
Name: Duane [name]   MRN: [id number]    DOB: 1950/12/25   Date:03/12/2019       Progress Note  Subjective  Chief Complaint  Chief Complaint  Patient presents with  . Follow-up    HPI  Hypertension Patient is on losartan 25mg . Saw nephrologist last month and BP was 528'U systolic.  Takes medications as prescribed with no missed doses a month.  Relatively compliant with low-salt diet.  Denies chest pain, headaches, blurry vision, denies orthostasis.  BP Readings from Last 3 Encounters:  03/12/19 108/70  12/10/18 112/68  10/21/18 125/68   Atrial Fibrillation Sees Dr. Ubaldo Glassing- cardiology. Is on eliquis 5mg  BID. Patient was taken off of metoprolol during last visit for bradycardia and his amiodarone due to liver dysfunction. His liver dysfunction has improved. At his last cardiologist appointment on 02/01/2019 his EKG today shows sinus rhythm at a rate of 64.  Hyperlipidemia Patient rx pravastatin Takes medications as prescribed with no missed doses a month.  LDL goal is under 70, however patient has elevated liver enzymes- recommended pravastatin by GI.  Has cereal in the morning, eggs twice weekly, home cooked lunchs.   Lab Results  Component Value Date   CHOL 217 (H) 01/13/2019   HDL 34 (L) 01/13/2019   LDLCALC 152 (H) 01/13/2019   TRIG 179 (H) 01/13/2019   CHOLHDL 6.4 (H) 01/13/2019    Hypothyroidism Patient rx levothryoxine 88 mcg daily. Patient had graves disease and was treated with radioactive iodine over ten years ago resulting in hypothyroidism.  Patient denies fatigue/palpitations, insomnia, constipation/diarrhea, hot/cold intolerances,   Lab Results  Component Value Date   TSH 4.290 10/21/2018   Gout Sees Dr. Jefm Bryant- Rheumatology. Taking allopurinol 300mg  daily without missed doses. He has not had a gout flare since he has been on this back in 2015. Was having frequent gout flares in elbows, hands and knees.    Insomnia Has struggled with this after  stroke and was started on remeron- has been sleeping well ever since he has been on this medicine. Usually gets 9-10 hours of sleep at night.   ADD Takes ritalin daily to help with focus and complete task. This medicine was started after his stroke when he was having increased difficulty completing tasks.   Depression Started after he had stroke resulting in left sided hemiparesis. He is taking fluoxetine 20mg  daily without issue.   PHQ2/9: Depression screen [facility] 2/9 03/12/2019 12/10/2018 08/21/2018 08/21/2018 05/22/2018  Decreased Interest 0 0 0 0 0  Down, Depressed, Hopeless 0 0 0 0 0  PHQ - 2 Score 0 0 0 0 0  Altered sleeping 0 0 0 0 0  Tired, decreased energy 0 0 3 0 0  Change in appetite 0 0 0 0 0  Feeling bad or failure about yourself  0 0 0 0 0  Trouble concentrating 0 0 0 0 0  Moving slowly or fidgety/restless 0 0 0 0 0  Suicidal thoughts 0 0 0 0 0  PHQ-9 Score 0 0 3 0 0  Difficult doing work/chores Not difficult at all Not difficult at all Not difficult at all Not difficult at all -     PHQ reviewed. Negative  Patient Active Problem List   Diagnosis Date Noted  . Aortic ectasia, abdominal ([place]) 05/22/2018  . Hyperparathyroidism, secondary renal ([place]) 10/02/2017  . Bursitis of shoulder 06/23/2017  . Controlled substance agreement signed 05/23/2017  . On stimulant medication 02/22/2017  . Chronic low back pain without sciatica  08/21/2016  . Low libido 05/23/2016  . Colon polyp 11/27/2015  . Vitamin D deficiency 09/04/2015  . 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 Anemia, chronic renal failure 06/09/2015  . Chronic kidney disease, stage IV (severe) ([place])   . Hypertension   . Chronic gout due to renal impairment   . Hyperlipidemia   . Hypothyroidism following radioiodine therapy   . Graves disease   . Restrictive lung disease   . A-fib ([place])   . History of gout 02/28/2014  . Dysphagia following cerebral infarction 02/21/2014  . History of arterial ischemic stroke  02/21/2014  . Left hemiparesis ([place]) 02/11/2014    Past Medical History:  Diagnosis Date  . A-fib [facility])    requiring life-long anticouagulation  . Aortic ectasia, abdominal ([place]) 05/22/2018   [place] Sept 2018  . Cerebellar infarct ([place])   . CKD (chronic kidney disease)    creatinine baseline 1.3-1.5 in 2015  . GERD (gastroesophageal reflux disease)   . Gout   . Graves disease   . H/O hyperkalemia    with Bactrim  . Hyperlipidemia    requiring life long statin therapy  . Hyperparathyroidism, secondary renal ([place]) 10/02/2017  . Hypertension   . Hypothyroidism   . Left hemiparesis [facility]) May 2015   s/p strke  . Poor short term memory   . Renal insufficiency   . Restrictive lung disease    mild  . Stroke ([place]) 02/20/14   left side hemiparesis, dysphagia  . UTI (urinary tract infection) due to Enterococcus May 2015  . Vitamin D deficiency disease     Past Surgical History:  Procedure Laterality Date  . CATARACT EXTRACTION W/PHACO Right 08/27/2016   Procedure: CATARACT EXTRACTION PHACO AND INTRAOCULAR LENS PLACEMENT (IOC);  Surgeon: Eulogio Bear, MD;  Location: [facility];  Service: Ophthalmology;  Laterality: Right;  RIGHT  . ENTEROSTOMY CLOSURE  11/27/15   ileostomy takedown  . EXPLORATORY LAPAROTOMY  11/27/15  . illeostomy  07/2014  . POLYPECTOMY  10/15   GI surgery to remove polyp    Social History   Tobacco Use  . Smoking status: Never Smoker  . Smokeless tobacco: Never Used  . Tobacco comment: smoking cessation materials not required  Substance Use Topics  . Alcohol use: No     Current Outpatient Medications:  .  allopurinol (ZYLOPRIM) 300 MG tablet, Take 300 mg by mouth daily., Disp: , Rfl:  .  apixaban (ELIQUIS) 5 MG TABS tablet, Take 1 tablet (5 mg total) by mouth 2 (two) times daily., Disp: 180 tablet, Rfl: 3 .  calcitRIOL (ROCALTROL) 0.25 MCG capsule, Take 0.25 mcg daily by mouth. , Disp: , Rfl:  .  Cholecalciferol (VITAMIN D3) 2000 units  capsule, Take 2,000 Units by mouth daily., Disp: , Rfl:  .  cyanocobalamin 500 MCG tablet, Take 500 mcg by mouth daily., Disp: , Rfl:  .  doxazosin (CARDURA) 1 MG tablet, Take 1 tablet by mouth once daily, Disp: 90 tablet, Rfl: 0 .  famotidine (PEPCID) 20 MG tablet, TAKE 1 TABLET BY MOUTH TWICE DAILY, Disp: 180 tablet, Rfl: 3 .  ferrous sulfate 325 (65 FE) MG tablet, Take 325 mg by mouth 3 (three) times daily with meals. , Disp: , Rfl:  .  FLUoxetine (PROZAC) 20 MG tablet, Take 1 tablet (20 mg total) by mouth daily. (lower dose), Disp: 30 tablet, Rfl: 2 .  levothyroxine (SYNTHROID, LEVOTHROID) 88 MCG tablet, TAKE 1 TABLET BY MOUTH ONCE DAILY, Disp: 90 tablet, Rfl:  3 .  losartan (COZAAR) 25 MG tablet, Take 25 mg by mouth daily., Disp: , Rfl:  .  methylphenidate (RITALIN) 5 MG tablet, Take 1 tablet (5 mg total) by mouth daily. If needed for attention or focus, Disp: 90 tablet, Rfl: 0 .  mirtazapine (REMERON) 15 MG tablet, TAKE 1 TABLET BY MOUTH AT BEDTIME, Disp: 90 tablet, Rfl: 0 .  pravastatin (PRAVACHOL) 40 MG tablet, Take 1 tablet (40 mg total) by mouth daily., Disp: 90 tablet, Rfl: 0  Allergies  Allergen Reactions  . Bactrim [Sulfamethoxazole-Trimethoprim] Other (See Comments)    Hyperkalemia  . Chlorpromazine Other (See Comments)    Hiccups, hallucinations  . Cyclobenzaprine Other (See Comments)    Sleeps for 8 hrs after taking it  . Oxycodone Other (See Comments)    Excessive sleepiness    Review of Systems  Constitutional: Negative for chills, fever and malaise/fatigue.  HENT: Negative for sinus pain, sore throat and tinnitus.   Respiratory: Negative for cough and shortness of breath.   Cardiovascular: Negative for chest pain, palpitations and leg swelling.  Gastrointestinal: Negative for abdominal pain, blood in stool and diarrhea.  Musculoskeletal: Negative for joint pain and myalgias.  Skin: Negative for rash.  Neurological: Negative for dizziness and headaches.   Psychiatric/Behavioral: The patient is not nervous/anxious and does not have insomnia.       No other specific complaints in a complete review of systems (except as listed in HPI above).  Objective  Vitals:   03/12/19 0952  BP: 108/70  Pulse: 88  Resp: 12  Temp: 97.9 F (36.6 C)  TempSrc: Oral  SpO2: 99%  Weight: 209 lb 4.8 oz (94.9 kg)  Height: 6\' 4"  (1.93 m)     Body mass index is 25.48 kg/m.  Nursing Note and Vital Signs reviewed.  Physical Exam Vitals signs reviewed.  Constitutional:      Appearance: He is well-developed.  HENT:     Head: Normocephalic and atraumatic.  Neck:     Musculoskeletal: Normal range of motion and neck supple.     Vascular: No carotid bruit.  Cardiovascular:     Heart sounds: Normal heart sounds.  Pulmonary:     Effort: Pulmonary effort is normal.     Breath sounds: Normal breath sounds.  Abdominal:     General: Bowel sounds are normal.     Palpations: Abdomen is soft.     Tenderness: There is no abdominal tenderness.  Musculoskeletal: Normal range of motion.  Skin:    General: Skin is warm and dry.     Capillary Refill: Capillary refill takes less than 2 seconds.  Neurological:     Mental Status: He is alert and oriented to person, place, and time.     GCS: GCS eye subscore is 4. GCS verbal subscore is 5. GCS motor subscore is 6.     Sensory: No sensory deficit.  Psychiatric:        Speech: Speech normal.        Behavior: Behavior normal.        Thought Content: Thought content normal.        Judgment: Judgment normal.        No results found for this or any previous visit (from the past 48 hour(s)).  Assessment & Plan  1. Essential hypertension Stable   2. Paroxysmal atrial fibrillation (HCC) Follows up with cards  3. Hypothyroidism following radioiodine therapy stable - levothyroxine (SYNTHROID) 88 MCG tablet; Take 1 tablet (88 mcg total) by  mouth daily.  Dispense: 90 tablet; Refill: 1  4. Chronic gout  due to renal impairment without tophus, unspecified site Controlled, follows up with rheumatology   5. Chronic kidney disease, stage IV (severe) ([place]) Follows up with nephrology   6. 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  Dispense: 90 tablet; Refill: 1  7. Abnormal LFTs Follows up with GI   8. Attention deficit disorder (ADD) without hyperactivity Well controlled - methylphenidate (RITALIN) 5 MG tablet; Take 1 tablet (5 mg total) by mouth daily. If needed for attention or focus  Dispense: 90 tablet; Refill: 0  9. Mixed hyperlipidemia Elevated, discussed diet - pravastatin (PRAVACHOL) 40 MG tablet; Take 1 tablet (40 mg total) by mouth daily.  Dispense: 90 tablet; Refill: 1  10. Insomnia, unspecified type stable - mirtazapine (REMERON) 15 MG tablet; Take 1 tablet (15 mg total) by mouth at bedtime.  Dispense: 90 tablet; Refill: 1

## 2019-03-12 NOTE — Patient Instructions (Addendum)
Bad cholesterol, also called low-density lipoprotein (LDL), carries cholesterol and other fats that your liver makes to your body tissue. If it builds up in blood vessels, LDL can cause heart disease and other health problems. Your LDL level should be below 100. If you have diabetes or a possible heart problem, your LDL should be below 70.  Eat: Eat 20 to 30 grams of soluble fiber every day. Foods such as fruits and vegetables, whole grains, beans, peas, nuts, and seeds can help lower LDL. Avoid: Saturated fats (Dairy foods - such as butter, cream, ghee, regular-fat milk and cheese. Meat - such as fatty cuts of beef, pork and lamb, processed meats like salami, sausages and the skin on chicken. Lard., fatty snack foods, cakes, biscuits, pies and deep fried foods) Avoid smoking  - IF you notice your blood pressure is staying between 90-110 on the top consistently or if you get lightheaded or dizzy with position changes let us or your nephrologist know to consider adjusting your losartan.

## 2019-03-13 LAB — HEPATIC FUNCTION PANEL
AG Ratio: 1.4 (calc) (ref 1.0–2.5)
ALT: 31 U/L (ref 9–46)
AST: 45 U/L — ABNORMAL HIGH (ref 10–35)
Albumin: 3.7 g/dL (ref 3.6–5.1)
Alkaline phosphatase (APISO): 113 U/L (ref 35–144)
Bilirubin, Direct: 0.1 mg/dL (ref 0.0–0.2)
Globulin: 2.7 g/dL (calc) (ref 1.9–3.7)
Indirect Bilirubin: 0.4 mg/dL (calc) (ref 0.2–1.2)
Total Bilirubin: 0.5 mg/dL (ref 0.2–1.2)
Total Protein: 6.4 g/dL (ref 6.1–8.1)

## 2019-03-13 LAB — LIPID PANEL
Cholesterol: 196 mg/dL (ref <200)
HDL: 36 mg/dL — ABNORMAL LOW (ref >=40)
LDL Cholesterol (Calc): 132 mg/dL (calc) — ABNORMAL HIGH
Non-HDL Cholesterol (Calc): 160 mg/dL (calc) — ABNORMAL HIGH (ref <130)
Total CHOL/HDL Ratio: 5.4 (calc) — ABNORMAL HIGH (ref <5.0)
Triglycerides: 150 mg/dL — ABNORMAL HIGH (ref <150)

## 2019-03-25 ENCOUNTER — Other Ambulatory Visit: Payer: Self-pay | Admitting: Family Medicine

## 2019-04-05 ENCOUNTER — Other Ambulatory Visit: Payer: Self-pay | Admitting: Family Medicine

## 2019-04-05 DIAGNOSIS — R7989 Other specified abnormal findings of blood chemistry: Secondary | ICD-10-CM

## 2019-04-05 DIAGNOSIS — R945 Abnormal results of liver function studies: Secondary | ICD-10-CM

## 2019-04-29 ENCOUNTER — Ambulatory Visit: Payer: Medicare HMO

## 2019-06-11 ENCOUNTER — Encounter: Payer: Self-pay | Admitting: Nurse Practitioner

## 2019-06-11 ENCOUNTER — Ambulatory Visit (INDEPENDENT_AMBULATORY_CARE_PROVIDER_SITE_OTHER): Payer: Medicare HMO | Admitting: Nurse Practitioner

## 2019-06-11 ENCOUNTER — Other Ambulatory Visit: Payer: Self-pay

## 2019-06-11 VITALS — BP 104/58 | HR 80 | Temp 96.8°F | Resp 14 | Ht 75.0 in | Wt 211.3 lb

## 2019-06-11 DIAGNOSIS — N2581 Secondary hyperparathyroidism of renal origin: Secondary | ICD-10-CM | POA: Diagnosis not present

## 2019-06-11 DIAGNOSIS — F988 Other specified behavioral and emotional disorders with onset usually occurring in childhood and adolescence: Secondary | ICD-10-CM

## 2019-06-11 DIAGNOSIS — E559 Vitamin D deficiency, unspecified: Secondary | ICD-10-CM

## 2019-06-11 DIAGNOSIS — E890 Postprocedural hypothyroidism: Secondary | ICD-10-CM

## 2019-06-11 DIAGNOSIS — G8194 Hemiplegia, unspecified affecting left nondominant side: Secondary | ICD-10-CM | POA: Diagnosis not present

## 2019-06-11 DIAGNOSIS — R748 Abnormal levels of other serum enzymes: Secondary | ICD-10-CM

## 2019-06-11 DIAGNOSIS — Z5181 Encounter for therapeutic drug level monitoring: Secondary | ICD-10-CM

## 2019-06-11 DIAGNOSIS — F339 Major depressive disorder, recurrent, unspecified: Secondary | ICD-10-CM

## 2019-06-11 DIAGNOSIS — M25552 Pain in left hip: Secondary | ICD-10-CM

## 2019-06-11 DIAGNOSIS — D631 Anemia in chronic kidney disease: Secondary | ICD-10-CM

## 2019-06-11 DIAGNOSIS — N184 Chronic kidney disease, stage 4 (severe): Secondary | ICD-10-CM

## 2019-06-11 DIAGNOSIS — I10 Essential (primary) hypertension: Secondary | ICD-10-CM

## 2019-06-11 DIAGNOSIS — I480 Paroxysmal atrial fibrillation: Secondary | ICD-10-CM

## 2019-06-11 DIAGNOSIS — Z13820 Encounter for screening for osteoporosis: Secondary | ICD-10-CM

## 2019-06-11 DIAGNOSIS — E782 Mixed hyperlipidemia: Secondary | ICD-10-CM

## 2019-06-11 DIAGNOSIS — Z79899 Other long term (current) drug therapy: Secondary | ICD-10-CM

## 2019-06-11 MED ORDER — METHYLPHENIDATE HCL 5 MG PO TABS: 5.0000 mg | ORAL_TABLET | Freq: Every day | ORAL | 0 refills | Status: DC

## 2019-06-11 NOTE — Assessment & Plan Note (Signed)
Per nephrology, will watch for copy of labs

## 2019-06-11 NOTE — Assessment & Plan Note (Signed)
Tolerating eliquis

## 2019-06-11 NOTE — Assessment & Plan Note (Signed)
Good mood, he feels "happy to be alive"  PHQ negative Con't prozac 20 mg daily, no SE

## 2019-06-11 NOTE — Assessment & Plan Note (Signed)
Pt has hold intolerance, no other sx Takes meds in am with food. Recheck TSH with cold intolerance and anemia, discussed proper administration of thyroid meds, will need to work on it if TSH out of range.

## 2019-06-11 NOTE — Assessment & Plan Note (Signed)
Controlled substance database checked and verified refill given today No side effects from medication including no rapid heart rate, palpitations, dry mouth, chest pain

## 2019-06-11 NOTE — Patient Instructions (Addendum)
We will call you in the next week with your lab results. We will let you know if we need to adjust how you take the thyroid medicine.  Return in October for the Flu clinic - just call the office to arrange  Monitor your blood pressure at home, Call us ASAP if it is regularly close to or below 100/60.   Ideally we would want you between 110-130/70-80.  Bone density scan - will order  And go get an xray of your hip

## 2019-06-11 NOTE — Assessment & Plan Note (Signed)
He is taking supplement with calcium

## 2019-06-11 NOTE — Assessment & Plan Note (Signed)
Nephrology drew labs today, pt is supplementing with B12, iron  Also CKD and hypothyroid - will watch for copy of labs from nephro

## 2019-06-11 NOTE — Assessment & Plan Note (Signed)
On pravastatin 40 mg, last labs suboptimal, HDL low, LDL high Needs recheck FLP and have discussed possible med change if lipids remain high

## 2019-06-11 NOTE — Assessment & Plan Note (Signed)
Per cardiology - HR regular today, heard murmur on exam, will screen chart to see if it has been noted before

## 2019-06-11 NOTE — Progress Notes (Signed)
Name: Duane [name]   MRN: [id number]    DOB: 03-11-1951   Date:06/11/2019       Progress Note  Chief Complaint  Patient presents with  . Follow-up  . Medication Refill     Subjective:   Duane [name] is a 68 y.o. male, presents to clinic for routine follow up on the conditions listed above.  Hypertension Patient is on losartan 25mg . Saw nephrologist this morning at Dr. Elmon Else 116/73 and here now is lower 104/58.  Once in a while he will have a lightheaded episode if he stands up quickly.  He has a BP cuff at home, but they do not check it regularly. He feels like hes been eating and drinking enough. Takes medications as prescribed with no missed doses a month.  Denies chest pain, headaches, blurry vision, SOB. BP Readings from Last 3 Encounters:  06/11/19 (!) 104/58  03/12/19 108/70  12/10/18 112/68     Atrial Fibrillation/PAF - sees cardiology, Dr. Ubaldo Glassing.  Previously on amiodorone, now only on cardura Sees Dr. Ubaldo Glassing- cardiology. Is on eliquis 5mg  BID. some bleeding if he bumps his arms, no BRBPR, no melena, he does feel cold easily.  Denies palpitations  Hyperlipidemia Patient rx pravastatin   Takes medications as prescribed with no missed doses a month.    He denies myalgias or fatigue. Last labs it has been elevated, but also had elevated LFTs so recommended pravastatin by GI.  He has a healthy diet.  Is not fasting today, will return another day to do labs  Lab Results  Component Value Date   CHOL 196 03/12/2019   CHOL 217 (H) 01/13/2019   CHOL 132 05/22/2018   Lab Results  Component Value Date   HDL 36 (L) 03/12/2019   HDL 34 (L) 01/13/2019   HDL 40 (L) 05/22/2018   Lab Results  Component Value Date   LDLCALC 132 (H) 03/12/2019   LDLCALC 152 (H) 01/13/2019   LDLCALC 72 05/22/2018   Lab Results  Component Value Date   TRIG 150 (H) 03/12/2019   TRIG 179 (H) 01/13/2019   TRIG 110 05/22/2018   Lab Results  Component Value Date    CHOLHDL 5.4 (H) 03/12/2019   CHOLHDL 6.4 (H) 01/13/2019   CHOLHDL 3.3 05/22/2018   No results found for: LDLDIRECT   Hypothyroidism Patient rx levothryoxine 88 mcg daily.  Takes in the am before he eats breakfast 10 minutes later. He is taking iron supplement several times a week as well, has been taking it like this for a long time.   Patient had graves disease and was treated with radioactive iodine over ten years ago resulting in hypothyroidism.  Patient denies fatigue/palpitations, insomnia, constipation/diarrhea, hot intolerances Lab Results  Component Value Date   TSH 4.290 10/21/2018     Gout Sees Dr. Jefm Bryant- Rheumatology. - per specialist and nephrologist   Insomnia Has struggled with this after stroke and was started on remeron- has been sleeping well.  He has not complaints, but is also not completely sure if he is still on remeron, his wife is not with him today, but he doesn't believe there have been any med changes.  ADD Takes ritalin daily to help with focus and complete task. This medicine was started after his stroke when he was having increased difficulty completing tasks.  he requests refill today.  Controlled substance database checked. No rapid heart rate, dry mouth, CP.  Depression Started after he had stroke resulting in left sided  hemiparesis. He is taking fluoxetine 20mg  daily without issue.  Depression screen [facility] 2/9 06/11/2019 03/12/2019 12/10/2018  Decreased Interest 0 0 0  Down, Depressed, Hopeless 0 0 0  PHQ - 2 Score 0 0 0  Altered sleeping 0 0 0  Tired, decreased energy 0 0 0  Change in appetite 0 0 0  Feeling bad or failure about yourself  0 0 0  Trouble concentrating 0 0 0  Moving slowly or fidgety/restless 0 0 0  Suicidal thoughts 0 0 0  PHQ-9 Score 0 0 0  Difficult doing work/chores Not difficult at all Not difficult at all Not difficult at all  Some recent data might be hidden    LFT's elevated/hepatic steatosis - saw Dr. Vicente Males for  work up and released from GI after improving LFTs secondary to stopping amiodorone    Hx of stroke - has left hemiparesis, ADD, depression, dysphagia after stroke.  Has seen neuro in the past.   Pt on eliquis.  Anemia- secondary to renal disease?  May also be to deficiencies - I will have to review chart, pt on iron and B12 supplement, also has hyperparathyroid and Stage 4 CKD - managed by nephrology - Dr. Holley Raring  He also complains of left hip pain that has been intermittent, usually occurs when he has to stand from sitting if he has been sitting for a while he feels sharp pain shoots in his hip area radiates down his leg sometimes into his groin and he is walking funny he says but today he is not very symptomatic he has not had any swelling or redness over that area and he denies any injury.  Patient Active Problem List   Diagnosis Date Noted  . Aortic ectasia, abdominal ([place]) 05/22/2018  . Hyperparathyroidism, secondary renal ([place]) 10/02/2017  . Bursitis of shoulder 06/23/2017  . Controlled substance agreement signed 05/23/2017  . On stimulant medication 02/22/2017  . Chronic low back pain without sciatica 08/21/2016  . Low libido 05/23/2016  . Colon polyp 11/27/2015  . Vitamin D deficiency 09/04/2015  . 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 Anemia, chronic renal failure 06/09/2015  . Chronic kidney disease, stage IV (severe) ([place])   . Hypertension   . Chronic gout due to renal impairment   . Hyperlipidemia   . Hypothyroidism following radioiodine therapy   . Restrictive lung disease   . A-fib ([place])   . History of gout 02/28/2014  . Dysphagia following cerebral infarction 02/21/2014  . History of arterial ischemic stroke 02/21/2014  . Left hemiparesis ([place]) 02/11/2014    Past Surgical History:  Procedure Laterality Date  . CATARACT EXTRACTION W/PHACO Right 08/27/2016   Procedure: CATARACT EXTRACTION PHACO AND INTRAOCULAR LENS PLACEMENT (IOC);  Surgeon: Eulogio Bear,  MD;  Location: [place];  Service: Ophthalmology;  Laterality: Right;  RIGHT  . ENTEROSTOMY CLOSURE  11/27/15   ileostomy takedown  . EXPLORATORY LAPAROTOMY  11/27/15  . illeostomy  07/2014  . POLYPECTOMY  10/15   GI surgery to remove polyp    Family History  Problem Relation Age of Onset  . Arthritis Mother   . Heart disease Mother   . Heart attack Father   . Heart disease Father   . Cirrhosis Sister   . Hyperparathyroidism Neg Hx     Social History   Socioeconomic History  . Marital status: Married    Spouse name: Curt Bears  . Number of children: 2  . Years of education: Not on file  .  Highest education level: 12th grade  Occupational History  . Occupation: Retired  Scientific laboratory technician  . Financial resource strain: Not hard at all  . Food insecurity    Worry: Never true    Inability: Never true  . Transportation needs    Medical: No    Non-medical: No  Tobacco Use  . Smoking status: Never Smoker  . Smokeless tobacco: Never Used  . Tobacco comment: smoking cessation materials not required  Substance and Sexual Activity  . Alcohol use: No  . Drug use: No  . Sexual activity: Not Currently  Lifestyle  . Physical activity    Days per week: 0 days    Minutes per session: 0 min  . Stress: Rather much  Relationships  . Social Herbalist on phone: Patient refused    Gets together: Patient refused    Attends religious service: Patient refused    Active member of club or organization: Patient refused    Attends meetings of clubs or organizations: Patient refused    Relationship status: Patient refused  . Intimate partner violence    Fear of current or ex partner: No    Emotionally abused: No    Physically abused: No    Forced sexual activity: No  Other Topics Concern  . Not on file  Social History Narrative  . Not on file     Current Outpatient Medications:  .  allopurinol (ZYLOPRIM) 300 MG tablet, Take 300 mg by mouth daily., Disp: , Rfl:  .   apixaban (ELIQUIS) 5 MG TABS tablet, Take 1 tablet (5 mg total) by mouth 2 (two) times daily., Disp: 180 tablet, Rfl: 3 .  calcitRIOL (ROCALTROL) 0.25 MCG capsule, Take 0.25 mcg daily by mouth. , Disp: , Rfl:  .  Cholecalciferol (VITAMIN D3) 2000 units capsule, Take 2,000 Units by mouth daily., Disp: , Rfl:  .  cyanocobalamin 500 MCG tablet, Take 500 mcg by mouth daily., Disp: , Rfl:  .  doxazosin (CARDURA) 1 MG tablet, Take 1 tablet by mouth once daily, Disp: 90 tablet, Rfl: 0 .  famotidine (PEPCID) 20 MG tablet, Take 1 tablet by mouth twice daily, Disp: 180 tablet, Rfl: 0 .  ferrous sulfate 325 (65 FE) MG tablet, Take 325 mg by mouth 3 (three) times daily with meals. , Disp: , Rfl:  .  FLUoxetine (PROZAC) 20 MG tablet, Take 1 tablet (20 mg total) by mouth daily., Disp: 90 tablet, Rfl: 1 .  levothyroxine (SYNTHROID) 88 MCG tablet, Take 1 tablet (88 mcg total) by mouth daily., Disp: 90 tablet, Rfl: 1 .  losartan (COZAAR) 25 MG tablet, Take 25 mg by mouth daily., Disp: , Rfl:  .  methylphenidate (RITALIN) 5 MG tablet, Take 1 tablet (5 mg total) by mouth daily. If needed for attention or focus, Disp: 90 tablet, Rfl: 0 .  mirtazapine (REMERON) 15 MG tablet, Take 1 tablet (15 mg total) by mouth at bedtime., Disp: 90 tablet, Rfl: 1 .  pravastatin (PRAVACHOL) 40 MG tablet, Take 1 tablet (40 mg total) by mouth daily., Disp: 90 tablet, Rfl: 1  Allergies  Allergen Reactions  . Bactrim [Sulfamethoxazole-Trimethoprim] Other (See Comments)    Hyperkalemia  . Chlorpromazine Other (See Comments)    Hiccups, hallucinations  . Cyclobenzaprine Other (See Comments)    Sleeps for 8 hrs after taking it  . Oxycodone Other (See Comments)    Excessive sleepiness    I personally reviewed active problem list, medication list, allergies, social history, notes  from last encounter, lab results with the patient/caregiver today.  Review of Systems  Constitutional: Negative.  Negative for activity change, appetite  change, fatigue and unexpected weight change.  HENT: Negative.   Eyes: Negative.   Respiratory: Negative.  Negative for cough, chest tightness and shortness of breath.   Cardiovascular: Negative.  Negative for chest pain, palpitations and leg swelling.  Gastrointestinal: Negative.  Negative for abdominal pain, blood in stool, constipation, diarrhea, nausea and vomiting.  Endocrine: Positive for cold intolerance.  Genitourinary: Negative.  Negative for decreased urine volume, difficulty urinating, testicular pain and urgency.  Musculoskeletal: Negative for myalgias.  Skin: Negative.  Negative for color change and pallor.  Allergic/Immunologic: Negative.   Neurological: Positive for light-headedness. Negative for syncope, weakness and numbness.  Hematological: Negative for adenopathy. Bruises/bleeds easily.  Psychiatric/Behavioral: Negative.  Negative for confusion, dysphoric mood, self-injury and suicidal ideas. The patient is not nervous/anxious.   All other systems reviewed and are negative.    Objective:    Vitals:   06/11/19 1442  BP: (!) 104/58  Pulse: 80  Resp: 14  Temp: (!) 96.8 F (36 C)  SpO2: 98%  Weight: 211 lb 4.8 oz (95.8 kg)  Height: 6\' 3"  (1.905 m)    Body mass index is 26.41 kg/m.  Physical Exam Vitals signs and nursing note reviewed.  Constitutional:      General: He is not in acute distress.    Appearance: Normal appearance. He is well-developed. He is not ill-appearing, toxic-appearing or diaphoretic.  HENT:     Head: Normocephalic and atraumatic.     Jaw: No trismus.     Right Ear: Tympanic membrane, ear canal and external ear normal.     Left Ear: Tympanic membrane, ear canal and external ear normal.     Nose: Nose normal. No mucosal edema.     Right Sinus: No maxillary sinus tenderness or frontal sinus tenderness.     Left Sinus: No maxillary sinus tenderness or frontal sinus tenderness.     Mouth/Throat:     Mouth: Mucous membranes are moist.      Pharynx: Oropharynx is clear. Uvula midline. No oropharyngeal exudate, posterior oropharyngeal erythema or uvula swelling.  Eyes:     General: Lids are normal. No scleral icterus.    Conjunctiva/sclera: Conjunctivae normal.     Pupils: Pupils are equal, round, and reactive to light.  Neck:     Musculoskeletal: Normal range of motion and neck supple.     Trachea: Trachea and phonation normal. No tracheal deviation.  Cardiovascular:     Rate and Rhythm: Normal rate and regular rhythm.     Pulses: Normal pulses.          Radial pulses are 2+ on the right side and 2+ on the left side.       Posterior tibial pulses are 2+ on the right side and 2+ on the left side.     Heart sounds: Murmur present. No friction rub. No gallop.   Pulmonary:     Effort: Pulmonary effort is normal.     Breath sounds: Normal breath sounds. No wheezing, rhonchi or rales.  Abdominal:     General: Bowel sounds are normal. There is no distension.     Palpations: Abdomen is soft.     Tenderness: There is no abdominal tenderness. There is no guarding or rebound.  Musculoskeletal: Normal range of motion.  Skin:    General: Skin is warm and dry.     Capillary Refill: Capillary  refill takes less than 2 seconds.     Findings: No rash.     Comments: Cool hands, no cyanosis  Neurological:     Mental Status: He is alert and oriented to person, place, and time.     Gait: Gait normal.  Psychiatric:        Attention and Perception: He is inattentive.        Mood and Affect: Mood and affect normal.        Speech: Speech is delayed.        Thought Content: Thought content does not include homicidal or suicidal ideation. Thought content does not include homicidal or suicidal plan.        Cognition and Memory: Memory is impaired.     Comments: Pt here alone      No results found for this or any previous visit (from the past 2160 hour(s)).  Diabetic Foot Exam: Diabetic Foot Exam - Simple   No data filed        PHQ2/9: Depression screen [facility] 2/9 06/11/2019 03/12/2019 12/10/2018 08/21/2018 08/21/2018  Decreased Interest 0 0 0 0 0  Down, Depressed, Hopeless 0 0 0 0 0  PHQ - 2 Score 0 0 0 0 0  Altered sleeping 0 0 0 0 0  Tired, decreased energy 0 0 0 3 0  Change in appetite 0 0 0 0 0  Feeling bad or failure about yourself  0 0 0 0 0  Trouble concentrating 0 0 0 0 0  Moving slowly or fidgety/restless 0 0 0 0 0  Suicidal thoughts 0 0 0 0 0  PHQ-9 Score 0 0 0 3 0  Difficult doing work/chores Not difficult at all Not difficult at all Not difficult at all Not difficult at all Not difficult at all  Some recent data might be hidden    phq 9 is negative Reviewed today  Fall Risk: Fall Risk  06/11/2019 03/12/2019 12/10/2018 08/21/2018 05/22/2018  Falls in the past year? 0 0 0 0 No  Number falls in past yr: 0 - - 0 -  Injury with Fall? 0 - - - -  Risk for fall due to : - - - - -      Functional Status Survey: Is the patient deaf or have difficulty hearing?: No Does the patient have difficulty seeing, even when wearing glasses/contacts?: No Does the patient have difficulty concentrating, remembering, or making decisions?: No Does the patient have difficulty walking or climbing stairs?: No Does the patient have difficulty dressing or bathing?: No Does the patient have difficulty doing errands alone such as visiting a doctor's office or shopping?: No    Assessment & Plan:   Problem List Items Addressed This Visit      Cardiovascular and Mediastinum   Hypertension - Primary (Chronic)    BP soft today, has cuff at home, instructed to monitor, and he needs to notify us if soft 100/60 on average or notify us ASAP if any lower, may need to decrease losartan? To half dose. Has been higher in the past, and was good this am at nephrology appt.      A-fib [facility])    Per cardiology - HR regular today, heard murmur on exam, will screen chart to see if it has been noted before        Endocrine    Hypothyroidism following radioiodine therapy (Chronic)    Pt has hold intolerance, no other sx Takes meds in am with food. Recheck TSH  with cold intolerance and anemia, discussed proper administration of thyroid meds, will need to work on it if TSH out of range.      Relevant Orders   TSH   Hyperparathyroidism, secondary renal ([place])    Managed per nephrology - levels drawn today with Dr. Holley Raring      Relevant Orders   DG Bone Density     Nervous and Auditory   Left hemiparesis ([place])    Tolerating eliquis        Genitourinary   Chronic kidney disease, stage IV (severe) ([facility]) (Chronic)    Per nephrology, will watch for copy of labs      Relevant Orders   DG Bone Density   Anemia, chronic renal failure    Nephrology drew labs today, pt is supplementing with B12, iron  Also CKD and hypothyroid - will watch for copy of labs from nephro      Relevant Orders   TSH     Other   Hyperlipidemia    On pravastatin 40 mg, last labs suboptimal, HDL low, LDL high Needs recheck FLP and have discussed possible med change if lipids remain high      Relevant Orders   Lipid panel   Vitamin D deficiency    He is taking supplement with calcium      Relevant Orders   DG Bone Density   On stimulant medication    Controlled substance database checked and verified refill given today No side effects from medication including no rapid heart rate, palpitations, dry mouth, chest pain      Episode of recurrent major depressive disorder (HCC)    Good mood, he feels "happy to be alive"  PHQ negative Con't prozac 20 mg daily, no SE       Other Visit Diagnoses    Medication monitoring encounter       Relevant Orders   Lipid panel   TSH   Elevated liver enzymes       seen by GI, back to PCP, improving LFTs after stopping amiodorone, RUQ [place] + for hepatic steatosis   Relevant Orders   Lipid panel   Attention deficit disorder (ADD) without hyperactivity       Relevant Medications    methylphenidate (RITALIN) 5 MG tablet   Left hip pain       pain in left hip joint, most often worse after immobility for a while, sometimes limps   Relevant Orders   DG Hip Unilat W OR W/O Pelvis 2-3 Views Left   Screening for osteoporosis       With his multiple deficiencies, CKD, HPT feel he needs screening to check denisty/hip fx risk, with new left hip pain and antalgic gait   Relevant Orders   DG Bone Density       Return in about 3 months (around 09/11/2019) for Routine follow-up, Medicare Well Visit.   Delsa Grana, PA-C 06/11/19 2:49 PM

## 2019-06-11 NOTE — Assessment & Plan Note (Signed)
Managed per nephrology - levels drawn today with Dr. Holley Raring

## 2019-06-11 NOTE — Assessment & Plan Note (Signed)
BP soft today, has cuff at home, instructed to monitor, and he needs to notify us if soft 100/60 on average or notify us ASAP if any lower, may need to decrease losartan? To half dose. Has been higher in the past, and was good this am at nephrology appt.

## 2019-06-15 ENCOUNTER — Ambulatory Visit
Admission: RE | Admit: 2019-06-15 | Discharge: 2019-06-15 | Disposition: A | Discharge status: Home / Self Care | Payer: Medicare HMO | Source: Ambulatory Visit | Attending: Family Medicine | Admitting: Family Medicine

## 2019-06-15 ENCOUNTER — Other Ambulatory Visit: Payer: Self-pay

## 2019-06-15 ENCOUNTER — Ambulatory Visit: Payer: Medicare HMO

## 2019-06-15 DIAGNOSIS — M25552 Pain in left hip: Secondary | ICD-10-CM | POA: Diagnosis present

## 2019-06-16 ENCOUNTER — Other Ambulatory Visit: Payer: Self-pay | Admitting: Family Medicine

## 2019-06-16 ENCOUNTER — Encounter: Payer: Self-pay | Admitting: Family Medicine

## 2019-06-16 DIAGNOSIS — M160 Bilateral primary osteoarthritis of hip: Secondary | ICD-10-CM

## 2019-06-16 DIAGNOSIS — M25552 Pain in left hip: Secondary | ICD-10-CM

## 2019-06-17 ENCOUNTER — Ambulatory Visit (INDEPENDENT_AMBULATORY_CARE_PROVIDER_SITE_OTHER): Payer: Medicare HMO

## 2019-06-17 VITALS — BP 134/80 | Ht 76.0 in | Wt 209.0 lb

## 2019-06-17 DIAGNOSIS — Z0000 Encounter for general adult medical examination without abnormal findings: Secondary | ICD-10-CM | POA: Diagnosis not present

## 2019-06-17 NOTE — Patient Instructions (Signed)
Duane [name] , Thank you for taking time to come for your Medicare Wellness Visit. I appreciate your ongoing commitment to your health goals. Please review the following plan we discussed and let me know if I can assist you in the future.   Screening recommendations/referrals: Colonoscopy: done 07/07/14. Repeat in 2025. Recommended yearly ophthalmology/optometry visit for glaucoma screening and checkup Recommended yearly dental visit for hygiene and checkup  Vaccinations: Influenza vaccine: done 08/21/18 Pneumococcal vaccine: done 03/12/18 Tdap vaccine: done 01/10/16 Shingles vaccine: Shingrix discussed. Please contact your pharmacy for coverage information.   Advanced directives: Advance directive discussed with you today. I have provided a copy for you to complete at home and have notarized. Once this is complete please bring a copy in to our office so we can scan it into your chart.  Conditions/risks identified: Recommend drinking 6-8 glasses of water per day.   Next appointment: Please follow up in one year for your Medicare Annual Wellness visit.    Preventive Care 68 Years and Older, Male Preventive care refers to lifestyle choices and visits with your health care provider that can promote health and wellness. What does preventive care include?  A yearly physical exam. This is also called an annual well check.  Dental exams once or twice a year.  Routine eye exams. Ask your health care provider how often you should have your eyes checked.  Personal lifestyle choices, including:  Daily care of your teeth and gums.  Regular physical activity.  Eating a healthy diet.  Avoiding tobacco and drug use.  Limiting alcohol use.  Practicing safe sex.  Taking low doses of aspirin every day.  Taking vitamin and mineral supplements as recommended by your health care provider. What happens during an annual well check? The services and screenings done by your health care provider  during your annual well check will depend on your age, overall health, lifestyle risk factors, and family history of disease. Counseling  Your health care provider may ask you questions about your:  Alcohol use.  Tobacco use.  Drug use.  Emotional well-being.  Home and relationship well-being.  Sexual activity.  Eating habits.  History of falls.  Memory and ability to understand (cognition).  Work and work Statistician. Screening  You may have the following tests or measurements:  Height, weight, and BMI.  Blood pressure.  Lipid and cholesterol levels. These may be checked every 5 years, or more frequently if you are over 64 years old.  Skin check.  Lung cancer screening. You may have this screening every year starting at age 45 if you have a 30-pack-year history of smoking and currently smoke or have quit within the past 15 years.  Fecal occult blood test (FOBT) of the stool. You may have this test every year starting at age 51.  Flexible sigmoidoscopy or colonoscopy. You may have a sigmoidoscopy every 5 years or a colonoscopy every 10 years starting at age 60.  Prostate cancer screening. Recommendations will vary depending on your family history and other risks.  Hepatitis C blood test.  Hepatitis B blood test.  Sexually transmitted disease (STD) testing.  Diabetes screening. This is done by checking your blood sugar (glucose) after you have not eaten for a while (fasting). You may have this done every 1-3 years.  Abdominal aortic aneurysm (AAA) screening. You may need this if you are a current or former smoker.  Osteoporosis. You may be screened starting at age 50 if you are at high risk. Talk with  your health care provider about your test results, treatment options, and if necessary, the need for more tests. Vaccines  Your health care provider may recommend certain vaccines, such as:  Influenza vaccine. This is recommended every year.  Tetanus,  diphtheria, and acellular pertussis (Tdap, Td) vaccine. You may need a Td booster every 10 years.  Zoster vaccine. You may need this after age 53.  Pneumococcal 13-valent conjugate (PCV13) vaccine. One dose is recommended after age 67.  Pneumococcal polysaccharide (PPSV23) vaccine. One dose is recommended after age 32. Talk to your health care provider about which screenings and vaccines you need and how often you need them. This information is not intended to replace advice given to you by your health care provider. Make sure you discuss any questions you have with your health care provider. Document Released: 10/27/2015 Document Revised: 06/19/2016 Document Reviewed: 08/01/2015 Elsevier Interactive Patient Education  2017 [place] Prevention in the Home Falls can cause injuries. They can happen to people of all ages. There are many things you can do to make your home safe and to help prevent falls. What can I do on the outside of my home?  Regularly fix the edges of walkways and driveways and fix any cracks.  Remove anything that might make you trip as you walk through a door, such as a raised step or threshold.  Trim any bushes or trees on the path to your home.  Use bright outdoor lighting.  Clear any walking paths of anything that might make someone trip, such as rocks or tools.  Regularly check to see if handrails are loose or broken. Make sure that both sides of any steps have handrails.  Any raised decks and porches should have guardrails on the edges.  Have any leaves, snow, or ice cleared regularly.  Use sand or salt on walking paths during winter.  Clean up any spills in your garage right away. This includes oil or grease spills. What can I do in the bathroom?  Use night lights.  Install grab bars by the toilet and in the tub and shower. Do not use towel bars as grab bars.  Use non-skid mats or decals in the tub or shower.  If you need to sit down in  the shower, use a plastic, non-slip stool.  Keep the floor dry. Clean up any water that spills on the floor as soon as it happens.  Remove soap buildup in the tub or shower regularly.  Attach bath mats securely with double-sided non-slip rug tape.  Do not have throw rugs and other things on the floor that can make you trip. What can I do in the bedroom?  Use night lights.  Make sure that you have a light by your bed that is easy to reach.  Do not use any sheets or blankets that are too big for your bed. They should not hang down onto the floor.  Have a firm chair that has side arms. You can use this for support while you get dressed.  Do not have throw rugs and other things on the floor that can make you trip. What can I do in the kitchen?  Clean up any spills right away.  Avoid walking on wet floors.  Keep items that you use a lot in easy-to-reach places.  If you need to reach something above you, use a strong step stool that has a grab bar.  Keep electrical cords out of the way.  Do not  use floor polish or wax that makes floors slippery. If you must use wax, use non-skid floor wax.  Do not have throw rugs and other things on the floor that can make you trip. What can I do with my stairs?  Do not leave any items on the stairs.  Make sure that there are handrails on both sides of the stairs and use them. Fix handrails that are broken or loose. Make sure that handrails are as long as the stairways.  Check any carpeting to make sure that it is firmly attached to the stairs. Fix any carpet that is loose or worn.  Avoid having throw rugs at the top or bottom of the stairs. If you do have throw rugs, attach them to the floor with carpet tape.  Make sure that you have a light switch at the top of the stairs and the bottom of the stairs. If you do not have them, ask someone to add them for you. What else can I do to help prevent falls?  Wear shoes that:  Do not have high  heels.  Have rubber bottoms.  Are comfortable and fit you well.  Are closed at the toe. Do not wear sandals.  If you use a stepladder:  Make sure that it is fully opened. Do not climb a closed stepladder.  Make sure that both sides of the stepladder are locked into place.  Ask someone to hold it for you, if possible.  Clearly mark and make sure that you can see:  Any grab bars or handrails.  First and last steps.  Where the edge of each step is.  Use tools that help you move around (mobility aids) if they are needed. These include:  Canes.  Walkers.  Scooters.  Crutches.  Turn on the lights when you go into a dark area. Replace any light bulbs as soon as they burn out.  Set up your furniture so you have a clear path. Avoid moving your furniture around.  If any of your floors are uneven, fix them.  If there are any pets around you, be aware of where they are.  Review your medicines with your doctor. Some medicines can make you feel dizzy. This can increase your chance of falling. Ask your doctor what other things that you can do to help prevent falls. This information is not intended to replace advice given to you by your health care provider. Make sure you discuss any questions you have with your health care provider. Document Released: 07/27/2009 Document Revised: 03/07/2016 Document Reviewed: 11/04/2014 Elsevier Interactive Patient Education  2017 [employer].

## 2019-06-17 NOTE — Progress Notes (Signed)
Subjective:   Duane [name] is a 68 y.o. male who presents for Medicare Annual/Subsequent preventive examination.  Virtual Visit via Telephone Note  I connected with Kinnie Scales on 06/17/19 at 10:40 AM EDT by telephone and verified that I am speaking with the correct person using two identifiers.  Medicare Annual Wellness visit completed telephonically due to Covid-19 pandemic.   Location: Patient: home Provider: office   I discussed the limitations, risks, security and privacy concerns of performing an evaluation and management service by telephone and the availability of in person appointments. The patient expressed understanding and agreed to proceed.  Some vital signs may be absent or patient reported.   Clemetine Marker, LPN    Review of Systems:   Cardiac Risk Factors include: advanced age (>39men, >12 women);hypertension;male gender;dyslipidemia     Objective:    Vitals: BP 134/80   Ht 6\' 4"  (1.93 m)   Wt 209 lb (94.8 kg)   BMI 25.44 kg/m   Body mass index is 25.44 kg/m.  Advanced Directives 06/17/2019 03/12/2018 05/23/2017 02/19/2017 12/26/2016 11/21/2016 08/27/2016  Does Patient Have a Medical Advance Directive? No No No No No No No  Would patient like information on creating a medical advance directive? Yes (MAU/Ambulatory/Procedural Areas - Information given) Yes (MAU/Ambulatory/Procedural Areas - Information given) - - - - No - patient declined information    Tobacco Social History   Tobacco Use  Smoking Status Never Smoker  Smokeless Tobacco Never Used  Tobacco Comment   smoking cessation materials not required     Counseling given: Not Answered Comment: smoking cessation materials not required   Clinical Intake:  Pre-visit preparation completed: Yes  Pain : 0-10 Pain Score: 4  Pain Type: Acute pain Pain Location: Hip Pain Orientation: Left Pain Descriptors / Indicators: Aching, Discomfort Pain Onset: 1 to 4 weeks ago Pain  Frequency: Intermittent     BMI - recorded: 25.44 Nutritional Status: BMI 25 -29 Overweight Nutritional Risks: None Diabetes: No  How often do you need to have someone help you when you read instructions, pamphlets, or other written materials from your doctor or pharmacy?: 1 - Never  Interpreter Needed?: No  Information entered by :: Clemetine Marker LPN  Past Medical History:  Diagnosis Date  . A-fib [facility])    requiring life-long anticouagulation  . Aortic ectasia, abdominal ([place]) 05/22/2018   [place] Sept 2018  . Cerebellar infarct ([place])   . CKD (chronic kidney disease)    creatinine baseline 1.3-1.5 in 2015  . GERD (gastroesophageal reflux disease)   . Gout   . Graves disease   . H/O hyperkalemia    with Bactrim  . Hyperlipidemia    requiring life long statin therapy  . Hyperparathyroidism, secondary renal ([place]) 10/02/2017  . Hypertension   . Hypothyroidism   . Left hemiparesis [facility]) May 2015   s/p strke  . Poor short term memory   . Renal insufficiency   . Restrictive lung disease    mild  . Stroke ([place]) 02/20/14   left side hemiparesis, dysphagia  . UTI (urinary tract infection) due to Enterococcus May 2015  . Vitamin D deficiency disease    Past Surgical History:  Procedure Laterality Date  . CATARACT EXTRACTION W/PHACO Right 08/27/2016   Procedure: CATARACT EXTRACTION PHACO AND INTRAOCULAR LENS PLACEMENT (IOC);  Surgeon: Eulogio Bear, MD;  Location: [place];  Service: Ophthalmology;  Laterality: Right;  RIGHT  . ENTEROSTOMY CLOSURE  11/27/15   ileostomy takedown  .  EXPLORATORY LAPAROTOMY  11/27/15  . illeostomy  07/2014  . POLYPECTOMY  10/15   GI surgery to remove polyp   Family History  Problem Relation Age of Onset  . Arthritis Mother   . Heart disease Mother   . Heart attack Father   . Heart disease Father   . Cirrhosis Sister   . Hyperparathyroidism Neg Hx    Social History   Socioeconomic History  . Marital status: Married    Spouse  name: Curt Bears  . Number of children: 2  . Years of education: Not on file  . Highest education level: 12th grade  Occupational History  . Occupation: Retired  Scientific laboratory technician  . Financial resource strain: Not hard at all  . Food insecurity    Worry: Never true    Inability: Never true  . Transportation needs    Medical: No    Non-medical: No  Tobacco Use  . Smoking status: Never Smoker  . Smokeless tobacco: Never Used  . Tobacco comment: smoking cessation materials not required  Substance and Sexual Activity  . Alcohol use: No  . Drug use: No  . Sexual activity: Not Currently  Lifestyle  . Physical activity    Days per week: 0 days    Minutes per session: 0 min  . Stress: Only a little  Relationships  . Social Herbalist on phone: Patient refused    Gets together: Patient refused    Attends religious service: Patient refused    Active member of club or organization: Patient refused    Attends meetings of clubs or organizations: Patient refused    Relationship status: Married  Other Topics Concern  . Not on file  Social History Narrative  . Not on file    Outpatient Encounter Medications as of 06/17/2019  Medication Sig  . allopurinol (ZYLOPRIM) 300 MG tablet Take 300 mg by mouth daily.  Marland Kitchen apixaban (ELIQUIS) 5 MG TABS tablet Take 1 tablet (5 mg total) by mouth 2 (two) times daily.  . calcitRIOL (ROCALTROL) 0.25 MCG capsule Take 0.25 mcg daily by mouth.   . Cholecalciferol (VITAMIN D3) 2000 units capsule Take 2,000 Units by mouth daily.  . cyanocobalamin 500 MCG tablet Take 500 mcg by mouth daily.  Marland Kitchen doxazosin (CARDURA) 1 MG tablet Take 1 tablet by mouth once daily  . famotidine (PEPCID) 20 MG tablet Take 1 tablet by mouth twice daily  . ferrous sulfate 325 (65 FE) MG tablet Take 325 mg by mouth 3 (three) times daily with meals.   Marland Kitchen FLUoxetine (PROZAC) 20 MG tablet Take 1 tablet (20 mg total) by mouth daily.  Marland Kitchen levothyroxine (SYNTHROID) 88 MCG tablet Take 1  tablet (88 mcg total) by mouth daily.  Marland Kitchen losartan (COZAAR) 25 MG tablet Take 25 mg by mouth daily.  . methylphenidate (RITALIN) 5 MG tablet Take 1 tablet (5 mg total) by mouth daily. If needed for attention or focus  . mirtazapine (REMERON) 15 MG tablet Take 1 tablet (15 mg total) by mouth at bedtime.  . pravastatin (PRAVACHOL) 40 MG tablet Take 1 tablet (40 mg total) by mouth daily.   No facility-administered encounter medications on file as of 06/17/2019.     Activities of Daily Living In your present state of health, do you have any difficulty performing the following activities: 06/17/2019 06/11/2019  Hearing? N N  Comment declines hearing aids -  Vision? N N  Difficulty concentrating or making decisions? N N  Walking or climbing  stairs? N N  Dressing or bathing? N N  Doing errands, shopping? N N  Preparing Food and eating ? N -  Using the Toilet? N -  In the past six months, have you accidently leaked urine? N -  Do you have problems with loss of bowel control? N -  Managing your Medications? N -  Managing your Finances? N -  Housekeeping or managing your Housekeeping? N -  Some recent data might be hidden    Patient Care Team: Delsa Grana, PA-C as PCP - General (Family Medicine) Ubaldo Glassing Javier Docker, MD as Consulting Physician (Cardiology) Anthonette Legato, MD (Internal Medicine) Emmaline Kluver., MD as Consulting Physician (Rheumatology) Jonathon Bellows, MD as Consulting Physician (Gastroenterology)   Assessment:   This is a routine wellness examination for [place].  Exercise Activities and Dietary recommendations Current Exercise Habits: The patient does not participate in regular exercise at present, Exercise limited by: None identified  Goals    . DIET - INCREASE WATER INTAKE     Recommend to drink at least 6-8 8oz glasses of water per day.       Fall Risk Fall Risk  06/17/2019 06/11/2019 03/12/2019 12/10/2018 08/21/2018  Falls in the past year? 0 0 0 0 0  Number falls  in past yr: 0 0 - - 0  Injury with Fall? 0 0 - - -  Risk for fall due to : - - - - -  Follow up Falls prevention discussed - - - -   FALL RISK PREVENTION PERTAINING TO THE HOME:  Any stairs in or around the home? Yes  If so, do they handrails? Yes   Home free of loose throw rugs in walkways, pet beds, electrical cords, etc? Yes  Adequate lighting in your home to reduce risk of falls? Yes   ASSISTIVE DEVICES UTILIZED TO PREVENT FALLS:  Life alert? No  Use of a cane, walker or w/c? No  Grab bars in the bathroom? Yes  Shower chair or bench in shower? No  Elevated toilet seat or a handicapped toilet? Yes   DME ORDERS:  DME order needed?  No   TIMED UP AND GO:  Was the test performed? No . Telephonic visit.    Education: Fall risk prevention has been discussed.  Intervention(s) required? No   Depression Screen PHQ 2/9 Scores 06/17/2019 06/11/2019 03/12/2019 12/10/2018  PHQ - 2 Score 0 0 0 0  PHQ- 9 Score - 0 0 0    Cognitive Function     6CIT Screen 06/17/2019 03/12/2018  What Year? 0 points 0 points  What month? 0 points 0 points  What time? 0 points 3 points  Count back from 20 0 points 0 points  Months in reverse 0 points 0 points  Repeat phrase 0 points 4 points  Total Score 0 7    Immunization History  Administered Date(s) Administered  . Influenza, High Dose Seasonal PF 11/21/2016, 09/09/2017, 08/21/2018  . Pneumococcal Conjugate-13 12/26/2016  . Pneumococcal Polysaccharide-23 03/12/2018  . Tdap 01/10/2016    Qualifies for Shingles Vaccine? Yes . Due for Shingrix. Education has been provided regarding the importance of this vaccine. Pt has been advised to call insurance company to determine out of pocket expense. Advised may also receive vaccine at local pharmacy or Health Dept. Verbalized acceptance and understanding.  Tdap: Up to date  Flu Vaccine: Due for Flu vaccine. Does the patient want to receive this vaccine today?  No . Education has been provided  regarding the importance of this vaccine but still declined. Advised may receive this vaccine at local pharmacy or Health Dept. Aware to provide a copy of the vaccination record if obtained from local pharmacy or Health Dept. Verbalized acceptance and understanding.  Pneumococcal Vaccine: Up to date   Screening Tests Health Maintenance  Topic Date Due  . INFLUENZA VACCINE  05/15/2019  . COLONOSCOPY  07/07/2024  . TETANUS/TDAP  01/09/2026  . Hepatitis C Screening  Completed  . PNA vac Low Risk Adult  Completed   Cancer Screenings:  Colorectal Screening: Completed 07/07/14. Repeat every 10 years  Lung Cancer Screening: (Low Dose CT Chest recommended if Age 50-80 years, 30 pack-year currently smoking OR have quit w/in 15years.) does not qualify.   Additional Screening:  Hepatitis C Screening: does qualify; Completed 06/23/17  Vision Screening: Recommended annual ophthalmology exams for early detection of glaucoma and other disorders of the eye. Is the patient up to date with their annual eye exam?  No  Who is the provider or what is the name of the office in which the pt attends annual eye exams? [place] Screening: Recommended annual dental exams for proper oral hygiene  Community Resource Referral:  CRR required this visit?  No       Plan:    I have personally reviewed and addressed the Medicare Annual Wellness questionnaire and have noted the following in the patient's chart:  A. Medical and social history B. Use of alcohol, tobacco or illicit drugs  C. Current medications and supplements D. Functional ability and status E.  Nutritional status F.  Physical activity G. Advance directives H. List of other physicians I.  Hospitalizations, surgeries, and ER visits in previous 12 months J.  [place] such as hearing and vision if needed, cognitive and depression L. Referrals and appointments   In addition, I have reviewed and discussed with  patient certain preventive protocols, quality metrics, and best practice recommendations. A written personalized care plan for preventive services as well as general preventive health recommendations were provided to patient.   Signed,  Clemetine Marker, LPN Nurse Health Advisor   Nurse Notes: pt advised of results from hip xray and aware he will be referred to ortho. He states pain is typically only when he goes to stand up and taking tylenol PRN.

## 2019-07-07 ENCOUNTER — Other Ambulatory Visit: Payer: Self-pay

## 2019-07-07 ENCOUNTER — Inpatient Hospital Stay: Admission: RE | Admit: 2019-07-07 | Payer: Medicare HMO | Source: Ambulatory Visit

## 2019-07-07 DIAGNOSIS — R945 Abnormal results of liver function studies: Secondary | ICD-10-CM

## 2019-07-07 DIAGNOSIS — R7989 Other specified abnormal findings of blood chemistry: Secondary | ICD-10-CM

## 2019-07-07 MED ORDER — DOXAZOSIN MESYLATE 1 MG PO TABS: 1.0000 mg | ORAL_TABLET | Freq: Every day | ORAL | 0 refills | Status: DC

## 2019-07-08 ENCOUNTER — Ambulatory Visit
Admission: RE | Admit: 2019-07-08 | Discharge: 2019-07-08 | Disposition: A | Discharge status: Home / Self Care | Payer: Medicare HMO | Source: Ambulatory Visit | Attending: Family Medicine | Admitting: Family Medicine

## 2019-07-08 ENCOUNTER — Other Ambulatory Visit: Payer: Self-pay

## 2019-07-08 DIAGNOSIS — Z79890 Hormone replacement therapy (postmenopausal): Secondary | ICD-10-CM | POA: Insufficient documentation

## 2019-07-08 DIAGNOSIS — M8580 Other specified disorders of bone density and structure, unspecified site: Secondary | ICD-10-CM | POA: Diagnosis not present

## 2019-07-08 DIAGNOSIS — E213 Hyperparathyroidism, unspecified: Secondary | ICD-10-CM | POA: Insufficient documentation

## 2019-07-08 DIAGNOSIS — E559 Vitamin D deficiency, unspecified: Secondary | ICD-10-CM | POA: Diagnosis not present

## 2019-07-08 DIAGNOSIS — N184 Chronic kidney disease, stage 4 (severe): Secondary | ICD-10-CM | POA: Insufficient documentation

## 2019-07-08 DIAGNOSIS — Z13820 Encounter for screening for osteoporosis: Secondary | ICD-10-CM | POA: Insufficient documentation

## 2019-07-08 DIAGNOSIS — Z7901 Long term (current) use of anticoagulants: Secondary | ICD-10-CM | POA: Diagnosis not present

## 2019-07-08 DIAGNOSIS — N2581 Secondary hyperparathyroidism of renal origin: Secondary | ICD-10-CM | POA: Insufficient documentation

## 2019-07-08 DIAGNOSIS — Z79899 Other long term (current) drug therapy: Secondary | ICD-10-CM | POA: Diagnosis not present

## 2019-07-29 ENCOUNTER — Other Ambulatory Visit: Payer: Self-pay | Admitting: Family Medicine

## 2019-07-29 DIAGNOSIS — M8589 Other specified disorders of bone density and structure, multiple sites: Secondary | ICD-10-CM

## 2019-07-29 NOTE — Progress Notes (Signed)
Osteopenia multiple sites on recent Dexa  Consulted Dr. Ancil Boozer and she advised the following labs-  "vitamin D, pth without calcium, comp panel, CBC and TSH ( ordered by I don't see results) for males you may also want to check testosterone "  Labs ordered for Dx of osteopenia  Will have pt come in for labs prior to next OV.

## 2019-08-02 ENCOUNTER — Ambulatory Visit (INDEPENDENT_AMBULATORY_CARE_PROVIDER_SITE_OTHER): Payer: Medicare HMO

## 2019-08-02 ENCOUNTER — Other Ambulatory Visit: Payer: Self-pay

## 2019-08-02 DIAGNOSIS — Z23 Encounter for immunization: Secondary | ICD-10-CM | POA: Diagnosis not present

## 2019-08-03 LAB — CBC WITH DIFFERENTIAL/PLATELET
Absolute Monocytes: 358 cells/uL (ref 200–950)
Basophils Absolute: 51 cells/uL (ref 0–200)
Basophils Relative: 0.8 %
Eosinophils Absolute: 179 cells/uL (ref 15–500)
Eosinophils Relative: 2.8 %
HCT: 38.2 % — ABNORMAL LOW (ref 38.5–50.0)
Hemoglobin: 12.7 g/dL — ABNORMAL LOW (ref 13.2–17.1)
Lymphs Abs: 1382 cells/uL (ref 850–3900)
MCH: 33.2 pg — ABNORMAL HIGH (ref 27.0–33.0)
MCHC: 33.2 g/dL (ref 32.0–36.0)
MCV: 100 fL (ref 80.0–100.0)
MPV: 12.9 fL — ABNORMAL HIGH (ref 7.5–12.5)
Monocytes Relative: 5.6 %
Neutro Abs: 4429 cells/uL (ref 1500–7800)
Neutrophils Relative %: 69.2 %
Platelets: 144 10*3/uL (ref 140–400)
RBC: 3.82 10*6/uL — ABNORMAL LOW (ref 4.20–5.80)
RDW: 13.1 % (ref 11.0–15.0)
Total Lymphocyte: 21.6 %
WBC: 6.4 10*3/uL (ref 3.8–10.8)

## 2019-08-03 LAB — TSH: TSH: 0.73 mIU/L (ref 0.40–4.50)

## 2019-08-03 LAB — COMPLETE METABOLIC PANEL WITH GFR
AG Ratio: 1.4 (calc) (ref 1.0–2.5)
ALT: 15 U/L (ref 9–46)
AST: 26 U/L (ref 10–35)
Albumin: 3.7 g/dL (ref 3.6–5.1)
Alkaline phosphatase (APISO): 76 U/L (ref 35–144)
BUN/Creatinine Ratio: 14 (calc) (ref 6–22)
BUN: 29 mg/dL — ABNORMAL HIGH (ref 7–25)
CO2: 26 mmol/L (ref 20–32)
Calcium: 9.2 mg/dL (ref 8.6–10.3)
Chloride: 110 mmol/L (ref 98–110)
Creat: 2.02 mg/dL — ABNORMAL HIGH (ref 0.70–1.25)
GFR, Est African American: 38 mL/min/{1.73_m2} — ABNORMAL LOW (ref >=60)
GFR, Est Non African American: 33 mL/min/{1.73_m2} — ABNORMAL LOW (ref >=60)
Globulin: 2.7 g/dL (calc) (ref 1.9–3.7)
Glucose, Bld: 85 mg/dL (ref 65–99)
Potassium: 5.3 mmol/L (ref 3.5–5.3)
Sodium: 142 mmol/L (ref 135–146)
Total Bilirubin: 0.5 mg/dL (ref 0.2–1.2)
Total Protein: 6.4 g/dL (ref 6.1–8.1)

## 2019-08-03 LAB — VITAMIN D 25 HYDROXY (VIT D DEFICIENCY, FRACTURES): Vit D, 25-Hydroxy: 58 ng/mL (ref 30–100)

## 2019-08-03 LAB — TESTOSTERONE: Testosterone: 601 ng/dL (ref 250–827)

## 2019-08-03 LAB — PARATHYROID HORMONE, INTACT (NO CA): PTH: 59 pg/mL (ref 14–64)

## 2019-09-17 ENCOUNTER — Other Ambulatory Visit: Payer: Self-pay

## 2019-09-17 ENCOUNTER — Ambulatory Visit (INDEPENDENT_AMBULATORY_CARE_PROVIDER_SITE_OTHER): Payer: Medicare HMO | Admitting: Family Medicine

## 2019-09-17 ENCOUNTER — Encounter: Payer: Self-pay | Admitting: Family Medicine

## 2019-09-17 VITALS — BP 116/62 | HR 90 | Temp 97.6°F | Resp 14 | Ht 76.0 in | Wt 210.8 lb

## 2019-09-17 DIAGNOSIS — E559 Vitamin D deficiency, unspecified: Secondary | ICD-10-CM

## 2019-09-17 DIAGNOSIS — E890 Postprocedural hypothyroidism: Secondary | ICD-10-CM

## 2019-09-17 DIAGNOSIS — I10 Essential (primary) hypertension: Secondary | ICD-10-CM | POA: Diagnosis not present

## 2019-09-17 DIAGNOSIS — F988 Other specified behavioral and emotional disorders with onset usually occurring in childhood and adolescence: Secondary | ICD-10-CM

## 2019-09-17 DIAGNOSIS — Z8739 Personal history of other diseases of the musculoskeletal system and connective tissue: Secondary | ICD-10-CM

## 2019-09-17 DIAGNOSIS — M8589 Other specified disorders of bone density and structure, multiple sites: Secondary | ICD-10-CM

## 2019-09-17 DIAGNOSIS — G8194 Hemiplegia, unspecified affecting left nondominant side: Secondary | ICD-10-CM

## 2019-09-17 DIAGNOSIS — F0631 Mood disorder due to known physiological condition with depressive features: Secondary | ICD-10-CM

## 2019-09-17 DIAGNOSIS — G4700 Insomnia, unspecified: Secondary | ICD-10-CM

## 2019-09-17 DIAGNOSIS — N184 Chronic kidney disease, stage 4 (severe): Secondary | ICD-10-CM

## 2019-09-17 DIAGNOSIS — R945 Abnormal results of liver function studies: Secondary | ICD-10-CM

## 2019-09-17 DIAGNOSIS — E782 Mixed hyperlipidemia: Secondary | ICD-10-CM

## 2019-09-17 DIAGNOSIS — I69398 Other sequelae of cerebral infarction: Secondary | ICD-10-CM

## 2019-09-17 DIAGNOSIS — R7989 Other specified abnormal findings of blood chemistry: Secondary | ICD-10-CM

## 2019-09-17 MED ORDER — LEVOTHYROXINE SODIUM 88 MCG PO TABS: 88.0000 ug | ORAL_TABLET | Freq: Every day | ORAL | 1 refills | Status: DC

## 2019-09-17 MED ORDER — MIRTAZAPINE 15 MG PO TABS: 15.0000 mg | ORAL_TABLET | Freq: Every day | ORAL | 1 refills | Status: DC

## 2019-09-17 MED ORDER — METHYLPHENIDATE HCL 5 MG PO TABS: 5.0000 mg | ORAL_TABLET | Freq: Every day | ORAL | 0 refills | Status: DC

## 2019-09-17 MED ORDER — LOSARTAN POTASSIUM 25 MG PO TABS: 25.0000 mg | ORAL_TABLET | Freq: Every day | ORAL | 1 refills | Status: DC

## 2019-09-17 MED ORDER — PRAVASTATIN SODIUM 40 MG PO TABS: 40.0000 mg | ORAL_TABLET | Freq: Every day | ORAL | 1 refills | Status: DC

## 2019-09-17 MED ORDER — FLUOXETINE HCL 20 MG PO TABS: 20.0000 mg | ORAL_TABLET | Freq: Every day | ORAL | 1 refills | Status: DC

## 2019-09-17 MED ORDER — DOXAZOSIN MESYLATE 1 MG PO TABS: 1.0000 mg | ORAL_TABLET | Freq: Every day | ORAL | 1 refills | Status: DC

## 2019-09-17 NOTE — Progress Notes (Signed)
Name: Phinehas Grounds   MRN: [id number]    DOB: May 23, 1951   Date:09/17/2019       Progress Note  Chief Complaint  Patient presents with  . Follow-up  . Medication Refill  . Hypothyroidism  . Hypertension  . Hyperlipidemia     Subjective:   Deegan Valentino is a 68 y.o. male, presents to clinic for routine follow up on the conditions listed above.   Pt presents today to review bone density and labs with new dx of osteopenia. [facility] appt coming up  Reviewed Dexa and bone fx risk scores and fall risk screening Fall risk at home:  Three steps going down side door of house has railings   Good lighting, railings, no loose cords, rugs or small pets  Home is really well set up for avoiding falls.  No gait abnormalities no one sided weakness, pt has gotten a lot of function back after stroke, he is very active Not on Vit D or calcium, labs were reviewed and normal.  Hypertension:  Currently managed on losartan 25 mg Pt reports good med compliance and denies any SE.  No lightheadedness, hypotension, syncope. Blood pressure today is well controlled. BP Readings from Last 3 Encounters:  09/17/19 116/62  06/17/19 134/80  06/11/19 (!) 104/58  Pt denies CP, SOB, exertional sx, LE edema, palpitation, Ha's, visual disturbances  Hyperlipidemia: Current Medication Regimen:  Pravastatin 40 mg daily  Last Lipids: Lab Results  Component Value Date   CHOL 196 03/12/2019   HDL 36 (L) 03/12/2019   LDLCALC 132 (H) 03/12/2019   TRIG 150 (H) 03/12/2019   CHOLHDL 5.4 (H) 03/12/2019  - Current Diet:  Generally healthy - Denies: Chest pain, shortness of breath, myalgias. - Documented aortic atherosclerosis? Yes - Risk factors for atherosclerosis: cerebral vascular disease, hypercholesterolemia and hypertension   Sleep/mood/attention: Requests refills of remeron, prozac ritalin, all still helping with attention, sleep, mood, no concerns about insomnia, aggitations,  inattentiveness, his memory and cognitive function has been unchanged and his wife states he is at his baseline.  No med SE or concerns.  Denies rapid heart rate, elevated blood pressures, agitation, dry mouth, weight loss, near syncope, fatigue confusion   Patient Active Problem List   Diagnosis Date Noted  . Episode of recurrent major depressive disorder ([place]) 06/11/2019  . Aortic ectasia, abdominal ([place]) 05/22/2018  . Hyperparathyroidism, secondary renal ([place]) 10/02/2017  . Bursitis of shoulder 06/23/2017  . Controlled substance agreement signed 05/23/2017  . On stimulant medication 02/22/2017  . Chronic low back pain without sciatica 08/21/2016  . Low libido 05/23/2016  . Colon polyp 11/27/2015  . Vitamin D deficiency 09/04/2015  . 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 Anemia, chronic renal failure 06/09/2015  . Chronic kidney disease, stage IV (severe) ([place])   . Hypertension   . Chronic gout due to renal impairment   . Hyperlipidemia   . Hypothyroidism following radioiodine therapy   . Restrictive lung disease   . A-fib ([place])   . History of gout 02/28/2014  . Dysphagia following cerebral infarction 02/21/2014  . History of arterial ischemic stroke 02/21/2014  . Left hemiparesis ([place]) 02/11/2014    Past Surgical History:  Procedure Laterality Date  . CATARACT EXTRACTION W/PHACO Right 08/27/2016   Procedure: CATARACT EXTRACTION PHACO AND INTRAOCULAR LENS PLACEMENT (IOC);  Surgeon: Eulogio Bear, MD;  Location: [place];  Service: Ophthalmology;  Laterality: Right;  RIGHT  . ENTEROSTOMY CLOSURE  11/27/15   ileostomy  takedown  . EXPLORATORY LAPAROTOMY  11/27/15  . illeostomy  07/2014  . POLYPECTOMY  10/15   GI surgery to remove polyp    Family History  Problem Relation Age of Onset  . Arthritis Mother   . Heart disease Mother   . Heart attack Father   . Heart disease Father   . Cirrhosis Sister   . Hyperparathyroidism Neg Hx     Social History    Socioeconomic History  . Marital status: Married    Spouse name: Curt Bears  . Number of children: 2  . Years of education: Not on file  . Highest education level: 12th grade  Occupational History  . Occupation: Retired  Scientific laboratory technician  . Financial resource strain: Not hard at all  . Food insecurity    Worry: Never true    Inability: Never true  . Transportation needs    Medical: No    Non-medical: No  Tobacco Use  . Smoking status: Never Smoker  . Smokeless tobacco: Never Used  . Tobacco comment: smoking cessation materials not required  Substance and Sexual Activity  . Alcohol use: No  . Drug use: No  . Sexual activity: Not Currently  Lifestyle  . Physical activity    Days per week: 0 days    Minutes per session: 0 min  . Stress: Only a little  Relationships  . Social Herbalist on phone: Patient refused    Gets together: Patient refused    Attends religious service: Patient refused    Active member of club or organization: Patient refused    Attends meetings of clubs or organizations: Patient refused    Relationship status: Married  . Intimate partner violence    Fear of current or ex partner: No    Emotionally abused: No    Physically abused: No    Forced sexual activity: No  Other Topics Concern  . Not on file  Social History Narrative  . Not on file     Current Outpatient Medications:  .  allopurinol (ZYLOPRIM) 300 MG tablet, Take 300 mg by mouth daily., Disp: , Rfl:  .  apixaban (ELIQUIS) 5 MG TABS tablet, Take 1 tablet (5 mg total) by mouth 2 (two) times daily., Disp: 180 tablet, Rfl: 3 .  calcitRIOL (ROCALTROL) 0.25 MCG capsule, Take 0.25 mcg daily by mouth. , Disp: , Rfl:  .  Cholecalciferol (VITAMIN D3) 2000 units capsule, Take 2,000 Units by mouth daily., Disp: , Rfl:  .  cyanocobalamin 500 MCG tablet, Take 500 mcg by mouth daily., Disp: , Rfl:  .  doxazosin (CARDURA) 1 MG tablet, Take 1 tablet (1 mg total) by mouth daily., Disp: 90 tablet,  Rfl: 1 .  famotidine (PEPCID) 20 MG tablet, Take 1 tablet by mouth twice daily, Disp: 180 tablet, Rfl: 0 .  ferrous sulfate 325 (65 FE) MG tablet, Take 325 mg by mouth 3 (three) times daily with meals. , Disp: , Rfl:  .  FLUoxetine (PROZAC) 20 MG tablet, Take 1 tablet (20 mg total) by mouth daily., Disp: 90 tablet, Rfl: 1 .  levothyroxine (SYNTHROID) 88 MCG tablet, Take 1 tablet (88 mcg total) by mouth daily., Disp: 90 tablet, Rfl: 1 .  losartan (COZAAR) 25 MG tablet, Take 1 tablet (25 mg total) by mouth daily., Disp: 90 tablet, Rfl: 1 .  methylphenidate (RITALIN) 5 MG tablet, Take 1 tablet (5 mg total) by mouth daily. If needed for attention or focus, Disp: 30 tablet, Rfl: 0 .  mirtazapine (REMERON) 15 MG tablet, Take 1 tablet (15 mg total) by mouth at bedtime., Disp: 90 tablet, Rfl: 1 .  pravastatin (PRAVACHOL) 40 MG tablet, Take 1 tablet (40 mg total) by mouth daily., Disp: 90 tablet, Rfl: 1 .  [START ON 10/18/2019] methylphenidate (RITALIN) 5 MG tablet, Take 1 tablet (5 mg total) by mouth daily., Disp: 30 tablet, Rfl: 0 .  [START ON 11/18/2019] methylphenidate (RITALIN) 5 MG tablet, Take 1 tablet (5 mg total) by mouth daily., Disp: 30 tablet, Rfl: 0  Allergies  Allergen Reactions  . Bactrim [Sulfamethoxazole-Trimethoprim] Other (See Comments)    Hyperkalemia  . Chlorpromazine Other (See Comments)    Hiccups, hallucinations  . Cyclobenzaprine Other (See Comments)    Sleeps for 8 hrs after taking it  . Oxycodone Other (See Comments)    Excessive sleepiness   I personally reviewed active problem list, medication list, allergies, family history, social history, health maintenance, notes from last encounter, lab results, imaging with the patient/caregiver today.  Review of Systems  Constitutional: Negative.   HENT: Negative.   Eyes: Negative.   Respiratory: Negative.   Cardiovascular: Negative.   Gastrointestinal: Negative.   Endocrine: Negative.   Genitourinary: Negative.    Musculoskeletal: Negative.   Skin: Negative.   Allergic/Immunologic: Negative.   Neurological: Negative.   Hematological: Negative.   Psychiatric/Behavioral: Negative.   All other systems reviewed and are negative.    Objective:    Vitals:   09/17/19 1112  BP: 116/62  Pulse: 90  Resp: 14  Temp: 97.6 F (36.4 C)  SpO2: 99%  Weight: 210 lb 12.8 oz (95.6 kg)  Height: 6\' 4"  (1.93 m)    Body mass index is 25.66 kg/m.  Physical Exam Vitals signs and nursing note reviewed.  Constitutional:      General: He is not in acute distress.    Appearance: Normal appearance. He is well-developed. He is not ill-appearing, toxic-appearing or diaphoretic.     Interventions: Face mask in place.  HENT:     Head: Normocephalic and atraumatic.     Jaw: No trismus.     Right Ear: External ear normal.     Left Ear: External ear normal.  Eyes:     General: Lids are normal. No scleral icterus.    Conjunctiva/sclera: Conjunctivae normal.     Pupils: Pupils are equal, round, and reactive to light.  Neck:     Musculoskeletal: Normal range of motion and neck supple.     Trachea: Trachea and phonation normal. No tracheal deviation.  Cardiovascular:     Rate and Rhythm: Normal rate and regular rhythm.     Pulses: Normal pulses.          Radial pulses are 2+ on the right side and 2+ on the left side.       Posterior tibial pulses are 2+ on the right side and 2+ on the left side.     Heart sounds: Normal heart sounds. No murmur. No friction rub. No gallop.   Pulmonary:     Effort: Pulmonary effort is normal. No respiratory distress.     Breath sounds: Normal breath sounds. No stridor. No wheezing, rhonchi or rales.  Abdominal:     General: Bowel sounds are normal. There is no distension.     Palpations: Abdomen is soft.     Tenderness: There is no abdominal tenderness. There is no guarding or rebound.  Musculoskeletal: Normal range of motion.     Right lower leg: No edema.  Left lower  leg: No edema.  Skin:    General: Skin is warm and dry.     Capillary Refill: Capillary refill takes less than 2 seconds.     Coloration: Skin is not jaundiced.     Findings: No rash.     Nails: There is no clubbing.   Neurological:     Mental Status: He is alert. Mental status is at baseline.     Cranial Nerves: No facial asymmetry.     Motor: No tremor or abnormal muscle tone.     Gait: Gait normal.  Psychiatric:        Mood and Affect: Mood normal.        Speech: Speech normal.        Behavior: Behavior normal. Behavior is cooperative.      Recent Results (from the past 2160 hour(s))  Parathyroid hormone, intact (no Ca)     Status: None   Collection Time: 08/02/19 12:00 AM  Result Value Ref Range   PTH 59 14 - 64 pg/mL    Comment: . Interpretive Guide    Intact PTH           Calcium ------------------    ----------           ------- Normal Parathyroid    Normal               Normal Hypoparathyroidism    Low or Low Normal    Low Hyperparathyroidism    Primary            Normal or High       High    Secondary          High                 Normal or Low    Tertiary           High                 High Non-Parathyroid    Hypercalcemia      Low or Low Normal    High .   COMPLETE METABOLIC PANEL WITH GFR     Status: Abnormal   Collection Time: 08/02/19 12:00 AM  Result Value Ref Range   Glucose, Bld 85 65 - 99 mg/dL    Comment: .            Fasting reference interval .    BUN 29 (H) 7 - 25 mg/dL   Creat 2.02 (H) 0.70 - 1.25 mg/dL    Comment: For patients >52 years of age, the reference limit for Creatinine is approximately 13% higher for people identified as African-American. .    GFR, Est Non African American 33 (L) > OR = 60 mL/min/1.25m2   GFR, Est African American 38 (L) > OR = 60 mL/min/1.49m2   BUN/Creatinine Ratio 14 6 - 22 (calc)   Sodium 142 135 - 146 mmol/L   Potassium 5.3 3.5 - 5.3 mmol/L   Chloride 110 98 - 110 mmol/L   CO2 26 20 - 32 mmol/L    Calcium 9.2 8.6 - 10.3 mg/dL   Total Protein 6.4 6.1 - 8.1 g/dL   Albumin 3.7 3.6 - 5.1 g/dL   Globulin 2.7 1.9 - 3.7 g/dL (calc)   AG Ratio 1.4 1.0 - 2.5 (calc)   Total Bilirubin 0.5 0.2 - 1.2 mg/dL   Alkaline phosphatase (APISO) 76 35 - 144 U/L   AST 26 10 - 35 U/L  ALT 15 9 - 46 U/L  TSH     Status: None   Collection Time: 08/02/19 12:00 AM  Result Value Ref Range   TSH 0.73 0.40 - 4.50 mIU/L  VITAMIN D 25 Hydroxy (Vit-D Deficiency, Fractures)     Status: None   Collection Time: 08/02/19 12:00 AM  Result Value Ref Range   Vit D, 25-Hydroxy 58 30 - 100 ng/mL    Comment: Vitamin D Status         25-OH Vitamin D: . Deficiency:                    <20 ng/mL Insufficiency:             20 - 29 ng/mL Optimal:                 > or = 30 ng/mL . For 25-OH Vitamin D testing on patients on  D2-supplementation and patients for whom quantitation  of D2 and D3 fractions is required, the [facility](TM) 25-OH VIT D, (D2,D3), LC/MS/MS is recommended: order  code [phone] (patients >61yrs). See Note 1 . Note 1 . For additional information, please refer to  http://education.[facility].com/faq/FAQ199  (This link is being provided for informational/ educational purposes only.)   CBC with Differential/Platelet     Status: Abnormal   Collection Time: 08/02/19 12:00 AM  Result Value Ref Range   WBC 6.4 3.8 - 10.8 Thousand/uL   RBC 3.82 (L) 4.20 - 5.80 Million/uL   Hemoglobin 12.7 (L) 13.2 - 17.1 g/dL   HCT 38.2 (L) 38.5 - 50.0 %   MCV 100.0 80.0 - 100.0 fL   MCH 33.2 (H) 27.0 - 33.0 pg   MCHC 33.2 32.0 - 36.0 g/dL   RDW 13.1 11.0 - 15.0 %   Platelets 144 140 - 400 Thousand/uL   MPV 12.9 (H) 7.5 - 12.5 fL   Neutro Abs 4,429 1,500 - 7,800 cells/uL   Lymphs Abs 1,382 850 - 3,900 cells/uL   Absolute Monocytes 358 200 - 950 cells/uL   Eosinophils Absolute 179 15 - 500 cells/uL   Basophils Absolute 51 0 - 200 cells/uL   Neutrophils Relative % 69.2 %   Total Lymphocyte 21.6 %    Monocytes Relative 5.6 %   Eosinophils Relative 2.8 %   Basophils Relative 0.8 %  Testosterone     Status: None   Collection Time: 08/02/19 12:00 AM  Result Value Ref Range   Testosterone 601 250 - 827 ng/dL    PHQ2/9: Depression screen [facility] 2/9 09/17/2019 06/17/2019 06/11/2019 03/12/2019 12/10/2018  Decreased Interest 0 0 0 0 0  Down, Depressed, Hopeless 0 0 0 0 0  PHQ - 2 Score 0 0 0 0 0  Altered sleeping 0 - 0 0 0  Tired, decreased energy 0 - 0 0 0  Change in appetite 0 - 0 0 0  Feeling bad or failure about yourself  0 - 0 0 0  Trouble concentrating 0 - 0 0 0  Moving slowly or fidgety/restless 0 - 0 0 0  Suicidal thoughts 0 - 0 0 0  PHQ-9 Score 0 - 0 0 0  Difficult doing work/chores Not difficult at all - Not difficult at all Not difficult at all Not difficult at all  Some recent data might be hidden    phq 9 is negative Reviewed today  Fall Risk: Fall Risk  09/17/2019 06/17/2019 06/11/2019 03/12/2019 12/10/2018  Falls in the past year? 0 0 0 0 0  Number falls in past yr: 0 0 0 - -  Injury with Fall? 0 0 0 - -  Risk for fall due to : - - - - -  Follow up - Falls prevention discussed - - -   Functional Status Survey: Is the patient deaf or have difficulty hearing?: No Does the patient have difficulty seeing, even when wearing glasses/contacts?: No Does the patient have difficulty concentrating, remembering, or making decisions?: No Does the patient have difficulty walking or climbing stairs?: No Does the patient have difficulty dressing or bathing?: No Does the patient have difficulty doing errands alone such as visiting a doctor's office or shopping?: No   Assessment & Plan:    1. Osteopenia of multiple sites Spent the majority of the visit discussing patient's recent DEXA bone scan results including T score, location of osteopenia, treatment and supportive measures for osteopenia, his risk factors of having a major osteoporosis related fracture or hip fracture risk in the next  10 years.  We discussed fall risk and measures they can take to reduce falls however their house is very safe they have railings everywhere only one set of stairs, walk-in shower, no loose cords rugs small pets in patient had adjusted to all of these things in the past after his stroke.  His gait and strength are pretty good he is not having any problems with near fall episodes no recent falls.  He has not previously been on vitamin D and calcium and we discussed his recent labs including TSH, parathyroid hormone, vitamin D, calcium which were all good and normal  RECOMMENDATIONS: 1. All patients should optimize calcium and vitamin D intake. 2. Consider FDA-approved medical therapies in postmenopausal women and men aged 37 years and older, based on the following: a. A hip or vertebral (clinical or morphometric) fracture b. T-score < -2.5 at the femoral neck or spine after appropriate evaluation to exclude secondary causes c. Low bone mass (T-score between -1.0 and -2.5 at the femoral neck or spine) and a 10-year probability of a hip fracture > 3% or a 10-year probability of a major osteoporosis-related fracture > 20% based on the [place]-adapted WHO algorithm d. Clinician judgment and/or patient preferences may indicate treatment for people with 10-year fracture probabilities above or below these levels  Handout with information about drug options, printout of his DEXA scan, information on osteopenia was all given to him and his wife and currently they are going to do daily calcium vitamin D supplementation weightbearing exercises and conservative measures, if they wish to proceed with any drugs they will let me know and we will address it at that time.  For now Vit D, Calcium - recheck at next f/up visit, Bone density repeat in 2 years    2. Essential hypertension Well controlled - losartan (COZAAR) 25 MG tablet; Take 1 tablet (25 mg total) by mouth daily.  Dispense: 90 tablet; Refill: 1  3.  Hypothyroidism following radioiodine therapy TSH done Oct normal - levothyroxine (SYNTHROID) 88 MCG tablet; Take 1 tablet (88 mcg total) by mouth daily.  Dispense: 90 tablet; Refill: 1  4. Left hemiparesis ([place]) Pt endorses good steady gate, mostly dealing with the emotional and cognitive changes still  5. Mixed hyperlipidemia Due for recheck of labs, no myalgias, compliant with meds - Lipid Panel - CMP w GFR - pravastatin (PRAVACHOL) 40 MG tablet; Take 1 tablet (40 mg total) by mouth daily.  Dispense: 90 tablet; Refill: 1  6. Chronic kidney disease, stage IV (  severe) ([place]) Per Dr. Holley Raring  7. Vitamin D deficiency Last labs good  8. 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  Dispense: 90 tablet; Refill: 1  9. Abnormal LFTs Needs repeat labs  10. Attention deficit disorder (ADD) without hyperactivity Still using most days, no CP, elevated BP, weight loss, mood swings, tachycardia, refills requested - methylphenidate (RITALIN) 5 MG tablet; Take 1 tablet (5 mg total) by mouth daily. If needed for attention or focus  Dispense: 30 tablet; Refill: 0 - methylphenidate (RITALIN) 5 MG tablet; Take 1 tablet (5 mg total) by mouth daily.  Dispense: 30 tablet; Refill: 0 - methylphenidate (RITALIN) 5 MG tablet; Take 1 tablet (5 mg total) by mouth daily.  Dispense: 30 tablet; Refill: 0  11. Insomnia, unspecified type Refill request - stable - mirtazapine (REMERON) 15 MG tablet; Take 1 tablet (15 mg total) by mouth at bedtime.  Dispense: 90 tablet; Refill: 1  12. History of gout Per nephrologist/Dr. Holley Raring   Return for 6 month Routine follow-up hld, htn.   Delsa Grana, PA-C 09/17/19 12:05 PM

## 2019-09-17 NOTE — Patient Instructions (Addendum)
We have received your Bone Density Results.   You are now in the osteopenic category. This means that your bones are weakened, but you do not have osteoporosis at this time.   Continue taking Calcium 1200mg  and Vitamin D 1000IU daily. Continue weight bearing exercises, exercises that make you work against gravity while staying upright such as walking, running or swimming to help prevent bone density loss.   WE can repeat the bone study in two years.  If you want to start meds there is info below about fosamax. We can do lower doses or spaced out dosing.   Osteopenia  Osteopenia is a loss of thickness (density) inside of the bones. Another name for osteopenia is low bone mass. Mild osteopenia is a normal part of aging. It is not a disease, and it does not cause symptoms. However, if you have osteopenia and continue to lose bone mass, you could develop a condition that causes the bones to become thin and break more easily (osteoporosis). You may also lose some height, have back pain, and have a stooped posture. Although osteopenia is not a disease, making changes to your lifestyle and diet can help to prevent osteopenia from developing into osteoporosis. What are the causes? Osteopenia is caused by loss of calcium in the bones.  Bones are constantly changing. Old bone cells are continually being replaced with new bone cells. This process builds new bone. The mineral calcium is needed to build new bone and maintain bone density. Bone density is usually highest around age 61. After that, most people's bodies cannot replace all the bone they have lost with new bone. What increases the risk? You are more likely to develop this condition if:  You are older than age 56.  You are a woman who went through menopause early.  You have a long illness that keeps you in bed.  You do not get enough exercise.  You lack certain nutrients (malnutrition).  You have an overactive thyroid gland  (hyperthyroidism).  You smoke.  You drink a lot of alcohol.  You are taking medicines that weaken the bones, such as steroids. What are the signs or symptoms? This condition does not cause any symptoms. You may have a slightly higher risk for bone breaks (fractures), so getting fractures more easily than normal may be an indication of osteopenia. How is this diagnosed? Your health care provider can diagnose this condition with a special type of X-ray exam that measures bone density (dual-energy X-ray absorptiometry, DEXA). This test can measure bone density in your hips, spine, and wrists. Osteopenia has no symptoms, so this condition is usually diagnosed after a routine bone density screening test is done for osteoporosis. This routine screening is usually done for:  Women who are age 18 or older.  Men who are age 12 or older. If you have risk factors for osteopenia, you may have the screening test at an earlier age. How is this treated? Making dietary and lifestyle changes can lower your risk for osteoporosis. If you have severe osteopenia that is close to becoming osteoporosis, your health care provider may prescribe medicines and dietary supplements such as calcium and vitamin D. These supplements help to rebuild bone density. Follow these instructions at home:   Take over-the-counter and prescription medicines only as told by your health care provider. These include vitamins and supplements.  Eat a diet that is high in calcium and vitamin D. ? Calcium is found in dairy products, beans, salmon, and leafy green  vegetables like spinach and broccoli. ? Look for foods that have vitamin D and calcium added to them (fortified foods), such as orange juice, cereal, and bread.  Do 30 or more minutes of a weight-bearing exercise every day, such as walking, jogging, or playing a sport. These types of exercises strengthen the bones.  Take precautions at home to lower your risk of falling, such  as: ? Keeping rooms well-lit and free of clutter, such as cords. ? Installing safety rails on stairs. ? Using rubber mats in the bathroom or other areas that are often wet or slippery.  Do not use any products that contain nicotine or tobacco, such as cigarettes and e-cigarettes. If you need help quitting, ask your health care provider.  Avoid alcohol or limit alcohol intake to no more than 1 drink a day for nonpregnant women and 2 drinks a day for men. One drink equals 12 oz of beer, 5 oz of wine, or 1 oz of hard liquor.  Keep all follow-up visits as told by your health care provider. This is important. Contact a health care provider if:  You have not had a bone density screening for osteoporosis and you are: ? A woman, age 37 or older. ? A man, age 71 or older.  You are a postmenopausal woman who has not had a bone density screening for osteoporosis.  You are older than age 53 and you want to know if you should have bone density screening for osteoporosis. Summary  Osteopenia is a loss of thickness (density) inside of the bones. Another name for osteopenia is low bone mass.  Osteopenia is not a disease, but it may increase your risk for a condition that causes the bones to become thin and break more easily (osteoporosis).  You may be at risk for osteopenia if you are older than age 63 or if you are a woman who went through early menopause.  Osteopenia does not cause any symptoms, but it can be diagnosed with a bone density screening test.  Dietary and lifestyle changes are the first treatment for osteopenia. These may lower your risk for osteoporosis. This information is not intended to replace advice given to you by your health care provider. Make sure you discuss any questions you have with your health care provider. Document Released: 07/09/2017 Document Revised: 09/12/2017 Document Reviewed: 07/09/2017 Elsevier Patient Education  [employer].   Alendronate tablets  What is this medicine? ALENDRONATE (a LEN droe nate) slows calcium loss from bones. It helps to make normal healthy bone and to slow bone loss in people with Paget's disease and osteoporosis. It may be used in others at risk for bone loss. This medicine may be used for other purposes; ask your health care provider or pharmacist if you have questions. COMMON BRAND NAME(S): Fosamax What should I tell my health care provider before I take this medicine? They need to know if you have any of these conditions:  dental disease  esophagus, stomach, or intestine problems, like acid reflux or GERD  kidney disease  low blood calcium  low vitamin D  problems sitting or standing 30 minutes  trouble swallowing  an unusual or allergic reaction to alendronate, other medicines, foods, dyes, or preservatives  pregnant or trying to get pregnant  breast-feeding How should I use this medicine? You must take this medicine exactly as directed or you will lower the amount of the medicine you absorb into your body or you may cause yourself harm. Take this  medicine by mouth first thing in the morning, after you are up for the day. Do not eat or drink anything before you take your medicine. Swallow the tablet with a full glass (6 to 8 fluid ounces) of plain water. Do not take this medicine with any other drink. Do not chew or crush the tablet. After taking this medicine, do not eat breakfast, drink, or take any medicines or vitamins for at least 30 minutes. Sit or stand up for at least 30 minutes after you take this medicine; do not lie down. Do not take your medicine more often than directed. Talk to your pediatrician regarding the use of this medicine in children. Special care may be needed. Overdosage: If you think you have taken too much of this medicine contact a poison control center or emergency room at once. NOTE: This medicine is only for you. Do not share this medicine with others. What if I miss a  dose? If you miss a dose, do not take it later in the day. Continue your normal schedule starting the next morning. Do not take double or extra doses. What may interact with this medicine?  aluminum hydroxide  antacids  aspirin  calcium supplements  drugs for inflammation like ibuprofen, naproxen, and others  iron supplements  magnesium supplements  vitamins with minerals This list may not describe all possible interactions. Give your health care provider a list of all the medicines, herbs, non-prescription drugs, or dietary supplements you use. Also tell them if you smoke, drink alcohol, or use illegal drugs. Some items may interact with your medicine. What should I watch for while using this medicine? Visit your doctor or health care professional for regular checks ups. It may be some time before you see benefit from this medicine. Do not stop taking your medicine except on your doctor's advice. Your doctor or health care professional may order blood tests and other tests to see how you are doing. You should make sure you get enough calcium and vitamin D while you are taking this medicine, unless your doctor tells you not to. Discuss the foods you eat and the vitamins you take with your health care professional. Some people who take this medicine have severe bone, joint, and/or muscle pain. This medicine may also increase your risk for a broken thigh bone. Tell your doctor right away if you have pain in your upper leg or groin. Tell your doctor if you have any pain that does not go away or that gets worse. This medicine can make you more sensitive to the sun. If you get a rash while taking this medicine, sunlight may cause the rash to get worse. Keep out of the sun. If you cannot avoid being in the sun, wear protective clothing and use sunscreen. Do not use sun lamps or tanning beds/booths. What side effects may I notice from receiving this medicine? Side effects that you should report to  your doctor or health care professional as soon as possible:  allergic reactions like skin rash, itching or hives, swelling of the face, lips, or tongue  black or tarry stools  bone, muscle or joint pain  changes in vision  chest pain  heartburn or stomach pain  jaw pain, especially after dental work  pain or trouble when swallowing  redness, blistering, peeling or loosening of the skin, including inside the mouth Side effects that usually do not require medical attention (report to your doctor or health care professional if they continue or are  bothersome):  changes in taste  diarrhea or constipation  eye pain or itching  headache  nausea or vomiting  stomach gas or fullness This list may not describe all possible side effects. Call your doctor for medical advice about side effects. You may report side effects to FDA at 1-800-FDA-1088. Where should I keep my medicine? Keep out of the reach of children. Store at room temperature of 15 and 30 degrees C (59 and 86 degrees F). Throw away any unused medicine after the expiration date. NOTE: This sheet is a summary. It may not cover all possible information. If you have questions about this medicine, talk to your doctor, pharmacist, or health care provider.  2020 Elsevier/Gold Standard (2011-03-29 08:56:09)

## 2019-09-18 LAB — COMPLETE METABOLIC PANEL WITH GFR
AG Ratio: 1.5 (calc) (ref 1.0–2.5)
ALT: 19 U/L (ref 9–46)
AST: 25 U/L (ref 10–35)
Albumin: 3.8 g/dL (ref 3.6–5.1)
Alkaline phosphatase (APISO): 79 U/L (ref 35–144)
BUN/Creatinine Ratio: 16 (calc) (ref 6–22)
BUN: 35 mg/dL — ABNORMAL HIGH (ref 7–25)
CO2: 24 mmol/L (ref 20–32)
Calcium: 9.2 mg/dL (ref 8.6–10.3)
Chloride: 109 mmol/L (ref 98–110)
Creat: 2.24 mg/dL — ABNORMAL HIGH (ref 0.70–1.25)
GFR, Est African American: 34 mL/min/{1.73_m2} — ABNORMAL LOW (ref >=60)
GFR, Est Non African American: 29 mL/min/{1.73_m2} — ABNORMAL LOW (ref >=60)
Globulin: 2.6 g/dL (calc) (ref 1.9–3.7)
Glucose, Bld: 82 mg/dL (ref 65–99)
Potassium: 4.9 mmol/L (ref 3.5–5.3)
Sodium: 141 mmol/L (ref 135–146)
Total Bilirubin: 0.5 mg/dL (ref 0.2–1.2)
Total Protein: 6.4 g/dL (ref 6.1–8.1)

## 2019-09-18 LAB — LIPID PANEL
Cholesterol: 185 mg/dL (ref <200)
HDL: 35 mg/dL — ABNORMAL LOW (ref >=40)
LDL Cholesterol (Calc): 124 mg/dL (calc) — ABNORMAL HIGH
Non-HDL Cholesterol (Calc): 150 mg/dL (calc) — ABNORMAL HIGH (ref <130)
Total CHOL/HDL Ratio: 5.3 (calc) — ABNORMAL HIGH (ref <5.0)
Triglycerides: 146 mg/dL (ref <150)

## 2019-12-17 ENCOUNTER — Other Ambulatory Visit: Payer: Self-pay | Admitting: Family Medicine

## 2019-12-17 DIAGNOSIS — F988 Other specified behavioral and emotional disorders with onset usually occurring in childhood and adolescence: Secondary | ICD-10-CM

## 2019-12-17 MED ORDER — METHYLPHENIDATE HCL 5 MG PO TABS: 5.0000 mg | ORAL_TABLET | Freq: Every day | ORAL | 0 refills | Status: DC

## 2019-12-17 NOTE — Telephone Encounter (Signed)
Refill request for general medication.  Last office visit:09/17/19  No follow-ups on file.

## 2019-12-17 NOTE — Telephone Encounter (Signed)
Requested medication (s) are due for refill today: yes  Requested medication (s) are on the active medication list: yes  Last refill: 11/18/19  Future visit scheduled: yes  Notes to clinic:  not delegated    Requested Prescriptions  Pending Prescriptions Disp Refills   methylphenidate (RITALIN) 5 MG tablet 30 tablet 0    Sig: Take 1 tablet (5 mg total) by mouth daily. If needed for attention or focus      Not Delegated - Psychiatry:  Stimulants/ADHD Failed - 12/17/2019 10:37 AM      Failed - This refill cannot be delegated      Failed - Urine Drug Screen completed in last 360 days.      Failed - Valid encounter within last 3 months    Recent Outpatient Visits           3 months ago Osteopenia of multiple sites   [facility] Delsa Grana, PA-C   6 months ago Essential hypertension   [place], Bethel Born, NP   9 months ago Essential hypertension   [place], Bethel Born, NP   1 year ago Aortic ectasia, abdominal [facility])   [facility], Satira Anis, MD   1 year ago Need for influenza vaccination   [place], Satira Anis, MD       Future Appointments             In 3 months Delsa Grana, PA-C [facility] [place]   In 6 months  [facility], [facility]

## 2019-12-17 NOTE — Telephone Encounter (Signed)
Medication Refill - Medication: methylphenidate (RITALIN) 5 MG tablet    Has the patient contacted their pharmacy? Yes.   (Agent: If no, request that the patient contact the pharmacy for the refill.) (Agent: If yes, when and what did the pharmacy advise?)  Preferred Pharmacy (with phone number or street name): [email or web address] [email or web address]  Pt has 1 pill left    Agent: Please be advised that RX refills may take up to 3 business days. We ask that you follow-up with your pharmacy.

## 2020-01-17 ENCOUNTER — Ambulatory Visit: Payer: Self-pay

## 2020-01-17 NOTE — Telephone Encounter (Signed)
Called pt to check on them, Told them if cough gets worse and they need something for it or any SOB or difficulty breathing get checked out by urgent care or ER.  Told pt unfortunately we have no opens slots this week.

## 2020-01-17 NOTE — Telephone Encounter (Signed)
Patient wife called.  Patient gave verbal permission for me to speak with his wife.  She states that she has been sick with lose of taste and smell feeling bad since Wednesday. She has just been isolating and has not been tested for COVID-19. Over the weekend her husband came down with fever, sore throat,cough and last night diarrhea. She states he has no breathing issues. She does not know of any known exposure to anyone with COVID-19.  Call was place to office. No available appointments. Wife was given information for scheduling for COVID-19 testing and urged to call her own PCP to report her symptoms.  She was told that any SOB for either of them needs medical evaluation. Drink plenty of fluids rest and take OTC medications to help with symptoms.  She will discuss with patient options of seeking care at [facility] or ER.  She was urged to call ahead for instructions.  She verbalized understanding of all information.  Reason for Disposition . [1] COVID-19 infection suspected by caller or triager AND [2] mild symptoms (cough, fever, or others) AND [0] no complications or SOB  Answer Assessment - Initial Assessment Questions 1. COVID-19 DIAGNOSIS: "Who made your Coronavirus (COVID-19) diagnosis?" "Was it confirmed by a positive lab test?" If not diagnosed by a HCP, ask "Are there lots of cases (community spread) where you live?" (See public health department website, if unsure)     Unsure wife states she is sick 2. COVID-19 EXPOSURE: "Was there any known exposure to COVID before the symptoms began?" CDC Definition of close contact: within 6 feet (2 meters) for a total of 15 minutes or more over a 24-hour period.      unsure 3. ONSET: "When did the COVID-19 symptoms start?"      friday 4. WORST SYMPTOM: "What is your worst symptom?" (e.g., cough, fever, shortness of breath, muscle aches)     Cough, sore throat, fever diarrhea headache 5. COUGH: "Do you have a cough?" If so, ask: "How bad is the cough?"    Cough 6. FEVER: "Do you have a fever?" If so, ask: "What is your temperature, how was it measured, and when did it start?"     99.9 to 100 7. RESPIRATORY STATUS: "Describe your breathing?" (e.g., shortness of breath, wheezing, unable to speak)     None per wife report 8. BETTER-SAME-WORSE: "Are you getting better, staying the same or getting worse compared to yesterday?"  If getting worse, ask, "In what way?"     same 9. HIGH RISK DISEASE: "Do you have any chronic medical problems?" (e.g., asthma, heart or lung disease, weak immune system, obesity, etc.)     none 10. PREGNANCY: "Is there any chance you are pregnant?" "When was your last menstrual period?"      N/A 11. OTHER SYMPTOMS: "Do you have any other symptoms?"  (e.g., chills, fatigue, headache, loss of smell or taste, muscle pain, sore throat; new loss of smell or taste especially support the diagnosis of COVID-19)       Fatigue, sore throat  Protocols used: CORONAVIRUS (COVID-19) DIAGNOSED OR SUSPECTED-A-AH

## 2020-01-21 ENCOUNTER — Encounter: Payer: Self-pay | Admitting: Emergency Medicine

## 2020-01-21 ENCOUNTER — Other Ambulatory Visit: Payer: Self-pay

## 2020-01-21 ENCOUNTER — Emergency Department: Payer: Medicare HMO

## 2020-01-21 ENCOUNTER — Inpatient Hospital Stay
Admission: EM | Admit: 2020-01-21 | Discharge: 2020-01-25 | DRG: 177 | Disposition: A | Discharge status: Home / Self Care | Payer: Medicare HMO | Attending: Internal Medicine | Admitting: Internal Medicine

## 2020-01-21 ENCOUNTER — Inpatient Hospital Stay: Payer: Medicare HMO

## 2020-01-21 DIAGNOSIS — Z9841 Cataract extraction status, right eye: Secondary | ICD-10-CM | POA: Diagnosis not present

## 2020-01-21 DIAGNOSIS — N179 Acute kidney failure, unspecified: Secondary | ICD-10-CM

## 2020-01-21 DIAGNOSIS — D696 Thrombocytopenia, unspecified: Secondary | ICD-10-CM | POA: Diagnosis present

## 2020-01-21 DIAGNOSIS — Z8249 Family history of ischemic heart disease and other diseases of the circulatory system: Secondary | ICD-10-CM

## 2020-01-21 DIAGNOSIS — J1282 Pneumonia due to coronavirus disease 2019: Secondary | ICD-10-CM | POA: Diagnosis present

## 2020-01-21 DIAGNOSIS — Z8673 Personal history of transient ischemic attack (TIA), and cerebral infarction without residual deficits: Secondary | ICD-10-CM

## 2020-01-21 DIAGNOSIS — N2581 Secondary hyperparathyroidism of renal origin: Secondary | ICD-10-CM | POA: Diagnosis present

## 2020-01-21 DIAGNOSIS — F329 Major depressive disorder, single episode, unspecified: Secondary | ICD-10-CM | POA: Diagnosis present

## 2020-01-21 DIAGNOSIS — Z7901 Long term (current) use of anticoagulants: Secondary | ICD-10-CM

## 2020-01-21 DIAGNOSIS — D7589 Other specified diseases of blood and blood-forming organs: Secondary | ICD-10-CM | POA: Diagnosis present

## 2020-01-21 DIAGNOSIS — N1832 Chronic kidney disease, stage 3b: Secondary | ICD-10-CM | POA: Diagnosis present

## 2020-01-21 DIAGNOSIS — Z888 Allergy status to other drugs, medicaments and biological substances status: Secondary | ICD-10-CM

## 2020-01-21 DIAGNOSIS — Z961 Presence of intraocular lens: Secondary | ICD-10-CM | POA: Diagnosis present

## 2020-01-21 DIAGNOSIS — I4891 Unspecified atrial fibrillation: Secondary | ICD-10-CM | POA: Diagnosis not present

## 2020-01-21 DIAGNOSIS — N189 Chronic kidney disease, unspecified: Secondary | ICD-10-CM

## 2020-01-21 DIAGNOSIS — I248 Other forms of acute ischemic heart disease: Secondary | ICD-10-CM | POA: Diagnosis present

## 2020-01-21 DIAGNOSIS — Z8261 Family history of arthritis: Secondary | ICD-10-CM | POA: Diagnosis not present

## 2020-01-21 DIAGNOSIS — I1310 Hypertensive heart and chronic kidney disease without heart failure, with stage 1 through stage 4 chronic kidney disease, or unspecified chronic kidney disease: Secondary | ICD-10-CM | POA: Diagnosis present

## 2020-01-21 DIAGNOSIS — Z881 Allergy status to other antibiotic agents status: Secondary | ICD-10-CM

## 2020-01-21 DIAGNOSIS — I480 Paroxysmal atrial fibrillation: Secondary | ICD-10-CM | POA: Diagnosis present

## 2020-01-21 DIAGNOSIS — Z79890 Hormone replacement therapy (postmenopausal): Secondary | ICD-10-CM | POA: Diagnosis not present

## 2020-01-21 DIAGNOSIS — I482 Chronic atrial fibrillation, unspecified: Secondary | ICD-10-CM | POA: Diagnosis present

## 2020-01-21 DIAGNOSIS — E039 Hypothyroidism, unspecified: Secondary | ICD-10-CM | POA: Diagnosis present

## 2020-01-21 DIAGNOSIS — U071 COVID-19: Secondary | ICD-10-CM | POA: Diagnosis not present

## 2020-01-21 DIAGNOSIS — Z885 Allergy status to narcotic agent status: Secondary | ICD-10-CM

## 2020-01-21 DIAGNOSIS — M109 Gout, unspecified: Secondary | ICD-10-CM | POA: Diagnosis present

## 2020-01-21 DIAGNOSIS — Z79899 Other long term (current) drug therapy: Secondary | ICD-10-CM | POA: Diagnosis not present

## 2020-01-21 DIAGNOSIS — K219 Gastro-esophageal reflux disease without esophagitis: Secondary | ICD-10-CM | POA: Diagnosis present

## 2020-01-21 DIAGNOSIS — Z87440 Personal history of urinary (tract) infections: Secondary | ICD-10-CM

## 2020-01-21 DIAGNOSIS — E785 Hyperlipidemia, unspecified: Secondary | ICD-10-CM | POA: Diagnosis present

## 2020-01-21 LAB — FERRITIN: Ferritin: 157 ng/mL (ref 24–336)

## 2020-01-21 LAB — HEPATIC FUNCTION PANEL
ALT: 16 U/L (ref 0–44)
AST: 33 U/L (ref 15–41)
Albumin: 3.5 g/dL (ref 3.5–5.0)
Alkaline Phosphatase: 70 U/L (ref 38–126)
Bilirubin, Direct: 0.2 mg/dL (ref 0.0–0.2)
Indirect Bilirubin: 0.5 mg/dL (ref 0.3–0.9)
Total Bilirubin: 0.7 mg/dL (ref 0.3–1.2)
Total Protein: 7.3 g/dL (ref 6.5–8.1)

## 2020-01-21 LAB — BASIC METABOLIC PANEL
Anion gap: 9 (ref 5–15)
BUN: 30 mg/dL — ABNORMAL HIGH (ref 8–23)
CO2: 21 mmol/L — ABNORMAL LOW (ref 22–32)
Calcium: 7.9 mg/dL — ABNORMAL LOW (ref 8.9–10.3)
Chloride: 105 mmol/L (ref 98–111)
Creatinine, Ser: 2.48 mg/dL — ABNORMAL HIGH (ref 0.61–1.24)
GFR calc Af Amer: 30 mL/min — ABNORMAL LOW (ref >60)
GFR calc non Af Amer: 26 mL/min — ABNORMAL LOW (ref >60)
Glucose, Bld: 112 mg/dL — ABNORMAL HIGH (ref 70–99)
Potassium: 4.4 mmol/L (ref 3.5–5.1)
Sodium: 135 mmol/L (ref 135–145)

## 2020-01-21 LAB — PROCALCITONIN: Procalcitonin: 0.1 ng/mL

## 2020-01-21 LAB — RESPIRATORY PANEL BY RT PCR (FLU A&B, COVID)
Influenza A by PCR: NEGATIVE
Influenza B by PCR: NEGATIVE
SARS Coronavirus 2 by RT PCR: POSITIVE — AB

## 2020-01-21 LAB — URINALYSIS, COMPLETE (UACMP) WITH MICROSCOPIC
Bacteria, UA: NONE SEEN
Bilirubin Urine: NEGATIVE
Glucose, UA: NEGATIVE mg/dL
Hgb urine dipstick: NEGATIVE
Ketones, ur: NEGATIVE mg/dL
Leukocytes,Ua: NEGATIVE
Nitrite: NEGATIVE
Protein, ur: 100 mg/dL — AB
Specific Gravity, Urine: 1.019 (ref 1.005–1.030)
Squamous Epithelial / HPF: NONE SEEN (ref 0–5)
pH: 5 (ref 5.0–8.0)

## 2020-01-21 LAB — LACTIC ACID, PLASMA: Lactic Acid, Venous: 1.1 mmol/L (ref 0.5–1.9)

## 2020-01-21 LAB — TROPONIN I (HIGH SENSITIVITY)
Troponin I (High Sensitivity): 26 ng/L — ABNORMAL HIGH (ref <18)
Troponin I (High Sensitivity): 32 ng/L — ABNORMAL HIGH (ref <18)

## 2020-01-21 LAB — LACTATE DEHYDROGENASE: LDH: 100 U/L (ref 98–192)

## 2020-01-21 LAB — ABO/RH: ABO/RH(D): A POS

## 2020-01-21 LAB — FIBRIN DERIVATIVES D-DIMER (ARMC ONLY): Fibrin derivatives D-dimer (ARMC): 230.45 ng/mL (FEU) (ref 0.00–499.00)

## 2020-01-21 LAB — CBC
HCT: 42 % (ref 39.0–52.0)
Hemoglobin: 13.4 g/dL (ref 13.0–17.0)
MCH: 32.1 pg (ref 26.0–34.0)
MCHC: 31.9 g/dL (ref 30.0–36.0)
MCV: 100.7 fL — ABNORMAL HIGH (ref 80.0–100.0)
Platelets: 103 10*3/uL — ABNORMAL LOW (ref 150–400)
RBC: 4.17 MIL/uL — ABNORMAL LOW (ref 4.22–5.81)
RDW: 13.9 % (ref 11.5–15.5)
WBC: 3.7 10*3/uL — ABNORMAL LOW (ref 4.0–10.5)
nRBC: 0 % (ref 0.0–0.2)

## 2020-01-21 LAB — MAGNESIUM: Magnesium: 1.4 mg/dL — ABNORMAL LOW (ref 1.7–2.4)

## 2020-01-21 LAB — AMMONIA: Ammonia: <9 umol/L — ABNORMAL LOW (ref 9–35)

## 2020-01-21 LAB — ETHANOL: Alcohol, Ethyl (B): <10 mg/dL (ref <10)

## 2020-01-21 LAB — BRAIN NATRIURETIC PEPTIDE: B Natriuretic Peptide: 60 pg/mL (ref 0.0–100.0)

## 2020-01-21 MED ORDER — LEVOTHYROXINE SODIUM 88 MCG PO TABS
88.0000 ug | ORAL_TABLET | Freq: Every day | ORAL | Status: DC
  Administered 2020-01-22 – 2020-01-25 (×4): 88 ug via ORAL
  Filled 2020-01-21 (×4): qty 1

## 2020-01-21 MED ORDER — ACETAMINOPHEN 325 MG PO TABS: 650.0000 mg | ORAL_TABLET | Freq: Four times a day (QID) | ORAL | Status: DC | PRN

## 2020-01-21 MED ORDER — VITAMIN D 25 MCG (1000 UNIT) PO TABS: 1000.0000 [IU] | ORAL_TABLET | Freq: Every day | ORAL | Status: DC

## 2020-01-21 MED ORDER — ASCORBIC ACID 500 MG PO TABS
500.0000 mg | ORAL_TABLET | Freq: Every day | ORAL | Status: DC
  Administered 2020-01-22 – 2020-01-25 (×5): 500 mg via ORAL
  Filled 2020-01-21 (×5): qty 1

## 2020-01-21 MED ORDER — APIXABAN 5 MG PO TABS
5.0000 mg | ORAL_TABLET | Freq: Two times a day (BID) | ORAL | Status: DC
  Administered 2020-01-22 – 2020-01-25 (×8): 5 mg via ORAL
  Filled 2020-01-21 (×8): qty 1

## 2020-01-21 MED ORDER — DILTIAZEM HCL 30 MG PO TABS
30.0000 mg | ORAL_TABLET | Freq: Four times a day (QID) | ORAL | Status: DC
  Administered 2020-01-22: 01:00:00 30 mg via ORAL
  Filled 2020-01-21: qty 1

## 2020-01-21 MED ORDER — METHYLPHENIDATE HCL 10 MG PO TABS
5.0000 mg | ORAL_TABLET | Freq: Every day | ORAL | Status: DC
  Administered 2020-01-22 – 2020-01-25 (×4): 5 mg via ORAL
  Filled 2020-01-21 (×4): qty 1

## 2020-01-21 MED ORDER — ALLOPURINOL 100 MG PO TABS
100.0000 mg | ORAL_TABLET | Freq: Every day | ORAL | Status: DC
  Administered 2020-01-22 – 2020-01-25 (×4): 100 mg via ORAL
  Filled 2020-01-21 (×5): qty 1

## 2020-01-21 MED ORDER — ASPIRIN EC 81 MG PO TBEC
81.0000 mg | DELAYED_RELEASE_TABLET | Freq: Every day | ORAL | Status: DC
  Administered 2020-01-22 – 2020-01-25 (×5): 81 mg via ORAL
  Filled 2020-01-21 (×5): qty 1

## 2020-01-21 MED ORDER — SODIUM CHLORIDE 0.9 % IV SOLN
100.0000 mg | Freq: Every day | INTRAVENOUS | Status: DC
  Filled 2020-01-21: qty 20

## 2020-01-21 MED ORDER — GUAIFENESIN ER 600 MG PO TB12
600.0000 mg | ORAL_TABLET | Freq: Two times a day (BID) | ORAL | Status: DC
  Administered 2020-01-22 – 2020-01-24 (×7): 600 mg via ORAL
  Filled 2020-01-21 (×9): qty 1

## 2020-01-21 MED ORDER — MAGNESIUM HYDROXIDE 400 MG/5ML PO SUSP
30.0000 mL | Freq: Every day | ORAL | Status: DC | PRN
  Filled 2020-01-21: qty 30

## 2020-01-21 MED ORDER — DILTIAZEM LOAD VIA INFUSION
10.0000 mg | Freq: Once | INTRAVENOUS | Status: AC
  Administered 2020-01-21: 20:00:00 10 mg via INTRAVENOUS
  Filled 2020-01-21: qty 10

## 2020-01-21 MED ORDER — CALCITRIOL 0.25 MCG PO CAPS
0.2500 ug | ORAL_CAPSULE | Freq: Every day | ORAL | Status: DC
  Administered 2020-01-22 – 2020-01-25 (×4): 0.25 ug via ORAL
  Filled 2020-01-21 (×6): qty 1

## 2020-01-21 MED ORDER — DOXAZOSIN MESYLATE 1 MG PO TABS
1.0000 mg | ORAL_TABLET | Freq: Every day | ORAL | Status: DC
  Filled 2020-01-21 (×5): qty 1

## 2020-01-21 MED ORDER — SODIUM CHLORIDE 0.9 % IV SOLN
200.0000 mg | Freq: Once | INTRAVENOUS | Status: DC
  Filled 2020-01-21: qty 40

## 2020-01-21 MED ORDER — SODIUM CHLORIDE 0.9 % IV BOLUS
1000.0000 mL | Freq: Once | INTRAVENOUS | Status: AC
  Administered 2020-01-21: 1000 mL via INTRAVENOUS

## 2020-01-21 MED ORDER — ONDANSETRON HCL 4 MG PO TABS: 4.0000 mg | ORAL_TABLET | Freq: Four times a day (QID) | ORAL | Status: DC | PRN

## 2020-01-21 MED ORDER — FLUTICASONE PROPIONATE 50 MCG/ACT NA SUSP: 2.0000 | Freq: Every day | NASAL | Status: DC

## 2020-01-21 MED ORDER — MIRTAZAPINE 15 MG PO TABS
15.0000 mg | ORAL_TABLET | Freq: Every day | ORAL | Status: DC
  Administered 2020-01-22 – 2020-01-24 (×4): 15 mg via ORAL
  Filled 2020-01-21 (×4): qty 1

## 2020-01-21 MED ORDER — ONDANSETRON HCL 4 MG/2ML IJ SOLN: 4.0000 mg | Freq: Four times a day (QID) | INTRAMUSCULAR | Status: DC | PRN

## 2020-01-21 MED ORDER — FAMOTIDINE 20 MG PO TABS
20.0000 mg | ORAL_TABLET | Freq: Every day | ORAL | Status: DC
  Administered 2020-01-22 – 2020-01-25 (×4): 20 mg via ORAL
  Filled 2020-01-21 (×4): qty 1

## 2020-01-21 MED ORDER — SODIUM CHLORIDE 0.9 % IV SOLN: INTRAVENOUS | Status: DC

## 2020-01-21 MED ORDER — DEXAMETHASONE SODIUM PHOSPHATE 10 MG/ML IJ SOLN
6.0000 mg | INTRAMUSCULAR | Status: DC
  Administered 2020-01-22 – 2020-01-25 (×4): 6 mg via INTRAVENOUS
  Filled 2020-01-21 (×4): qty 1

## 2020-01-21 MED ORDER — ZINC SULFATE 220 (50 ZN) MG PO CAPS
220.0000 mg | ORAL_CAPSULE | Freq: Every day | ORAL | Status: DC
  Administered 2020-01-22 – 2020-01-25 (×5): 220 mg via ORAL
  Filled 2020-01-21 (×5): qty 1

## 2020-01-21 MED ORDER — MAGNESIUM SULFATE 2 GM/50ML IV SOLN
2.0000 g | Freq: Once | INTRAVENOUS | Status: AC
  Administered 2020-01-21: 20:00:00 2 g via INTRAVENOUS
  Filled 2020-01-21: qty 50

## 2020-01-21 MED ORDER — CYANOCOBALAMIN 500 MCG PO TABS
500.0000 ug | ORAL_TABLET | Freq: Every day | ORAL | Status: DC
  Administered 2020-01-22 – 2020-01-25 (×3): 500 ug via ORAL
  Filled 2020-01-21 (×5): qty 1

## 2020-01-21 MED ORDER — TRAZODONE HCL 50 MG PO TABS
25.0000 mg | ORAL_TABLET | Freq: Every evening | ORAL | Status: DC | PRN
  Administered 2020-01-22 – 2020-01-24 (×2): 25 mg via ORAL
  Filled 2020-01-21 (×2): qty 1

## 2020-01-21 MED ORDER — PRAVASTATIN SODIUM 20 MG PO TABS
40.0000 mg | ORAL_TABLET | Freq: Every day | ORAL | Status: DC
  Administered 2020-01-22 – 2020-01-24 (×4): 40 mg via ORAL
  Filled 2020-01-21 (×3): qty 2
  Filled 2020-01-21 (×2): qty 1

## 2020-01-21 MED ORDER — HYDROCOD POLST-CPM POLST ER 10-8 MG/5ML PO SUER
5.0000 mL | Freq: Two times a day (BID) | ORAL | Status: DC | PRN
  Administered 2020-01-24: 5 mL via ORAL
  Filled 2020-01-21: qty 5

## 2020-01-21 MED ORDER — GUAIFENESIN-DM 100-10 MG/5ML PO SYRP
10.0000 mL | ORAL_SOLUTION | ORAL | Status: DC | PRN
  Administered 2020-01-23: 10 mL via ORAL
  Filled 2020-01-21 (×3): qty 10

## 2020-01-21 MED ORDER — VITAMIN D 25 MCG (1000 UNIT) PO TABS
2000.0000 [IU] | ORAL_TABLET | Freq: Every day | ORAL | Status: DC
  Administered 2020-01-24: 10:00:00 2000 [IU] via ORAL

## 2020-01-21 MED ORDER — FLUOXETINE HCL 20 MG PO CAPS
20.0000 mg | ORAL_CAPSULE | Freq: Every day | ORAL | Status: DC
  Administered 2020-01-22 – 2020-01-25 (×4): 20 mg via ORAL
  Filled 2020-01-21 (×4): qty 1

## 2020-01-21 MED ORDER — DEXAMETHASONE SODIUM PHOSPHATE 10 MG/ML IJ SOLN
6.0000 mg | Freq: Once | INTRAMUSCULAR | Status: AC
  Administered 2020-01-21: 22:00:00 6 mg via INTRAVENOUS
  Filled 2020-01-21: qty 1

## 2020-01-21 MED ORDER — FAMOTIDINE 20 MG PO TABS: 20.0000 mg | ORAL_TABLET | Freq: Two times a day (BID) | ORAL | Status: DC

## 2020-01-21 MED ORDER — DILTIAZEM HCL-DEXTROSE 125-5 MG/125ML-% IV SOLN (PREMIX)
5.0000 mg/h | INTRAVENOUS | Status: DC
  Administered 2020-01-21: 20:00:00 5 mg/h via INTRAVENOUS
  Filled 2020-01-21: qty 125

## 2020-01-21 MED ORDER — FERROUS SULFATE 325 (65 FE) MG PO TABS
325.0000 mg | ORAL_TABLET | Freq: Three times a day (TID) | ORAL | Status: DC
  Administered 2020-01-22 – 2020-01-25 (×8): 325 mg via ORAL
  Filled 2020-01-21 (×10): qty 1

## 2020-01-21 NOTE — Progress Notes (Signed)
Remdesivir - Pharmacy Brief Note   O:  ALT:  CXR:  SpO2: % on    A/P:  Remdesivir 200 mg IVPB once followed by 100 mg IVPB daily x 4 days.   Thao Vanover D 01/21/2020 9:40 PM

## 2020-01-21 NOTE — ED Notes (Signed)
Spoke with patient's wife and updated her about plan of care

## 2020-01-21 NOTE — H&P (Addendum)
[place] [facility] NAME: Duane [name]    MR#:  [id number]  DATE OF BIRTH:  August 30, 1951  DATE OF ADMISSION:  01/21/2020  PRIMARY CARE PHYSICIAN: Delsa Grana, PA-C   REQUESTING/REFERRING PHYSICIAN: Duffy Bruce, MD CHIEF COMPLAINT:   Chief Complaint  Patient presents with  . Weakness    HISTORY OF PRESENT ILLNESS:  Duane [name]  is a 69 y.o. Caucasian male with a known history of atrial fibrillation, hypertension, hyper lipidemia, hypothyroidism and previous history of CVA, presented to emergency room acute onset of generalized weakness and lack of energy which has been significantly worse today.  He started getting sick on Friday with cough as well as chills with low-grade fever of 99 without dyspnea or wheezing.  Denies any body aches, loss of taste or smell.  He admitted to diarrhea once.  Denies any nausea or vomiting or abdominal pain.  No chest pain or palpitations.  Denies any headache or dizziness or blurred vision.  No dysuria, oliguria or hematuria or flank pain.  He has been having postnasal drip with mild nasal congestion and feeling of choking.  Upon presentation to the emergency room, heart rate was 119 with otherwise normal vital signs.  Labs revealed a BUN of 30 and creatinine of 2.48 slightly worse than his previous levels and a CO2 of 21 with magnesium level of 1.4.  BNP was 60 and high-sensitivity troponin I was 26.  Lactic acid was 1.1.  CBC showed mild leukopenia of 3.7 down from 6.4 in October of last year.  It showed macrocytosis and thrombocytopenia 103 compared to 144 then.  His COVID-19 PCR came back positive and influenza antigens were negative.  Urinalysis was unremarkable except for 100 protein.  Alcohol levels less than 10.  Chest x-ray showed cardiomegaly with no acute cardiopulmonary disease. EKG initially showed atrial fibrillation with rapid ventricular sponsor 127.  The patient was given 10 mg of IV Cardizem bolus followed by  drip.  Systolic blood pressure dropped to 88 at which time IV Cardizem drip was stopped.  He maintained heart rate in the 80s still in atrial fibrillation.  He was given IV remdesivir, 2 g of IV magnesium sulfate, 6 mg of IV Decadron and 1 L bolus of IV normal saline.  He will be admitted to a progressive cardiac bed for further evaluation and management. PAST MEDICAL HISTORY:   Past Medical History:  Diagnosis Date  . A-fib [facility])    requiring life-long anticouagulation  . Aortic ectasia, abdominal ([place]) 05/22/2018   [place] Sept 2018  . Cerebellar infarct ([place])   . CKD (chronic kidney disease)    creatinine baseline 1.3-1.5 in 2015  . GERD (gastroesophageal reflux disease)   . Gout   . Graves disease   . H/O hyperkalemia    with Bactrim  . Hyperlipidemia    requiring life long statin therapy  . Hyperparathyroidism, secondary renal ([place]) 10/02/2017  . Hypertension   . Hypothyroidism   . Left hemiparesis [facility]) May 2015   s/p strke  . Poor short term memory   . Renal insufficiency   . Restrictive lung disease    mild  . Stroke ([place]) 02/20/14   left side hemiparesis, dysphagia  . UTI (urinary tract infection) due to Enterococcus May 2015  . Vitamin D deficiency disease     PAST SURGICAL HISTORY:   Past Surgical History:  Procedure Laterality Date  . CATARACT EXTRACTION W/PHACO Right 08/27/2016   Procedure: CATARACT EXTRACTION  PHACO AND INTRAOCULAR LENS PLACEMENT (IOC);  Surgeon: Eulogio Bear, MD;  Location: [place];  Service: Ophthalmology;  Laterality: Right;  RIGHT  . ENTEROSTOMY CLOSURE  11/27/15   ileostomy takedown  . EXPLORATORY LAPAROTOMY  11/27/15  . illeostomy  07/2014  . POLYPECTOMY  10/15   GI surgery to remove polyp    SOCIAL HISTORY:   Social History   Tobacco Use  . Smoking status: Never Smoker  . Smokeless tobacco: Never Used  . Tobacco comment: smoking cessation materials not required  Substance Use Topics  . Alcohol use: No    FAMILY  HISTORY:   Family History  Problem Relation Age of Onset  . Arthritis Mother   . Heart disease Mother   . Heart attack Father   . Heart disease Father   . Cirrhosis Sister   . Hyperparathyroidism Neg Hx     DRUG ALLERGIES:   Allergies  Allergen Reactions  . Bactrim [Sulfamethoxazole-Trimethoprim] Other (See Comments)    Hyperkalemia  . Chlorpromazine Other (See Comments)    Hiccups, hallucinations  . Cyclobenzaprine Other (See Comments)    Sleeps for 8 hrs after taking it  . Oxycodone Other (See Comments)    Excessive sleepiness    REVIEW OF SYSTEMS:   ROS As per history of present illness. All pertinent systems were reviewed above. Constitutional,  HEENT, cardiovascular, respiratory, GI, GU, musculoskeletal, neuro, psychiatric, endocrine,  integumentary and hematologic systems were reviewed and are otherwise  negative/unremarkable except for positive findings mentioned above in the HPI.   MEDICATIONS AT HOME:   Prior to Admission medications   Medication Sig Start Date End Date Taking? Authorizing Provider  allopurinol (ZYLOPRIM) 300 MG tablet Take 300 mg by mouth daily.    [provider]  apixaban (ELIQUIS) 5 MG TABS tablet Take 1 tablet (5 mg total) by mouth 2 (two) times daily. 08/29/16   Arnetha Courser, MD  calcitRIOL (ROCALTROL) 0.25 MCG capsule Take 0.25 mcg daily by mouth.  05/26/17   [provider]  Cholecalciferol (VITAMIN D3) 2000 units capsule Take 2,000 Units by mouth daily.    [provider]  cyanocobalamin 500 MCG tablet Take 500 mcg by mouth daily.    [provider]  doxazosin (CARDURA) 1 MG tablet Take 1 tablet (1 mg total) by mouth daily. 09/17/19   Delsa Grana, PA-C  famotidine (PEPCID) 20 MG tablet Take 1 tablet by mouth twice daily 03/25/19   Hubbard Hartshorn, FNP  ferrous sulfate 325 (65 FE) MG tablet Take 325 mg by mouth 3 (three) times daily with meals.     [provider]  FLUoxetine (PROZAC) 20 MG  tablet Take 1 tablet (20 mg total) by mouth daily. 09/17/19   Delsa Grana, PA-C  levothyroxine (SYNTHROID) 88 MCG tablet Take 1 tablet (88 mcg total) by mouth daily. 09/17/19   Delsa Grana, PA-C  losartan (COZAAR) 25 MG tablet Take 1 tablet (25 mg total) by mouth daily. 09/17/19   Delsa Grana, PA-C  methylphenidate (RITALIN) 5 MG tablet Take 1 tablet (5 mg total) by mouth daily. If needed for attention or focus 09/17/19   Delsa Grana, PA-C  methylphenidate (RITALIN) 5 MG tablet Take 1 tablet (5 mg total) by mouth daily. 12/17/19   Delsa Grana, PA-C  methylphenidate (RITALIN) 5 MG tablet Take 1 tablet (5 mg total) by mouth daily. 02/16/20   Delsa Grana, PA-C  methylphenidate (RITALIN) 5 MG tablet Take 1 tablet (5 mg total) by mouth daily.  01/17/20   Delsa Grana, PA-C  mirtazapine (REMERON) 15 MG tablet Take 1 tablet (15 mg total) by mouth at bedtime. 09/17/19   Delsa Grana, PA-C  pravastatin (PRAVACHOL) 40 MG tablet Take 1 tablet (40 mg total) by mouth daily. 09/17/19   Delsa Grana, PA-C      VITAL SIGNS:  Blood pressure 98/61, pulse 87, temperature 98.4 F (36.9 C), temperature source Oral, resp. rate 20, height 6\' 4"  (1.93 m), weight 96.2 kg, SpO2 98 %.  PHYSICAL EXAMINATION:  Physical Exam  GENERAL:  69 y.o.-year-old Caucasian male patient lying in the bed with minimal respiratory distress with mild conversational dyspnea. EYES: Pupils equal, round, reactive to light and accommodation. No scleral icterus. Extraocular muscles intact.  HEENT: Head atraumatic, normocephalic. Oropharynx and nasopharynx clear.  NECK:  Supple, no jugular venous distention. No thyroid enlargement, no tenderness.  LUNGS: Slightly diminished bibasal breath sounds with right basal crackles. CARDIOVASCULAR: Irregularly irregular rhythm, S1, S2 normal. No murmurs, rubs, or gallops.  ABDOMEN: Soft, nondistended, nontender. Bowel sounds present. No organomegaly or mass.  EXTREMITIES: No pedal edema, cyanosis, or clubbing.   NEUROLOGIC: Cranial nerves II through XII are intact. Muscle strength 5/5 in all extremities. Sensation intact. Gait not checked.  PSYCHIATRIC: The patient is alert and oriented x 3.  Normal affect and good eye contact. SKIN: No obvious rash, lesion, or ulcer.   LABORATORY PANEL:   CBC Recent Labs  Lab 01/21/20 1723  WBC 3.7*  HGB 13.4  HCT 42.0  PLT 103*   ------------------------------------------------------------------------------------------------------------------  Chemistries  Recent Labs  Lab 01/21/20 1710 01/21/20 1723  NA  --  135  K  --  4.4  CL  --  105  CO2  --  21*  GLUCOSE  --  112*  BUN  --  30*  CREATININE  --  2.48*  CALCIUM  --  7.9*  MG  --  1.4*  AST 33  --   ALT 16  --   ALKPHOS 70  --   BILITOT 0.7  --    ------------------------------------------------------------------------------------------------------------------  Cardiac Enzymes No results for input(s): TROPONINI in the last 168 hours. ------------------------------------------------------------------------------------------------------------------  RADIOLOGY:  DG Chest Portable 1 View  Result Date: 01/21/2020 CLINICAL DATA:  Weakness EXAM: PORTABLE CHEST 1 VIEW COMPARISON:  08/21/2014 FINDINGS: Cardiomegaly. No confluent airspace opacities or effusions. No edema. No acute bony abnormality. IMPRESSION: Cardiomegaly.  No acute cardiopulmonary disease. Electronically Signed   By: Rolm Baptise M.D.   On: 01/21/2020 18:40      IMPRESSION AND PLAN:   1.  Paroxysmal atrial fibrillation with rapid ventricular response. -The patient will be admitted to a progressive cardiac unit bed. -We will continue him on p.o. Cardizem with holding parameters. -We will obtain a 2D echo and a cardiology consultation in a.m. -I notified Dr. Nehemiah Massed about the patient. -We will optimize his magnesium as hypomagnesemia could be contributing to his rapid ventricular response. -We will check his TSH as  well. -We will continue his p.o. Eliquis.  2.  COVID-19. -The patient was given IV remdesivir -We will obtain inflammatory markers and follow serial levels. -He will be placed on p.o. vitamin D as well as aspirin, zinc and vitamin C. -Steroid therapy will be continued with inflammatory markers elevation. -We will follow his procalcitonin level as well. -Should his CRP came back elevated above 7, we will give him Actemra. -O2 protocol will be followed. -Given his respiratory symptoms with clear chest x-ray, will obtain a chest CT without  contrast.  3.  Mild acute kidney injury superimposed on stage IIIb chronic kidney disease. -The patient will be hydrated with IV normal saline and will follow serial BMPs.  4.  Gout. -We will continue allopurinol.  5.  Depression. -We will continue Prozac.  6.  DVT prophylaxis. -We will continue p.o. Eliquis.  All the records are reviewed and case discussed with ED provider. The plan of care was discussed in details with the patient (and family). I answered all questions. The patient agreed to proceed with the above mentioned plan. Further management will depend upon hospital course.   CODE STATUS: Full code  Status is: Inpatient  Remains inpatient appropriate because:Inpatient level of care appropriate due to severity of illness   Dispo: The patient is from: Home              Anticipated d/c is to: Home              Anticipated d/c date is: 3 days              Patient currently is not medically stable to d/c.     TOTAL TIME TAKING CARE OF THIS PATIENT: 55 minutes.    Christel Mormon M.D on 01/21/2020 at 9:07 PM  [facility]   From 7 PM-7 AM, contact night-coverage [email or web address]  CC: Primary care physician; Delsa Grana, PA-C   Note: This dictation was prepared with Dragon dictation along with smaller phrase technology. Any transcriptional errors that result from this process are unintentional.

## 2020-01-21 NOTE — ED Notes (Signed)
Patient transported to CT 

## 2020-01-21 NOTE — ED Notes (Signed)
This RN called lab to add on new specimen orders to lab tubs already sent.

## 2020-01-21 NOTE — ED Triage Notes (Signed)
Pt presents from home via [facility] with c/o weakness that has gotten progressively worse over the past week. Pt baseline can walk independently, but now requires using a cane and can only walk short distances. Pt has hx of stage 4 renal failure. Denies shortness of breath or chest pain. Pt currently alert and oriented x4.

## 2020-01-21 NOTE — ED Notes (Signed)
Patient provided with meal tray and water 

## 2020-01-21 NOTE — ED Provider Notes (Signed)
[facility] Emergency Department Provider Note  ____________________________________________   First MD Initiated Contact with Patient 01/21/20 1738     (approximate)  I have reviewed the triage vital signs and the nursing notes.   HISTORY  Chief Complaint Weakness    HPI Duane [name] is a 69 y.o. male with extensive past medical history including CKD, A. fib, hypertension, hyperlipidemia, prior stroke, history of UTIs, here with generalized weakness.  The patient states that over the last week, he has felt progressively, increasingly weak.  He states that he just noticed that he began to have very low energy and like he cannot do anything without getting extremely winded.  He states that anytime he tries to move, he feels extremely short of breath and out of energy.  He has essentially had difficulty just getting around the house.  He states he feels similar to when he was admitted and had to spend a significant amount of time in rehab in the past.  Denies any nausea or vomiting.  He has had slightly decreased appetite.  No diarrhea or constipation.  No other complaints.  No fevers or chills.        Past Medical History:  Diagnosis Date  . A-fib [facility])    requiring life-long anticouagulation  . Aortic ectasia, abdominal ([place]) 05/22/2018   [place] Sept 2018  . Cerebellar infarct ([place])   . CKD (chronic kidney disease)    creatinine baseline 1.3-1.5 in 2015  . GERD (gastroesophageal reflux disease)   . Gout   . Graves disease   . H/O hyperkalemia    with Bactrim  . Hyperlipidemia    requiring life long statin therapy  . Hyperparathyroidism, secondary renal ([place]) 10/02/2017  . Hypertension   . Hypothyroidism   . Left hemiparesis [facility]) May 2015   s/p strke  . Poor short term memory   . Renal insufficiency   . Restrictive lung disease    mild  . Stroke ([place]) 02/20/14   left side hemiparesis, dysphagia  . UTI (urinary tract infection) due to  Enterococcus May 2015  . Vitamin D deficiency disease     Patient Active Problem List   Diagnosis Date Noted  . Episode of recurrent major depressive disorder ([place]) 06/11/2019  . Aortic ectasia, abdominal ([place]) 05/22/2018  . Hyperparathyroidism, secondary renal ([place]) 10/02/2017  . Bursitis of shoulder 06/23/2017  . Controlled substance agreement signed 05/23/2017  . On stimulant medication 02/22/2017  . Chronic low back pain without sciatica 08/21/2016  . Low libido 05/23/2016  . Colon polyp 11/27/2015  . Vitamin D deficiency 09/04/2015  . 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 Anemia, chronic renal failure 06/09/2015  . Chronic kidney disease, stage IV (severe) ([place])   . Hypertension   . Chronic gout due to renal impairment   . Hyperlipidemia   . Hypothyroidism following radioiodine therapy   . Restrictive lung disease   . A-fib ([place])   . History of gout 02/28/2014  . Dysphagia following cerebral infarction 02/21/2014  . History of arterial ischemic stroke 02/21/2014  . Left hemiparesis ([place]) 02/11/2014    Past Surgical History:  Procedure Laterality Date  . CATARACT EXTRACTION W/PHACO Right 08/27/2016   Procedure: CATARACT EXTRACTION PHACO AND INTRAOCULAR LENS PLACEMENT (IOC);  Surgeon: Eulogio Bear, MD;  Location: [place];  Service: Ophthalmology;  Laterality: Right;  RIGHT  . ENTEROSTOMY CLOSURE  11/27/15   ileostomy takedown  . EXPLORATORY LAPAROTOMY  11/27/15  . illeostomy  07/2014  . POLYPECTOMY  10/15   GI surgery to remove polyp    Prior to Admission medications   Medication Sig Start Date End Date Taking? Authorizing Provider  allopurinol (ZYLOPRIM) 300 MG tablet Take 300 mg by mouth daily.    [provider]  apixaban (ELIQUIS) 5 MG TABS tablet Take 1 tablet (5 mg total) by mouth 2 (two) times daily. 08/29/16   Arnetha Courser, MD  calcitRIOL (ROCALTROL) 0.25 MCG capsule Take 0.25 mcg daily by mouth.  05/26/17   [provider]  Cholecalciferol (VITAMIN D3) 2000 units capsule Take 2,000 Units by mouth daily.    [provider]  cyanocobalamin 500 MCG tablet Take 500 mcg by mouth daily.    [provider]  doxazosin (CARDURA) 1 MG tablet Take 1 tablet (1 mg total) by mouth daily. 09/17/19   Delsa Grana, PA-C  famotidine (PEPCID) 20 MG tablet Take 1 tablet by mouth twice daily 03/25/19   Hubbard Hartshorn, FNP  ferrous sulfate 325 (65 FE) MG tablet Take 325 mg by mouth 3 (three) times daily with meals.     [provider]  FLUoxetine (PROZAC) 20 MG tablet Take 1 tablet (20 mg total) by mouth daily. 09/17/19   Delsa Grana, PA-C  levothyroxine (SYNTHROID) 88 MCG tablet Take 1 tablet (88 mcg total) by mouth daily. 09/17/19   Delsa Grana, PA-C  losartan (COZAAR) 25 MG tablet Take 1 tablet (25 mg total) by mouth daily. 09/17/19   Delsa Grana, PA-C  methylphenidate (RITALIN) 5 MG tablet Take 1 tablet (5 mg total) by mouth daily. If needed for attention or focus 09/17/19   Delsa Grana, PA-C  methylphenidate (RITALIN) 5 MG tablet Take 1 tablet (5 mg total) by mouth daily. 12/17/19   Delsa Grana, PA-C  methylphenidate (RITALIN) 5 MG tablet Take 1 tablet (5 mg total) by mouth daily. 02/16/20   Delsa Grana, PA-C  methylphenidate (RITALIN) 5 MG tablet Take 1 tablet (5 mg total) by mouth daily. 01/17/20   Delsa Grana, PA-C  mirtazapine (REMERON) 15 MG tablet Take 1 tablet (15 mg total) by mouth at bedtime. 09/17/19   Delsa Grana, PA-C  pravastatin (PRAVACHOL) 40 MG tablet Take 1 tablet (40 mg total) by mouth daily. 09/17/19   Delsa Grana, PA-C    Allergies Bactrim [sulfamethoxazole-trimethoprim], Chlorpromazine, Cyclobenzaprine, and Oxycodone  Family History  Problem Relation Age of Onset  . Arthritis Mother   . Heart disease Mother   . Heart attack Father   . Heart disease Father   . Cirrhosis Sister   . Hyperparathyroidism Neg Hx     Social History Social History   Tobacco Use  .  Smoking status: Never Smoker  . Smokeless tobacco: Never Used  . Tobacco comment: smoking cessation materials not required  Substance Use Topics  . Alcohol use: No  . Drug use: No    Review of Systems  Review of Systems  Constitutional: Positive for fatigue. Negative for chills and fever.  HENT: Negative for sore throat.   Respiratory: Positive for shortness of breath.   Cardiovascular: Negative for chest pain.  Gastrointestinal: Negative for abdominal pain.  Genitourinary: Negative for flank pain.  Musculoskeletal: Negative for neck pain.  Skin: Negative for rash and wound.  Allergic/Immunologic: Negative for immunocompromised state.  Neurological: Positive for weakness and light-headedness. Negative for numbness.  Hematological: Does not bruise/bleed easily.  All other systems reviewed and are negative.    ____________________________________________  PHYSICAL EXAM:  VITAL SIGNS: ED Triage Vitals  Enc Vitals Group     BP 01/21/20 1711 123/62     Pulse Rate 01/21/20 1711 (!) 119     Resp 01/21/20 1711 18     Temp 01/21/20 1711 98.4 F (36.9 C)     Temp Source 01/21/20 1711 Oral     SpO2 01/21/20 1711 96 %     Weight 01/21/20 1712 212 lb (96.2 kg)     Height 01/21/20 1712 6\' 4"  (1.93 m)     Head Circumference --      Peak Flow --      Pain Score 01/21/20 1715 0     Pain Loc --      Pain Edu? --      Excl. in [place]? --      Physical Exam Vitals and nursing note reviewed.  Constitutional:      General: He is not in acute distress.    Appearance: He is well-developed.  HENT:     Head: Normocephalic and atraumatic.     Mouth/Throat:     Mouth: Mucous membranes are dry.  Eyes:     Conjunctiva/sclera: Conjunctivae normal.  Cardiovascular:     Rate and Rhythm: Tachycardia present. Rhythm irregular.     Heart sounds: Normal heart sounds.  Pulmonary:     Effort: Pulmonary effort is normal. No respiratory distress.     Breath sounds: No wheezing.  Abdominal:       General: There is no distension.  Musculoskeletal:     Cervical back: Neck supple.  Skin:    General: Skin is warm.     Capillary Refill: Capillary refill takes less than 2 seconds.     Findings: No rash.  Neurological:     Mental Status: He is alert and oriented to person, place, and time.     Motor: No abnormal muscle tone.       ____________________________________________   LABS (all labs ordered are listed, but only abnormal results are displayed)  Labs Reviewed  BASIC METABOLIC PANEL - Abnormal; Notable for the following components:      Result Value   CO2 21 (*)    Glucose, Bld 112 (*)    BUN 30 (*)    Creatinine, Ser 2.48 (*)    Calcium 7.9 (*)    GFR calc non Af Amer 26 (*)    GFR calc Af Amer 30 (*)    All other components within normal limits  CBC - Abnormal; Notable for the following components:   WBC 3.7 (*)    RBC 4.17 (*)    MCV 100.7 (*)    Platelets 103 (*)    All other components within normal limits  MAGNESIUM - Abnormal; Notable for the following components:   Magnesium 1.4 (*)    All other components within normal limits  TROPONIN I (HIGH SENSITIVITY) - Abnormal; Notable for the following components:   Troponin I (High Sensitivity) 26 (*)    All other components within normal limits  RESPIRATORY PANEL BY RT PCR (FLU A&B, COVID)  BRAIN NATRIURETIC PEPTIDE  LACTIC ACID, PLASMA  URINALYSIS, COMPLETE (UACMP) WITH MICROSCOPIC  TROPONIN I (HIGH SENSITIVITY)    ____________________________________________  EKG: A. fib RVR, rate 127.  QRS 98, QTc 470.  No acute ST elevations or depressions, nonspecific ST changes likely due to right. ________________________________________  RADIOLOGY All imaging, including plain films, CT scans, and ultrasounds, independently reviewed by me, and interpretations confirmed via formal radiology reads.  ED MD interpretation:   Chest x-ray: Cardiomegaly, no acute abnormality  Official radiology report(s): DG  Chest Portable 1 View  Result Date: 01/21/2020 CLINICAL DATA:  Weakness EXAM: PORTABLE CHEST 1 VIEW COMPARISON:  08/21/2014 FINDINGS: Cardiomegaly. No confluent airspace opacities or effusions. No edema. No acute bony abnormality. IMPRESSION: Cardiomegaly.  No acute cardiopulmonary disease. Electronically Signed   By: Rolm Baptise M.D.   On: 01/21/2020 18:40    ____________________________________________  PROCEDURES   Procedure(s) performed (including Critical Care):  Procedures  ____________________________________________  INITIAL IMPRESSION / MDM / [place] / ED COURSE  As part of my medical decision making, I reviewed the following data within the [place] notes reviewed and incorporated, Old chart reviewed, Notes from prior ED visits, and [place] Controlled Substance Database       *Duane [name] was evaluated in Emergency Department on 01/21/2020 for the symptoms described in the history of present illness. He was evaluated in the context of the global COVID-19 pandemic, which necessitated consideration that the patient might be at risk for infection with the SARS-CoV-2 virus that causes COVID-19. Institutional protocols and algorithms that pertain to the evaluation of patients at risk for COVID-19 are in a state of rapid change based on information released by regulatory bodies including the CDC and federal and state organizations. These policies and algorithms were followed during the patient's care in the ED.  Some ED evaluations and interventions may be delayed as a result of limited staffing during the pandemic.*     Medical Decision Making: 69 year old male here with generalized weakness and possible confusion.  On arrival, patient is noted to be in A. fib RVR.  He is severely weak with even light exertion.  EKG shows A. fib RVR but no overt ST elevations.  Lactic acid normal and he is afebrile without signs of infection.  Patient given  diltiazem bolus with transient improvement and he was ultimately placed on a drip.  Cautious fluids given as well.  Urinalysis pending.  Admit for generalized weakness with A. fib RVR and possible mild dehydration based on slight BUN to creatinine elevation above baseline.  ____________________________________________  FINAL CLINICAL IMPRESSION(S) / ED DIAGNOSES  Final diagnoses:  Atrial fibrillation with rapid ventricular response (HCC)  Hypomagnesemia     MEDICATIONS GIVEN DURING THIS VISIT:  Medications  diltiazem (CARDIZEM) 1 mg/mL load via infusion 10 mg (10 mg Intravenous Bolus from Bag 01/21/20 1933)    And  diltiazem (CARDIZEM) 125 mg in dextrose 5% 125 mL (1 mg/mL) infusion (5 mg/hr Intravenous New Bag/Given 01/21/20 1933)  magnesium sulfate IVPB 2 g 50 mL (has no administration in time range)  sodium chloride 0.9 % bolus 1,000 mL (1,000 mLs Intravenous New Bag/Given 01/21/20 1950)     ED Discharge Orders    None       Note:  This document was prepared using Dragon voice recognition software and may include unintentional dictation errors.   Duffy Bruce, MD 01/21/20 [id number]

## 2020-01-22 DIAGNOSIS — I4891 Unspecified atrial fibrillation: Secondary | ICD-10-CM | POA: Diagnosis present

## 2020-01-22 HISTORY — DX: Unspecified atrial fibrillation: I48.91

## 2020-01-22 LAB — C-REACTIVE PROTEIN
CRP: 2.8 mg/dL — ABNORMAL HIGH (ref <1.0)
CRP: 5.6 mg/dL — ABNORMAL HIGH (ref <1.0)

## 2020-01-22 LAB — COMPREHENSIVE METABOLIC PANEL
ALT: 15 U/L (ref 0–44)
AST: 34 U/L (ref 15–41)
Albumin: 3 g/dL — ABNORMAL LOW (ref 3.5–5.0)
Alkaline Phosphatase: 68 U/L (ref 38–126)
Anion gap: 8 (ref 5–15)
BUN: 37 mg/dL — ABNORMAL HIGH (ref 8–23)
CO2: 19 mmol/L — ABNORMAL LOW (ref 22–32)
Calcium: 7.7 mg/dL — ABNORMAL LOW (ref 8.9–10.3)
Chloride: 104 mmol/L (ref 98–111)
Creatinine, Ser: 2.85 mg/dL — ABNORMAL HIGH (ref 0.61–1.24)
GFR calc Af Amer: 25 mL/min — ABNORMAL LOW (ref >60)
GFR calc non Af Amer: 22 mL/min — ABNORMAL LOW (ref >60)
Glucose, Bld: 139 mg/dL — ABNORMAL HIGH (ref 70–99)
Potassium: 4.6 mmol/L (ref 3.5–5.1)
Sodium: 131 mmol/L — ABNORMAL LOW (ref 135–145)
Total Bilirubin: 0.7 mg/dL (ref 0.3–1.2)
Total Protein: 6.5 g/dL (ref 6.5–8.1)

## 2020-01-22 LAB — FERRITIN: Ferritin: 285 ng/mL (ref 24–336)

## 2020-01-22 LAB — CBC WITH DIFFERENTIAL/PLATELET
Abs Immature Granulocytes: 0.02 10*3/uL (ref 0.00–0.07)
Basophils Absolute: 0 10*3/uL (ref 0.0–0.1)
Basophils Relative: 0 %
Eosinophils Absolute: 0 10*3/uL (ref 0.0–0.5)
Eosinophils Relative: 0 %
HCT: 39.7 % (ref 39.0–52.0)
Hemoglobin: 12.7 g/dL — ABNORMAL LOW (ref 13.0–17.0)
Immature Granulocytes: 1 %
Lymphocytes Relative: 18 %
Lymphs Abs: 0.6 10*3/uL — ABNORMAL LOW (ref 0.7–4.0)
MCH: 32.2 pg (ref 26.0–34.0)
MCHC: 32 g/dL (ref 30.0–36.0)
MCV: 100.5 fL — ABNORMAL HIGH (ref 80.0–100.0)
Monocytes Absolute: 0.1 10*3/uL (ref 0.1–1.0)
Monocytes Relative: 2 %
Neutro Abs: 2.6 10*3/uL (ref 1.7–7.7)
Neutrophils Relative %: 79 %
Platelets: 98 10*3/uL — ABNORMAL LOW (ref 150–400)
RBC: 3.95 MIL/uL — ABNORMAL LOW (ref 4.22–5.81)
RDW: 13.8 % (ref 11.5–15.5)
WBC: 3.3 10*3/uL — ABNORMAL LOW (ref 4.0–10.5)
nRBC: 0 % (ref 0.0–0.2)

## 2020-01-22 LAB — MAGNESIUM: Magnesium: 2.2 mg/dL (ref 1.7–2.4)

## 2020-01-22 LAB — FIBRIN DERIVATIVES D-DIMER (ARMC ONLY): Fibrin derivatives D-dimer (ARMC): 335.85 ng/mL (FEU) (ref 0.00–499.00)

## 2020-01-22 LAB — HIV ANTIBODY (ROUTINE TESTING W REFLEX): HIV Screen 4th Generation wRfx: NONREACTIVE

## 2020-01-22 LAB — TSH: TSH: 0.987 u[IU]/mL (ref 0.350–4.500)

## 2020-01-22 MED ORDER — DILTIAZEM HCL ER COATED BEADS 120 MG PO CP24
120.0000 mg | ORAL_CAPSULE | Freq: Every day | ORAL | Status: DC
  Filled 2020-01-22 (×4): qty 1

## 2020-01-22 MED ORDER — SODIUM CHLORIDE 0.9 % IV SOLN
100.0000 mg | Freq: Every day | INTRAVENOUS | Status: DC
  Administered 2020-01-23 – 2020-01-24 (×2): 100 mg via INTRAVENOUS
  Filled 2020-01-22: qty 20
  Filled 2020-01-22: qty 100
  Filled 2020-01-22: qty 20

## 2020-01-22 MED ORDER — SODIUM CHLORIDE 0.9 % IV SOLN
200.0000 mg | Freq: Once | INTRAVENOUS | Status: AC
  Administered 2020-01-22: 03:00:00 200 mg via INTRAVENOUS
  Filled 2020-01-22: qty 40

## 2020-01-22 NOTE — ED Notes (Signed)
Patient assisted into hospital bed with new linens, stood at bedside to use the urinal. Patient has much more strength than he did earlier in the shift. Repositioned for comfort, patient denies other needs at this time

## 2020-01-22 NOTE — ED Notes (Signed)
NP messaged regarding bradycardia and wondered about possibility of reducing cardizem PO dose, held this AM due to bradycardia

## 2020-01-22 NOTE — ED Notes (Signed)
Patient is sleeping  

## 2020-01-22 NOTE — Progress Notes (Signed)
[name] [place] NAME: Duane [name]    MR#:  [id number]  DATE OF BIRTH:  02-20-1951  SUBJECTIVE:  CHIEF COMPLAINT:   Chief Complaint  Patient presents with   Weakness  Tired, coughing REVIEW OF SYSTEMS:  Review of Systems  Constitutional: Positive for malaise/fatigue. Negative for diaphoresis, fever and weight loss.  HENT: Negative for ear discharge, ear pain, hearing loss, nosebleeds, sore throat and tinnitus.   Eyes: Negative for blurred vision and pain.  Respiratory: Positive for cough. Negative for hemoptysis, shortness of breath and wheezing.   Cardiovascular: Negative for chest pain, palpitations, orthopnea and leg swelling.  Gastrointestinal: Negative for abdominal pain, blood in stool, constipation, diarrhea, heartburn, nausea and vomiting.  Genitourinary: Negative for dysuria, frequency and urgency.  Musculoskeletal: Negative for back pain and myalgias.  Skin: Negative for itching and rash.  Neurological: Negative for dizziness, tingling, tremors, focal weakness, seizures, weakness and headaches.  Psychiatric/Behavioral: Negative for depression. The patient is not nervous/anxious.    DRUG ALLERGIES:   Allergies  Allergen Reactions   Bactrim [Sulfamethoxazole-Trimethoprim] Other (See Comments)    Hyperkalemia   Chlorpromazine Other (See Comments)    Hiccups, hallucinations   Cyclobenzaprine Other (See Comments)    Sleeps for 8 hrs after taking it   Oxycodone Other (See Comments)    Excessive sleepiness   VITALS:  Blood pressure 114/72, pulse 63, temperature 98.4 F (36.9 C), temperature source Oral, resp. rate (!) 24, height 6\' 4"  (1.93 m), weight 96.2 kg, SpO2 96 %. PHYSICAL EXAMINATION:  Physical Exam HENT:     Head: Normocephalic and atraumatic.  Eyes:     Conjunctiva/sclera: Conjunctivae normal.     Pupils: Pupils are equal, round, and reactive to light.  Neck:     Thyroid: No thyromegaly.     Trachea: No  tracheal deviation.  Cardiovascular:     Rate and Rhythm: Normal rate and regular rhythm.     Heart sounds: Normal heart sounds.  Pulmonary:     Effort: Pulmonary effort is normal. No respiratory distress.     Breath sounds: Normal breath sounds. No wheezing.  Chest:     Chest wall: No tenderness.  Abdominal:     General: Bowel sounds are normal. There is no distension.     Palpations: Abdomen is soft.     Tenderness: There is no abdominal tenderness.  Musculoskeletal:        General: Normal range of motion.     Cervical back: Normal range of motion and neck supple.  Skin:    General: Skin is warm and dry.     Findings: No rash.  Neurological:     Mental Status: He is alert and oriented to person, place, and time.     Cranial Nerves: No cranial nerve deficit.    LABORATORY PANEL:  Male CBC Recent Labs  Lab 01/22/20 0406  WBC 3.3*  HGB 12.7*  HCT 39.7  PLT 98*   ------------------------------------------------------------------------------------------------------------------ Chemistries  Recent Labs  Lab 01/22/20 0406  NA 131*  K 4.6  CL 104  CO2 19*  GLUCOSE 139*  BUN 37*  CREATININE 2.85*  CALCIUM 7.7*  MG 2.2  AST 34  ALT 15  ALKPHOS 68  BILITOT 0.7   RADIOLOGY:  CT CHEST WO CONTRAST  Result Date: 01/21/2020 CLINICAL DATA:  69 year old male with shortness of breath, positive COVID-19. Possible cardiac arrhythmia. EXAM: CT CHEST WITHOUT CONTRAST TECHNIQUE: Multidetector CT imaging  of the chest was performed following the standard protocol without IV contrast. COMPARISON:  Portable chest earlier tonight. CT Abdomen and Pelvis 08/16/2014. FINDINGS: Cardiovascular: Vascular patency is not evaluated in the absence of IV contrast. Mild cardiomegaly. No pericardial effusion. Calcified coronary artery atherosclerosis and/or stents (series 2, image 79). Comparatively mild Calcified aortic atherosclerosis. Mediastinum/Nodes: Negative. No lymphadenopathy. Lungs/Pleura:  Major airways are patent. Minor dependent opacity at both costophrenic angles most resembles atelectasis, but there is widespread superimposed non dependent, patchy and regional ground-glass opacity in both lungs. There is more extensive involvement in the right lung. There is some superimposed bronchiectasis. No pleural effusion. Upper Abdomen: Negative visible noncontrast liver, gallbladder, spleen, pancreas, adrenal glands. Possible small gastric hiatal hernia but otherwise negative visible bowel. Musculoskeletal: Exaggerated thoracic kyphosis. Multilevel thoracic ankylosis related to flowing endplate osteophytes. No acute osseous abnormality identified. IMPRESSION: 1. Patchy and widely scattered bilateral pulmonary ground-glass opacity compatible with COVID-19 Pneumonia in this setting. 2. No other acute findings in the noncontrast chest. Superimposed mild bronchiectasis, dependent atelectasis. 3. Calcified coronary artery and Aortic Atherosclerosis (ICD10-I70.0). Electronically Signed   By: Genevie Ann M.D.   On: 01/21/2020 23:23   DG Chest Portable 1 View  Result Date: 01/21/2020 CLINICAL DATA:  Weakness EXAM: PORTABLE CHEST 1 VIEW COMPARISON:  08/21/2014 FINDINGS: Cardiomegaly. No confluent airspace opacities or effusions. No edema. No acute bony abnormality. IMPRESSION: Cardiomegaly.  No acute cardiopulmonary disease. Electronically Signed   By: Rolm Baptise M.D.   On: 01/21/2020 18:40   ASSESSMENT AND PLAN:  Duane [name]  is a 69 y.o. Caucasian male with a known history of atrial fibrillation, hypertension, hyper lipidemia, hypothyroidism and previous history of CVA, presented to emergency room acute onset of generalized weakness and lack of energy.  He started getting sick on Friday with cough as well as chills with low-grade fever of 99 without dyspnea or wheezing. He had postnasal drip with mild nasal congestion and feeling of choking.  Upon presentation to the emergency room, heart rate was 119  with otherwise normal vital signs.  Labs revealed a BUN of 30 and creatinine of 2.48 slightly worse than his previous levels and a CO2 of 21 with magnesium level of 1.4.  BNP was 60 and high-sensitivity troponin I was 26.  Lactic acid was 1.1.  CBC showed mild leukopenia of 3.7 down from 6.4 in October of last year.  It showed macrocytosis and thrombocytopenia 103 compared to 144 then.  His COVID-19 PCR came back positive and influenza antigens were negative.  Urinalysis was unremarkable except for 100 protein.  Alcohol levels less than 10.  Chest x-ray showed cardiomegaly with no acute cardiopulmonary disease. EKG initially showed atrial fibrillation with rapid ventricular sponsor 127.  The patient was given 10 mg of IV Cardizem bolus followed by drip.  Systolic blood pressure dropped to 88 at which time IV Cardizem drip was stopped.  He maintained heart rate in the 80s still in atrial fibrillation.  He was given IV remdesivir, 2 g of IV magnesium sulfate, 6 mg of IV Decadron and 1 L bolus of IV normal saline.  He will be admitted to a progressive cardiac bed for further evaluation and management.  1.  Paroxysmal atrial fibrillation with rapid ventricular response. -Continue p.o. Cardizem with holding parameters. - obtain a 2D echo and a cardiology consultation with Dr. Nehemiah Massed  -We will optimize his magnesium as hypomagnesemia could be contributing to his rapid ventricular response. -check his TSH  - continue his p.o. Eliquis.  2.  COVID-19. -IV remdesivir day 2/5 - monitor inflammatory markers and follow serial levels. - continue vitamin D as well as aspirin, zinc and vitamin C. -Steroid therapy will be continued with inflammatory markers elevation. - normal procalcitonin level  -Should his CRP came back elevated above 7, we will give him Actemra. -O2 protocol will be followed. -chest CT without contrast consistent with covid pna.  3.  Mild acute kidney injury superimposed on stage IIIb  chronic kidney disease. -continue IV normal saline and will follow serial BMPs.  4.  Gout. - continue allopurinol.  5.  Depression. - continue Prozac.  6.  DVT prophylaxis. -continue p.o. Eliquis.   Status is: Inpatient  Remains inpatient appropriate because:Inpatient level of care appropriate due to severity of illness   Dispo: The patient is from: Home              Anticipated d/c is to: Home              Anticipated d/c date is: > 3 days              Patient currently is not medically stable to d/c.   DVT prophylaxis: Eliquis Family Communication: Belenda Cruise (spouse) over Phone   All the records are reviewed and case discussed with Care Management/Social Worker. Management plans discussed with the patient, family and they are in agreement.  CODE STATUS: Full Code  TOTAL TIME TAKING CARE OF THIS PATIENT: 35 minutes.   More than 50% of the time was spent in counseling/coordination of care: YES  POSSIBLE D/C IN 3-4 DAYS, DEPENDING ON CLINICAL CONDITION.   Max Sane M.D on 01/22/2020 at 11:11 AM  [facility]   CC: Primary care physician; Delsa Grana, PA-C  Note: This dictation was prepared with Dragon dictation along with smaller phrase technology. Any transcriptional errors that result from this process are unintentional.

## 2020-01-22 NOTE — ED Notes (Signed)
Per NP, will inform day shift of bradycardia

## 2020-01-22 NOTE — ED Notes (Signed)
Report given to McKenzie, RN

## 2020-01-22 NOTE — Consult Note (Signed)
[facility] Consultation Note  Patient ID: Duane [name], MRN: [id number], DOB/AGE: 1951/05/19 69 y.o. Admit date: 01/21/2020   Date of Consult: 01/22/2020 Primary Physician: Delsa Grana, PA-C Primary Cardiologist: Ubaldo Glassing  Chief Complaint:  Chief Complaint  Patient presents with  . Weakness   Reason for Consult: Atrial fibrillation  HPI: 69 y.o. male with known chronic atrial fibrillation chronic kidney disease stage IV hypertension hyperlipidemia on appropriate medication management for these issues in the past.  The patient had a significant new shortness of breath cough congestion and weakness and fatigue and was seen in the emergency room.  At that time the patient did test positive for Covid and likely has had issues with this concern.  He had a chest x-ray which was normal and a troponin of 32.  There is been no evidence of true chest pain or congestive heart failure.  Atrial fibrillation had more rapid heart rate for which has been improved with medication management including diltiazem drip.  Current EKG shows atrial fibrillation with controlled ventricular rate at 70 bpm.  The patient is hemodynamically stable  Past Medical History:  Diagnosis Date  . A-fib [facility])    requiring life-long anticouagulation  . Aortic ectasia, abdominal ([place]) 05/22/2018   [place] Sept 2018  . Cerebellar infarct ([place])   . CKD (chronic kidney disease)    creatinine baseline 1.3-1.5 in 2015  . GERD (gastroesophageal reflux disease)   . Gout   . Graves disease   . H/O hyperkalemia    with Bactrim  . Hyperlipidemia    requiring life long statin therapy  . Hyperparathyroidism, secondary renal ([place]) 10/02/2017  . Hypertension   . Hypothyroidism   . Left hemiparesis [facility]) May 2015   s/p strke  . Poor short term memory   . Renal insufficiency   . Restrictive lung disease    mild  . Stroke ([place]) 02/20/14   left side hemiparesis, dysphagia  . UTI (urinary tract infection) due to  Enterococcus May 2015  . Vitamin D deficiency disease       Surgical History:  Past Surgical History:  Procedure Laterality Date  . CATARACT EXTRACTION W/PHACO Right 08/27/2016   Procedure: CATARACT EXTRACTION PHACO AND INTRAOCULAR LENS PLACEMENT (IOC);  Surgeon: Eulogio Bear, MD;  Location: [place];  Service: Ophthalmology;  Laterality: Right;  RIGHT  . ENTEROSTOMY CLOSURE  11/27/15   ileostomy takedown  . EXPLORATORY LAPAROTOMY  11/27/15  . illeostomy  07/2014  . POLYPECTOMY  10/15   GI surgery to remove polyp     Home Meds: Prior to Admission medications   Medication Sig Start Date End Date Taking? Authorizing Provider  allopurinol (ZYLOPRIM) 300 MG tablet Take 300 mg by mouth daily.   Yes [provider]  apixaban (ELIQUIS) 5 MG TABS tablet Take 1 tablet (5 mg total) by mouth 2 (two) times daily. 08/29/16  Yes Lada, Satira Anis, MD  calcitRIOL (ROCALTROL) 0.25 MCG capsule Take 0.25 mcg daily by mouth.  05/26/17  Yes [provider]  Cholecalciferol (VITAMIN D3) 2000 units capsule Take 2,000 Units by mouth daily.   Yes [provider]  cyanocobalamin 500 MCG tablet Take 500 mcg by mouth daily.   Yes [provider]  doxazosin (CARDURA) 1 MG tablet Take 1 tablet (1 mg total) by mouth daily. 09/17/19  Yes Delsa Grana, PA-C  famotidine (PEPCID) 20 MG tablet Take 1 tablet by mouth twice daily 03/25/19  Yes Hubbard Hartshorn, FNP  ferrous  sulfate 325 (65 FE) MG tablet Take 325 mg by mouth daily with breakfast.    Yes [provider]  FLUoxetine (PROZAC) 20 MG tablet Take 1 tablet (20 mg total) by mouth daily. 09/17/19  Yes Delsa Grana, PA-C  levothyroxine (SYNTHROID) 88 MCG tablet Take 1 tablet (88 mcg total) by mouth daily. 09/17/19  Yes Delsa Grana, PA-C  losartan (COZAAR) 25 MG tablet Take 1 tablet (25 mg total) by mouth daily. 09/17/19  Yes Delsa Grana, PA-C  methylphenidate (RITALIN) 5 MG tablet Take 1 tablet (5 mg total) by mouth  daily. 01/17/20  Yes Delsa Grana, PA-C  mirtazapine (REMERON) 15 MG tablet Take 1 tablet (15 mg total) by mouth at bedtime. 09/17/19  Yes Delsa Grana, PA-C  pravastatin (PRAVACHOL) 40 MG tablet Take 1 tablet (40 mg total) by mouth daily. 09/17/19  Yes Delsa Grana, PA-C  methylphenidate (RITALIN) 5 MG tablet Take 1 tablet (5 mg total) by mouth daily. If needed for attention or focus Patient not taking: Reported on 01/21/2020 09/17/19   Delsa Grana, PA-C  methylphenidate (RITALIN) 5 MG tablet Take 1 tablet (5 mg total) by mouth daily. Patient not taking: Reported on 01/21/2020 12/17/19   Delsa Grana, PA-C  methylphenidate (RITALIN) 5 MG tablet Take 1 tablet (5 mg total) by mouth daily. Patient not taking: Reported on 01/21/2020 02/16/20   Delsa Grana, PA-C    Inpatient Medications:  . allopurinol  100 mg Oral Daily  . apixaban  5 mg Oral BID  . vitamin C  500 mg Oral Daily  . aspirin EC  81 mg Oral Daily  . calcitRIOL  0.25 mcg Oral Daily  . cholecalciferol  2,000 Units Oral Daily  . dexamethasone (DECADRON) injection  6 mg Intravenous Q24H  . diltiazem  120 mg Oral Daily  . doxazosin  1 mg Oral Daily  . famotidine  20 mg Oral Daily  . ferrous sulfate  325 mg Oral TID WC  . FLUoxetine  20 mg Oral Daily  . guaiFENesin  600 mg Oral BID  . levothyroxine  88 mcg Oral Daily  . methylphenidate  5 mg Oral Daily  . mirtazapine  15 mg Oral QHS  . pravastatin  40 mg Oral Daily  . vitamin B-12  500 mcg Oral Daily  . zinc sulfate  220 mg Oral Daily   . sodium chloride 75 mL/hr at 01/22/20 0815  . [START ON 01/23/2020] remdesivir 100 mg in NS 100 mL      Allergies:  Allergies  Allergen Reactions  . Bactrim [Sulfamethoxazole-Trimethoprim] Other (See Comments)    Hyperkalemia  . Chlorpromazine Other (See Comments)    Hiccups, hallucinations  . Cyclobenzaprine Other (See Comments)    Sleeps for 8 hrs after taking it  . Oxycodone Other (See Comments)    Excessive sleepiness    Social History    Socioeconomic History  . Marital status: Married    Spouse name: Curt Bears  . Number of children: 2  . Years of education: Not on file  . Highest education level: 12th grade  Occupational History  . Occupation: Retired  Tobacco Use  . Smoking status: Never Smoker  . Smokeless tobacco: Never Used  . Tobacco comment: smoking cessation materials not required  Substance and Sexual Activity  . Alcohol use: No  . Drug use: No  . Sexual activity: Not Currently  Other Topics Concern  . Not on file  Social History Narrative  . Not on file   Social Determinants of  Health   Financial Resource Strain:   . Difficulty of Paying Living Expenses:   Food Insecurity:   . Worried About Charity fundraiser in the Last Year:   . Arboriculturist in the Last Year:   Transportation Needs:   . Film/video editor (Medical):   Marland Kitchen Lack of Transportation (Non-Medical):   Physical Activity:   . Days of Exercise per Week:   . Minutes of Exercise per Session:   Stress: No Stress Concern Present  . Feeling of Stress : Only a little  Social Connections: Unknown  . Frequency of Communication with Friends and Family: Not on file  . Frequency of Social Gatherings with Friends and Family: Not on file  . Attends Religious Services: Not on file  . Active Member of Clubs or Organizations: Not on file  . Attends Archivist Meetings: Not on file  . Marital Status: Married  Human resources officer Violence:   . Fear of Current or Ex-Partner:   . Emotionally Abused:   Marland Kitchen Physically Abused:   . Sexually Abused:      Family History  Problem Relation Age of Onset  . Arthritis Mother   . Heart disease Mother   . Heart attack Father   . Heart disease Father   . Cirrhosis Sister   . Hyperparathyroidism Neg Hx      Review of Systems Positive for shortness of breath cough congestion Negative for: General:  chills, fever, night sweats or weight changes.  Cardiovascular: PND orthopnea syncope  dizziness  Dermatological skin lesions rashes Respiratory: Positive for cough congestion Urologic: Frequent urination urination at night and hematuria Abdominal: negative for nausea, vomiting, diarrhea, bright red blood per rectum, melena, or hematemesis Neurologic: negative for visual changes, and/or hearing changes  All other systems reviewed and are otherwise negative except as noted above.  Labs: No results for input(s): CKTOTAL, CKMB, TROPONINI in the last 72 hours. Lab Results  Component Value Date   WBC 3.3 (L) 01/22/2020   HGB 12.7 (L) 01/22/2020   HCT 39.7 01/22/2020   MCV 100.5 (H) 01/22/2020   PLT 98 (L) 01/22/2020    Recent Labs  Lab 01/22/20 0406  NA 131*  K 4.6  CL 104  CO2 19*  BUN 37*  CREATININE 2.85*  CALCIUM 7.7*  PROT 6.5  BILITOT 0.7  ALKPHOS 68  ALT 15  AST 34  GLUCOSE 139*   Lab Results  Component Value Date   CHOL 185 09/17/2019   HDL 35 (L) 09/17/2019   LDLCALC 124 (H) 09/17/2019   TRIG 146 09/17/2019   No results found for: DDIMER  Radiology/Studies:  CT CHEST WO CONTRAST  Result Date: 01/21/2020 CLINICAL DATA:  69 year old male with shortness of breath, positive COVID-19. Possible cardiac arrhythmia. EXAM: CT CHEST WITHOUT CONTRAST TECHNIQUE: Multidetector CT imaging of the chest was performed following the standard protocol without IV contrast. COMPARISON:  Portable chest earlier tonight. CT Abdomen and Pelvis 08/16/2014. FINDINGS: Cardiovascular: Vascular patency is not evaluated in the absence of IV contrast. Mild cardiomegaly. No pericardial effusion. Calcified coronary artery atherosclerosis and/or stents (series 2, image 79). Comparatively mild Calcified aortic atherosclerosis. Mediastinum/Nodes: Negative. No lymphadenopathy. Lungs/Pleura: Major airways are patent. Minor dependent opacity at both costophrenic angles most resembles atelectasis, but there is widespread superimposed non dependent, patchy and regional ground-glass opacity in  both lungs. There is more extensive involvement in the right lung. There is some superimposed bronchiectasis. No pleural effusion. Upper Abdomen: Negative visible noncontrast liver,  gallbladder, spleen, pancreas, adrenal glands. Possible small gastric hiatal hernia but otherwise negative visible bowel. Musculoskeletal: Exaggerated thoracic kyphosis. Multilevel thoracic ankylosis related to flowing endplate osteophytes. No acute osseous abnormality identified. IMPRESSION: 1. Patchy and widely scattered bilateral pulmonary ground-glass opacity compatible with COVID-19 Pneumonia in this setting. 2. No other acute findings in the noncontrast chest. Superimposed mild bronchiectasis, dependent atelectasis. 3. Calcified coronary artery and Aortic Atherosclerosis (ICD10-I70.0). Electronically Signed   By: Genevie Ann M.D.   On: 01/21/2020 23:23   DG Chest Portable 1 View  Result Date: 01/21/2020 CLINICAL DATA:  Weakness EXAM: PORTABLE CHEST 1 VIEW COMPARISON:  08/21/2014 FINDINGS: Cardiomegaly. No confluent airspace opacities or effusions. No edema. No acute bony abnormality. IMPRESSION: Cardiomegaly.  No acute cardiopulmonary disease. Electronically Signed   By: Rolm Baptise M.D.   On: 01/21/2020 18:40    EKG: Atrial fibrillation with controlled ventricular rate  Weights: Filed Weights   01/21/20 1712 01/21/20 1715  Weight: 96.2 kg 96.2 kg     Physical Exam: Blood pressure 118/74, pulse 92, temperature 98.7 F (37.1 C), temperature source Oral, resp. rate 16, height 6\' 4"  (1.93 m), weight 96.2 kg, SpO2 96 %. Body mass index is 25.81 kg/m. As per prime doc    Assessment: 69 year old male with chronic kidney disease hypertension hyperlipidemia chronic atrial fibrillation with rapid rate likely secondary to current illness without congestive heart failure or myocardial infarction  Plan: 1.  Discontinuation of diltiazem drip and changing to a longer acting low-dose diltiazem for better heart rate control  with a heart rate between 60 and 90 bpm at rest.  Adjustments of this medication dosage as necessary 2.  Continue anticoagulation for further risk reduction in stroke with atrial fibrillation with Eliquis as before 3.  No further cardiac diagnostics necessary at this time due to no evidence of myocardial infarction or congestive heart failure 4.  Continue supportive care and treatment of Covid infection 5.  Begin ambulation and follow for improvements of symptoms and adjustments of medications as necessary  Signed, Corey Skains M.D. [facility] 01/22/2020, 2:07 PM

## 2020-01-22 NOTE — ED Notes (Signed)
Dr Manuella Ghazi notified of bradycardia

## 2020-01-23 ENCOUNTER — Inpatient Hospital Stay
Admit: 2020-01-23 | Discharge: 2020-01-23 | Disposition: A | Discharge status: Home / Self Care | Payer: Medicare HMO | Attending: Family Medicine | Admitting: Family Medicine

## 2020-01-23 DIAGNOSIS — J1282 Pneumonia due to coronavirus disease 2019: Secondary | ICD-10-CM

## 2020-01-23 LAB — CBC WITH DIFFERENTIAL/PLATELET
Abs Immature Granulocytes: 0.09 10*3/uL — ABNORMAL HIGH (ref 0.00–0.07)
Basophils Absolute: 0 10*3/uL (ref 0.0–0.1)
Basophils Relative: 0 %
Eosinophils Absolute: 0 10*3/uL (ref 0.0–0.5)
Eosinophils Relative: 0 %
HCT: 40.5 % (ref 39.0–52.0)
Hemoglobin: 13.2 g/dL (ref 13.0–17.0)
Immature Granulocytes: 1 %
Lymphocytes Relative: 8 %
Lymphs Abs: 0.8 10*3/uL (ref 0.7–4.0)
MCH: 32.2 pg (ref 26.0–34.0)
MCHC: 32.6 g/dL (ref 30.0–36.0)
MCV: 98.8 fL (ref 80.0–100.0)
Monocytes Absolute: 0.4 10*3/uL (ref 0.1–1.0)
Monocytes Relative: 4 %
Neutro Abs: 8.2 10*3/uL — ABNORMAL HIGH (ref 1.7–7.7)
Neutrophils Relative %: 87 %
Platelets: 112 10*3/uL — ABNORMAL LOW (ref 150–400)
RBC: 4.1 MIL/uL — ABNORMAL LOW (ref 4.22–5.81)
RDW: 14 % (ref 11.5–15.5)
WBC: 9.6 10*3/uL (ref 4.0–10.5)
nRBC: 0 % (ref 0.0–0.2)

## 2020-01-23 LAB — COMPREHENSIVE METABOLIC PANEL
ALT: 14 U/L (ref 0–44)
AST: 30 U/L (ref 15–41)
Albumin: 2.9 g/dL — ABNORMAL LOW (ref 3.5–5.0)
Alkaline Phosphatase: 63 U/L (ref 38–126)
Anion gap: 9 (ref 5–15)
BUN: 46 mg/dL — ABNORMAL HIGH (ref 8–23)
CO2: 18 mmol/L — ABNORMAL LOW (ref 22–32)
Calcium: 8.1 mg/dL — ABNORMAL LOW (ref 8.9–10.3)
Chloride: 110 mmol/L (ref 98–111)
Creatinine, Ser: 2.29 mg/dL — ABNORMAL HIGH (ref 0.61–1.24)
GFR calc Af Amer: 33 mL/min — ABNORMAL LOW (ref >60)
GFR calc non Af Amer: 28 mL/min — ABNORMAL LOW (ref >60)
Glucose, Bld: 121 mg/dL — ABNORMAL HIGH (ref 70–99)
Potassium: 4.8 mmol/L (ref 3.5–5.1)
Sodium: 137 mmol/L (ref 135–145)
Total Bilirubin: 0.7 mg/dL (ref 0.3–1.2)
Total Protein: 6.2 g/dL — ABNORMAL LOW (ref 6.5–8.1)

## 2020-01-23 LAB — C-REACTIVE PROTEIN: CRP: 3.3 mg/dL — ABNORMAL HIGH (ref <1.0)

## 2020-01-23 LAB — MAGNESIUM: Magnesium: 2.1 mg/dL (ref 1.7–2.4)

## 2020-01-23 LAB — ECHOCARDIOGRAM COMPLETE
Height: 76 in
Weight: 3392.02 oz

## 2020-01-23 LAB — FERRITIN: Ferritin: 336 ng/mL (ref 24–336)

## 2020-01-23 LAB — FIBRIN DERIVATIVES D-DIMER (ARMC ONLY): Fibrin derivatives D-dimer (ARMC): 354.93 ng/mL (FEU) (ref 0.00–499.00)

## 2020-01-23 NOTE — Progress Notes (Signed)
*  PRELIMINARY RESULTS* Echocardiogram 2D Echocardiogram has been performed.  Duane [name] [name] [name] 01/23/2020, 8:58 AM

## 2020-01-23 NOTE — Progress Notes (Signed)
Provider notified of decrease in pt hr . Am medications for a fib held secondary to decrease in hr

## 2020-01-23 NOTE — Plan of Care (Signed)
Verbalized understanding of current plan of care

## 2020-01-23 NOTE — Progress Notes (Signed)
[facility] Encounter Note  Patient: Duane [name] / Admit Date: 01/21/2020 / Date of Encounter: 01/23/2020, 7:52 AM   Subjective: Patient continues to have atrial fibrillation but much better heart rate control with oral diltiazem after discontinuation of intravenous diltiazem.  Patient is currently recovering from Covid infection.   Objective: Telemetry: Atrial fibrillation with controlled ventricular rate Physical Exam: Blood pressure 118/74, pulse 92, temperature 98.7 F (37.1 C), temperature source Oral, resp. rate 16, height 6\' 4"  (1.93 m), weight 96.2 kg, SpO2 96 %. Body mass index is 25.81 kg/m.  As per prime doc  Intake/Output Summary (Last 24 hours) at 01/23/2020 0752 Last data filed at 01/23/2020 0500 Gross per 24 hour  Intake 1266.93 ml  Output -  Net 1266.93 ml    Inpatient Medications:  . allopurinol  100 mg Oral Daily  . apixaban  5 mg Oral BID  . vitamin C  500 mg Oral Daily  . aspirin EC  81 mg Oral Daily  . calcitRIOL  0.25 mcg Oral Daily  . cholecalciferol  2,000 Units Oral Daily  . dexamethasone (DECADRON) injection  6 mg Intravenous Q24H  . diltiazem  120 mg Oral Daily  . doxazosin  1 mg Oral Daily  . famotidine  20 mg Oral Daily  . ferrous sulfate  325 mg Oral TID WC  . FLUoxetine  20 mg Oral Daily  . guaiFENesin  600 mg Oral BID  . levothyroxine  88 mcg Oral Daily  . methylphenidate  5 mg Oral Daily  . mirtazapine  15 mg Oral QHS  . pravastatin  40 mg Oral Daily  . vitamin B-12  500 mcg Oral Daily  . zinc sulfate  220 mg Oral Daily   Infusions:  . sodium chloride 75 mL/hr at 01/22/20 0815  . remdesivir 100 mg in NS 100 mL      Labs: Recent Labs    01/22/20 0406 01/23/20 0536  NA 131* 137  K 4.6 4.8  CL 104 110  CO2 19* 18*  GLUCOSE 139* 121*  BUN 37* 46*  CREATININE 2.85* 2.29*  CALCIUM 7.7* 8.1*  MG 2.2 2.1   Recent Labs    01/22/20 0406 01/23/20 0536  AST 34 30  ALT 15 14  ALKPHOS 68 63  BILITOT  0.7 0.7  PROT 6.5 6.2*  ALBUMIN 3.0* 2.9*   Recent Labs    01/22/20 0406 01/23/20 0536  WBC 3.3* 9.6  NEUTROABS 2.6 8.2*  HGB 12.7* 13.2  HCT 39.7 40.5  MCV 100.5* 98.8  PLT 98* 112*   No results for input(s): CKTOTAL, CKMB, TROPONINI in the last 72 hours. Invalid input(s): POCBNP No results for input(s): HGBA1C in the last 72 hours.   Weights: Filed Weights   01/21/20 1712 01/21/20 1715  Weight: 96.2 kg 96.2 kg     Radiology/Studies:  CT CHEST WO CONTRAST  Result Date: 01/21/2020 CLINICAL DATA:  69 year old male with shortness of breath, positive COVID-19. Possible cardiac arrhythmia. EXAM: CT CHEST WITHOUT CONTRAST TECHNIQUE: Multidetector CT imaging of the chest was performed following the standard protocol without IV contrast. COMPARISON:  Portable chest earlier tonight. CT Abdomen and Pelvis 08/16/2014. FINDINGS: Cardiovascular: Vascular patency is not evaluated in the absence of IV contrast. Mild cardiomegaly. No pericardial effusion. Calcified coronary artery atherosclerosis and/or stents (series 2, image 79). Comparatively mild Calcified aortic atherosclerosis. Mediastinum/Nodes: Negative. No lymphadenopathy. Lungs/Pleura: Major airways are patent. Minor dependent opacity at both costophrenic angles most resembles atelectasis, but there is  widespread superimposed non dependent, patchy and regional ground-glass opacity in both lungs. There is more extensive involvement in the right lung. There is some superimposed bronchiectasis. No pleural effusion. Upper Abdomen: Negative visible noncontrast liver, gallbladder, spleen, pancreas, adrenal glands. Possible small gastric hiatal hernia but otherwise negative visible bowel. Musculoskeletal: Exaggerated thoracic kyphosis. Multilevel thoracic ankylosis related to flowing endplate osteophytes. No acute osseous abnormality identified. IMPRESSION: 1. Patchy and widely scattered bilateral pulmonary ground-glass opacity compatible with  COVID-19 Pneumonia in this setting. 2. No other acute findings in the noncontrast chest. Superimposed mild bronchiectasis, dependent atelectasis. 3. Calcified coronary artery and Aortic Atherosclerosis (ICD10-I70.0). Electronically Signed   By: Genevie Ann M.D.   On: 01/21/2020 23:23   DG Chest Portable 1 View  Result Date: 01/21/2020 CLINICAL DATA:  Weakness EXAM: PORTABLE CHEST 1 VIEW COMPARISON:  08/21/2014 FINDINGS: Cardiomegaly. No confluent airspace opacities or effusions. No edema. No acute bony abnormality. IMPRESSION: Cardiomegaly.  No acute cardiopulmonary disease. Electronically Signed   By: Rolm Baptise M.D.   On: 01/21/2020 18:40     Assessment and Recommendation  69 y.o. male with known chronic kidney disease hypertension hyperlipidemia and chronic nonvalvular atrial fibrillation with rapid rate due to Covid infection now more controlled on oral diltiazem and stable with elevated troponin most consistent with demand ischemia rather than acute coronary syndrome 1.  Continue oral diltiazem at low dose for heart rate control with a goal heart rate between 60 and 90 bpm at rest 2.  Continue anticoagulation for further risk reduction in stroke with atrial fibrillation without change 3.  No further cardiac diagnostics necessary at this time 4.  Continuation of supportive care for Covid infection 5.  Okay for discharged home from cardiac standpoint when further improved from Covid infection with follow-up in 2 weeks for further adjustments of medication  Signed, Serafina Royals M.D. FACC

## 2020-01-23 NOTE — Progress Notes (Signed)
1        [place] [facility] [place] NAME: Duane [name]    MR#:  [id number]  DATE OF BIRTH:  [birth year]  SUBJECTIVE:  CHIEF COMPLAINT:   Chief Complaint  Patient presents with  . Weakness  Less coughing and shortness of breath.  Now at room air REVIEW OF SYSTEMS:  Review of Systems  Constitutional: Positive for malaise/fatigue. Negative for diaphoresis, fever and weight loss.  HENT: Negative for ear discharge, ear pain, hearing loss, nosebleeds, sore throat and tinnitus.   Eyes: Negative for blurred vision and pain.  Respiratory: Positive for cough. Negative for hemoptysis, shortness of breath and wheezing.   Cardiovascular: Negative for chest pain, palpitations, orthopnea and leg swelling.  Gastrointestinal: Negative for abdominal pain, blood in stool, constipation, diarrhea, heartburn, nausea and vomiting.  Genitourinary: Negative for dysuria, frequency and urgency.  Musculoskeletal: Negative for back pain and myalgias.  Skin: Negative for itching and rash.  Neurological: Negative for dizziness, tingling, tremors, focal weakness, seizures, weakness and headaches.  Psychiatric/Behavioral: Negative for depression. The patient is not nervous/anxious.    DRUG ALLERGIES:   Allergies  Allergen Reactions  . Bactrim [Sulfamethoxazole-Trimethoprim] Other (See Comments)    Hyperkalemia  . Chlorpromazine Other (See Comments)    Hiccups, hallucinations  . Cyclobenzaprine Other (See Comments)    Sleeps for 8 hrs after taking it  . Oxycodone Other (See Comments)    Excessive sleepiness   VITALS:  Blood pressure (!) 123/59, pulse 78, temperature 98.2 F (36.8 C), temperature source Axillary, resp. rate (!) 23, height 6\' 4"  (1.93 m), weight 96.2 kg, SpO2 97 %. PHYSICAL EXAMINATION:  Physical Exam HENT:     Head: Normocephalic and atraumatic.  Eyes:     Conjunctiva/sclera: Conjunctivae normal.     Pupils: Pupils are equal, round, and reactive to light.  Neck:     Thyroid: No thyromegaly.     Trachea: No tracheal deviation.  Cardiovascular:     Rate and Rhythm: Normal rate and regular rhythm.     Heart sounds: Normal heart sounds.  Pulmonary:     Effort: Pulmonary effort is normal. No respiratory distress.     Breath sounds: Normal breath sounds. No wheezing.  Chest:     Chest wall: No tenderness.  Abdominal:     General: Bowel sounds are normal. There is no distension.     Palpations: Abdomen is soft.     Tenderness: There is no abdominal tenderness.  Musculoskeletal:        General: Normal range of motion.     Cervical back: Normal range of motion and neck supple.  Skin:    General: Skin is warm and dry.     Findings: No rash.  Neurological:     Mental Status: He is alert and oriented to person, place, and time.     Cranial Nerves: No cranial nerve deficit.    LABORATORY PANEL:  Male CBC Recent Labs  Lab 01/23/20 0536  WBC 9.6  HGB 13.2  HCT 40.5  PLT 112*   ------------------------------------------------------------------------------------------------------------------ Chemistries  Recent Labs  Lab 01/23/20 0536  NA 137  K 4.8  CL 110  CO2 18*  GLUCOSE 121*  BUN 46*  CREATININE 2.29*  CALCIUM 8.1*  MG 2.1  AST 30  ALT 14  ALKPHOS 63  BILITOT 0.7   RADIOLOGY:  No results found. ASSESSMENT AND PLAN:  Duane [name]  is a 69 y.o. Caucasian male with a known history of atrial  fibrillation, hypertension, hyper lipidemia, hypothyroidism and previous history of CVA, presented to emergency room acute onset of generalized weakness and lack of energy.  He started getting sick on Friday with cough as well as chills with low-grade fever of 99 without dyspnea or wheezing. He had postnasal drip with mild nasal congestion and feeling of choking.  Upon presentation to the emergency room, heart rate was 119 with otherwise normal vital signs.  Labs revealed a BUN of 30 and creatinine of 2.48 slightly worse than his previous  levels and a CO2 of 21 with magnesium level of 1.4.  BNP was 60 and high-sensitivity troponin I was 26.  Lactic acid was 1.1.  CBC showed mild leukopenia of 3.7 down from 6.4 in October of last year.  It showed macrocytosis and thrombocytopenia 103 compared to 144 then.  His COVID-19 PCR came back positive and influenza antigens were negative.  Urinalysis was unremarkable except for 100 protein.  Alcohol levels less than 10.  Chest x-ray showed cardiomegaly with no acute cardiopulmonary disease. EKG initially showed atrial fibrillation with rapid ventricular sponsor 127.  The patient was given 10 mg of IV Cardizem bolus followed by drip.  Systolic blood pressure dropped to 88 at which time IV Cardizem drip was stopped.  He maintained heart rate in the 80s still in atrial fibrillation.  He was given IV remdesivir, 2 g of IV magnesium sulfate, 6 mg of IV Decadron and 1 L bolus of IV normal saline.  He will be admitted to a progressive cardiac bed for further evaluation and management.  1.  Paroxysmal atrial fibrillation with rapid ventricular response. -Continue p.o. Cardizem with holding parameters. -Pending 2D echo. appreciate Dr. Nehemiah Massed input - TSH within normal limits - continue his p.o. Eliquis.  2.  COVID-19. -IV remdesivir day 3/5 - monitor inflammatory markers and follow serial levels. - continue vitamin D as well as aspirin, zinc and vitamin C. -Steroid therapy will be continued with inflammatory markers elevation. - normal procalcitonin level  -O2 protocol will be followed. -chest CT without contrast consistent with covid pna.  3.  Mild acute kidney injury superimposed on stage IIIb chronic kidney disease. -continue IV normal saline and will follow serial BMPs.  4.  Gout. - continue allopurinol.  5.  Depression. - continue Prozac.  6.  DVT prophylaxis. -continue p.o. Eliquis.   Status is: Inpatient  Remains inpatient appropriate because:Inpatient level of care  appropriate due to severity of illness   Dispo: The patient is from: Home              Anticipated d/c is to: Home              Anticipated d/c date is: 2 days              Patient currently is not medically stable to d/c.   DVT prophylaxis: Eliquis Family Communication: Belenda Cruise (spouse) over Phone: 4/10   All the records are reviewed and case discussed with Care Management/Social Worker. Management plans discussed with the patient, family and they are in agreement.  CODE STATUS: Full Code  TOTAL TIME TAKING CARE OF THIS PATIENT: 35 minutes.   More than 50% of the time was spent in counseling/coordination of care: YES  POSSIBLE D/C IN 3 DAYS, DEPENDING ON CLINICAL CONDITION.   Max Sane M.D on 01/23/2020 at 12:02 PM  [facility]   CC: Primary care physician; Delsa Grana, PA-C  Note: This dictation was prepared with Dragon dictation along with smaller  Company secretary. Any transcriptional errors that result from this process are unintentional.

## 2020-01-24 LAB — CBC WITH DIFFERENTIAL/PLATELET
Abs Immature Granulocytes: 0.15 10*3/uL — ABNORMAL HIGH (ref 0.00–0.07)
Basophils Absolute: 0 10*3/uL (ref 0.0–0.1)
Basophils Relative: 0 %
Eosinophils Absolute: 0 10*3/uL (ref 0.0–0.5)
Eosinophils Relative: 0 %
HCT: 39.2 % (ref 39.0–52.0)
Hemoglobin: 12.8 g/dL — ABNORMAL LOW (ref 13.0–17.0)
Immature Granulocytes: 1 %
Lymphocytes Relative: 5 %
Lymphs Abs: 0.7 10*3/uL (ref 0.7–4.0)
MCH: 31.9 pg (ref 26.0–34.0)
MCHC: 32.7 g/dL (ref 30.0–36.0)
MCV: 97.8 fL (ref 80.0–100.0)
Monocytes Absolute: 0.3 10*3/uL (ref 0.1–1.0)
Monocytes Relative: 3 %
Neutro Abs: 12.7 10*3/uL — ABNORMAL HIGH (ref 1.7–7.7)
Neutrophils Relative %: 91 %
Platelets: 121 10*3/uL — ABNORMAL LOW (ref 150–400)
RBC: 4.01 MIL/uL — ABNORMAL LOW (ref 4.22–5.81)
RDW: 14 % (ref 11.5–15.5)
WBC: 13.9 10*3/uL — ABNORMAL HIGH (ref 4.0–10.5)
nRBC: 0 % (ref 0.0–0.2)

## 2020-01-24 LAB — COMPREHENSIVE METABOLIC PANEL
ALT: 15 U/L (ref 0–44)
AST: 31 U/L (ref 15–41)
Albumin: 2.8 g/dL — ABNORMAL LOW (ref 3.5–5.0)
Alkaline Phosphatase: 60 U/L (ref 38–126)
Anion gap: 6 (ref 5–15)
BUN: 49 mg/dL — ABNORMAL HIGH (ref 8–23)
CO2: 18 mmol/L — ABNORMAL LOW (ref 22–32)
Calcium: 8 mg/dL — ABNORMAL LOW (ref 8.9–10.3)
Chloride: 112 mmol/L — ABNORMAL HIGH (ref 98–111)
Creatinine, Ser: 2.15 mg/dL — ABNORMAL HIGH (ref 0.61–1.24)
GFR calc Af Amer: 35 mL/min — ABNORMAL LOW (ref >60)
GFR calc non Af Amer: 31 mL/min — ABNORMAL LOW (ref >60)
Glucose, Bld: 127 mg/dL — ABNORMAL HIGH (ref 70–99)
Potassium: 4.8 mmol/L (ref 3.5–5.1)
Sodium: 136 mmol/L (ref 135–145)
Total Bilirubin: 0.7 mg/dL (ref 0.3–1.2)
Total Protein: 6 g/dL — ABNORMAL LOW (ref 6.5–8.1)

## 2020-01-24 LAB — FIBRIN DERIVATIVES D-DIMER (ARMC ONLY): Fibrin derivatives D-dimer (ARMC): 347 ng/mL (FEU) (ref 0.00–499.00)

## 2020-01-24 LAB — FERRITIN: Ferritin: 290 ng/mL (ref 24–336)

## 2020-01-24 LAB — MAGNESIUM: Magnesium: 1.8 mg/dL (ref 1.7–2.4)

## 2020-01-24 LAB — C-REACTIVE PROTEIN: CRP: 1.4 mg/dL — ABNORMAL HIGH (ref <1.0)

## 2020-01-24 MED ORDER — LOPERAMIDE HCL 2 MG PO CAPS
2.0000 mg | ORAL_CAPSULE | Freq: Four times a day (QID) | ORAL | Status: DC | PRN
  Administered 2020-01-25: 11:00:00 2 mg via ORAL
  Filled 2020-01-24: qty 1

## 2020-01-24 NOTE — Care Management Important Message (Signed)
Important Message  Patient Details  Name: Duane [name] MRN: [id number] Date of Birth: 06-30-1951   Medicare Important Message Given:  Yes     Elease Hashimoto, LCSW 01/24/2020, 12:08 PM

## 2020-01-24 NOTE — Progress Notes (Signed)
1        [place] [facility] [place] NAME: Duane [name]    MR#:  [id number]  DATE OF BIRTH:  Apr 25, 1951  SUBJECTIVE:  CHIEF COMPLAINT:   Chief Complaint  Patient presents with  . Weakness  Less coughing and shortness of breath. on room air REVIEW OF SYSTEMS:  Review of Systems  Constitutional: Positive for malaise/fatigue. Negative for diaphoresis, fever and weight loss.  HENT: Negative for ear discharge, ear pain, hearing loss, nosebleeds, sore throat and tinnitus.   Eyes: Negative for blurred vision and pain.  Respiratory: Positive for cough. Negative for hemoptysis, shortness of breath and wheezing.   Cardiovascular: Negative for chest pain, palpitations, orthopnea and leg swelling.  Gastrointestinal: Negative for abdominal pain, blood in stool, constipation, diarrhea, heartburn, nausea and vomiting.  Genitourinary: Negative for dysuria, frequency and urgency.  Musculoskeletal: Negative for back pain and myalgias.  Skin: Negative for itching and rash.  Neurological: Negative for dizziness, tingling, tremors, focal weakness, seizures, weakness and headaches.  Psychiatric/Behavioral: Negative for depression. The patient is not nervous/anxious.    DRUG ALLERGIES:   Allergies  Allergen Reactions  . Bactrim [Sulfamethoxazole-Trimethoprim] Other (See Comments)    Hyperkalemia  . Chlorpromazine Other (See Comments)    Hiccups, hallucinations  . Cyclobenzaprine Other (See Comments)    Sleeps for 8 hrs after taking it  . Oxycodone Other (See Comments)    Excessive sleepiness   VITALS:  Blood pressure (!) 153/81, pulse 74, temperature 97.8 F (36.6 C), temperature source Oral, resp. rate 18, height 6\' 4"  (1.93 m), weight 96.2 kg, SpO2 97 %. PHYSICAL EXAMINATION:  Physical Exam HENT:     Head: Normocephalic and atraumatic.  Eyes:     Conjunctiva/sclera: Conjunctivae normal.     Pupils: Pupils are equal, round, and reactive to light.  Neck:     Thyroid:  No thyromegaly.     Trachea: No tracheal deviation.  Cardiovascular:     Rate and Rhythm: Normal rate and regular rhythm.     Heart sounds: Normal heart sounds.  Pulmonary:     Effort: Pulmonary effort is normal. No respiratory distress.     Breath sounds: Normal breath sounds. No wheezing.  Chest:     Chest wall: No tenderness.  Abdominal:     General: Bowel sounds are normal. There is no distension.     Palpations: Abdomen is soft.     Tenderness: There is no abdominal tenderness.  Musculoskeletal:        General: Normal range of motion.     Cervical back: Normal range of motion and neck supple.  Skin:    General: Skin is warm and dry.     Findings: No rash.  Neurological:     Mental Status: He is alert and oriented to person, place, and time.     Cranial Nerves: No cranial nerve deficit.    LABORATORY PANEL:  Male CBC Recent Labs  Lab 01/24/20 0515  WBC 13.9*  HGB 12.8*  HCT 39.2  PLT 121*   ------------------------------------------------------------------------------------------------------------------ Chemistries  Recent Labs  Lab 01/24/20 0515  NA 136  K 4.8  CL 112*  CO2 18*  GLUCOSE 127*  BUN 49*  CREATININE 2.15*  CALCIUM 8.0*  MG 1.8  AST 31  ALT 15  ALKPHOS 60  BILITOT 0.7   RADIOLOGY:  No results found. ASSESSMENT AND PLAN:  Duane [name]  is a 68 y.o. Caucasian male with a known history of atrial fibrillation, hypertension,  hyper lipidemia, hypothyroidism and previous history of CVA, presented to emergency room acute onset of generalized weakness and lack of energy.  He started getting sick on Friday with cough as well as chills with low-grade fever of 99 without dyspnea or wheezing. He had postnasal drip with mild nasal congestion and feeling of choking.  Upon presentation to the emergency room, heart rate was 119 with otherwise normal vital signs.  Labs revealed a BUN of 30 and creatinine of 2.48 slightly worse than his previous levels and  a CO2 of 21 with magnesium level of 1.4.  BNP was 60 and high-sensitivity troponin I was 26.  Lactic acid was 1.1.  CBC showed mild leukopenia of 3.7 down from 6.4 in October of last year.  It showed macrocytosis and thrombocytopenia 103 compared to 144 then.  His COVID-19 PCR came back positive and influenza antigens were negative.  Urinalysis was unremarkable except for 100 protein.  Alcohol levels less than 10.  Chest x-ray showed cardiomegaly with no acute cardiopulmonary disease. EKG initially showed atrial fibrillation with rapid ventricular sponsor 127.  The patient was given 10 mg of IV Cardizem bolus followed by drip.  Systolic blood pressure dropped to 88 at which time IV Cardizem drip was stopped.  He maintained heart rate in the 80s still in atrial fibrillation.  He was given IV remdesivir, 2 g of IV magnesium sulfate, 6 mg of IV Decadron and 1 L bolus of IV normal saline.  He will be admitted to a progressive cardiac bed for further evaluation and management.  1.  Paroxysmal atrial fibrillation with rapid ventricular response. -Continue p.o. Cardizem with holding parameters. -2D echo EF 45 to 50%. appreciate Dr. Nehemiah Massed input - TSH within normal limits - continue his p.o. Eliquis.  2.  COVID-19. -IV remdesivir day 4/5 - monitor inflammatory markers and follow serial levels. - continue vitamin D as well as aspirin, zinc and vitamin C. -Steroid therapy will be continued with inflammatory markers elevation. - normal procalcitonin level  -O2 protocol will be followed. -chest CT without contrast consistent with covid pna.  3.  Mild acute kidney injury superimposed on stage IIIb chronic kidney disease. -Stop IV fluids.  Creatinine close to his baseline.  2.15 today  4.  Gout. - continue allopurinol.  5.  Depression. - continue Prozac.  6.  DVT prophylaxis. -continue p.o. Eliquis.  PT/OT evaluation  Status is: Inpatient  Remains inpatient appropriate because:Inpatient  level of care appropriate due to severity of illness   Dispo: The patient is from: Home              Anticipated d/c is to: Home              Anticipated d/c date is: 1 day              Patient currently is not medically stable to d/c.   DVT prophylaxis: Eliquis Family Communication: Belenda Cruise (spouse) over Phone: 4/12   All the records are reviewed and case discussed with Care Management/Social Worker. Management plans discussed with the patient, family and they are in agreement.  CODE STATUS: Full Code  TOTAL TIME TAKING CARE OF THIS PATIENT: 35 minutes.   More than 50% of the time was spent in counseling/coordination of care: YES  POSSIBLE D/C IN 1 DAYS, DEPENDING ON CLINICAL CONDITION.   Max Sane M.D on 01/24/2020 at 1:31 PM  [facility]   CC: Primary care physician; Delsa Grana, PA-C  Note: This dictation was prepared  with Dragon dictation along with smaller phrase technology. Any transcriptional errors that result from this process are unintentional.

## 2020-01-25 DIAGNOSIS — U071 COVID-19: Secondary | ICD-10-CM

## 2020-01-25 LAB — COMPREHENSIVE METABOLIC PANEL
ALT: 15 U/L (ref 0–44)
AST: 30 U/L (ref 15–41)
Albumin: 2.9 g/dL — ABNORMAL LOW (ref 3.5–5.0)
Alkaline Phosphatase: 71 U/L (ref 38–126)
Anion gap: 6 (ref 5–15)
BUN: 51 mg/dL — ABNORMAL HIGH (ref 8–23)
CO2: 18 mmol/L — ABNORMAL LOW (ref 22–32)
Calcium: 8.3 mg/dL — ABNORMAL LOW (ref 8.9–10.3)
Chloride: 115 mmol/L — ABNORMAL HIGH (ref 98–111)
Creatinine, Ser: 2.04 mg/dL — ABNORMAL HIGH (ref 0.61–1.24)
GFR calc Af Amer: 38 mL/min — ABNORMAL LOW (ref >60)
GFR calc non Af Amer: 33 mL/min — ABNORMAL LOW (ref >60)
Glucose, Bld: 98 mg/dL (ref 70–99)
Potassium: 4.6 mmol/L (ref 3.5–5.1)
Sodium: 139 mmol/L (ref 135–145)
Total Bilirubin: 0.7 mg/dL (ref 0.3–1.2)
Total Protein: 6.1 g/dL — ABNORMAL LOW (ref 6.5–8.1)

## 2020-01-25 LAB — FERRITIN: Ferritin: 255 ng/mL (ref 24–336)

## 2020-01-25 LAB — CBC WITH DIFFERENTIAL/PLATELET
Abs Immature Granulocytes: 0.13 10*3/uL — ABNORMAL HIGH (ref 0.00–0.07)
Basophils Absolute: 0 10*3/uL (ref 0.0–0.1)
Basophils Relative: 0 %
Eosinophils Absolute: 0 10*3/uL (ref 0.0–0.5)
Eosinophils Relative: 0 %
HCT: 41.7 % (ref 39.0–52.0)
Hemoglobin: 13.7 g/dL (ref 13.0–17.0)
Immature Granulocytes: 1 %
Lymphocytes Relative: 6 %
Lymphs Abs: 0.9 10*3/uL (ref 0.7–4.0)
MCH: 31.9 pg (ref 26.0–34.0)
MCHC: 32.9 g/dL (ref 30.0–36.0)
MCV: 97.2 fL (ref 80.0–100.0)
Monocytes Absolute: 0.6 10*3/uL (ref 0.1–1.0)
Monocytes Relative: 4 %
Neutro Abs: 12.6 10*3/uL — ABNORMAL HIGH (ref 1.7–7.7)
Neutrophils Relative %: 89 %
Platelets: 142 10*3/uL — ABNORMAL LOW (ref 150–400)
RBC: 4.29 MIL/uL (ref 4.22–5.81)
RDW: 14.4 % (ref 11.5–15.5)
WBC: 14.2 10*3/uL — ABNORMAL HIGH (ref 4.0–10.5)
nRBC: 0 % (ref 0.0–0.2)

## 2020-01-25 LAB — C-REACTIVE PROTEIN: CRP: 1 mg/dL — ABNORMAL HIGH (ref <1.0)

## 2020-01-25 LAB — FIBRIN DERIVATIVES D-DIMER (ARMC ONLY): Fibrin derivatives D-dimer (ARMC): 370.7 ng/mL (FEU) (ref 0.00–499.00)

## 2020-01-25 LAB — MAGNESIUM: Magnesium: 1.7 mg/dL (ref 1.7–2.4)

## 2020-01-25 MED ORDER — ASCORBIC ACID 500 MG PO TABS: 500.0000 mg | ORAL_TABLET | Freq: Every day | ORAL | 0 refills | Status: DC

## 2020-01-25 MED ORDER — ZINC SULFATE 220 (50 ZN) MG PO CAPS: 220.0000 mg | ORAL_CAPSULE | Freq: Every day | ORAL | 0 refills | Status: DC

## 2020-01-25 MED ORDER — DILTIAZEM HCL ER COATED BEADS 120 MG PO CP24: 120.0000 mg | ORAL_CAPSULE | Freq: Every day | ORAL | 0 refills | Status: DC

## 2020-01-25 NOTE — Progress Notes (Signed)
Please keep follow up appointment. DO NOT take Diltiazem, Cardura until follow up with Cardiologist

## 2020-01-25 NOTE — Discharge Instructions (Signed)
Person Under Monitoring Name: Duane [name]  Location: [place] [address]   CORONAVIRUS DISEASE 2019 (COVID-19) Guidance for Persons Under Investigation You are being tested for the virus that causes coronavirus disease 2019 (COVID-19). Public health actions are necessary to ensure protection of your health and the health of others, and to prevent further spread of infection. COVID-19 is caused by a virus that can cause symptoms, such as fever, cough, and shortness of breath. The primary transmission from person to person is by coughing or sneezing. On November 12, 2018, the [place] announced a [employer] Emergency of International Concern and on November 13, 2018 the U.S. Department of Health and Human Services declared a public health emergency. If the virus that causesCOVID-19 spreads in the community, it could have severe public health consequences.  As a person under investigation for COVID-19, the [place] advises you to adhere to the following guidance until your test results are reported to you. If your test result is positive, you will receive additional information from your provider and your local health department at that time.   Remain at home until you are cleared by your health provider or public health authorities.   Keep a log of visitors to your home using the form provided. Any visitors to your home must be aware of your isolation status.  If you plan to move to a new address or leave the county, notify the local health department in your county.  Call a doctor or seek care if you have an urgent medical need. Before seeking medical care, call ahead and get instructions from the provider before arriving at the medical office, clinic or hospital. Notify them that you are being tested for the virus that causes COVID-19 so arrangements can be made, as  necessary, to prevent transmission to others in the healthcare setting. Next, notify the local health department in your county.  If a medical emergency arises and you need to call 911, inform the first responders that you are being tested for the virus that causes COVID-19. Next, notify the local health department in your county.  Adhere to all guidance set forth by the [place] for [facility] of patients that is based on guidance from the Center for Disease Control and Prevention with suspected or confirmed COVID-19. It is provided with this guidance for Persons Under Investigation.  Your health and the health of our community are our top priorities. Public Health officials remain available to provide assistance and counseling to you about COVID-19 and compliance with this guidance.  Provider: ____________________________________________________________ Date: ______/_____/_________  By signing below, you acknowledge that you have read and agree to comply with this Guidance for Persons Under Investigation. ______________________________________________________________ Date: ______/_____/_________  WHO DO I CALL? You can find a list of local health departments here: [email or web address] Health Department: ____________________________________________________________________ Contact Name: ________________________________________________________________________ Telephone: ___________________________________________________________________________  Marice Potter, [place], Communicable Disease Branch COVID-19 Guidance for Persons Under Investigation December 19, 2018   Person Under Monitoring Name: Duane [name]  Location: [place] [address]   Infection Prevention Recommendations for Individuals Confirmed to have, or Being Evaluated for, 2019 Novel Coronavirus  (COVID-19) Infection Who Receive Care at Home  Individuals who are confirmed to have, or are being evaluated for, COVID-19 should follow the prevention steps below until a healthcare provider or local  or state health department says they can return to normal activities.  Stay home except to get medical care You should restrict activities outside your home, except for getting medical care. Do not go to work, school, or public areas, and do not use public transportation or taxis.  Call ahead before visiting your doctor Before your medical appointment, call the healthcare provider and tell them that you have, or are being evaluated for, COVID-19 infection. This will help the healthcare provider's office take steps to keep other people from getting infected. Ask your healthcare provider to call the local or state health department.  Monitor your symptoms Seek prompt medical attention if your illness is worsening (e.g., difficulty breathing). Before going to your medical appointment, call the healthcare provider and tell them that you have, or are being evaluated for, COVID-19 infection. Ask your healthcare provider to call the local or state health department.  Wear a facemask You should wear a facemask that covers your nose and mouth when you are in the same room with other people and when you visit a healthcare provider. People who live with or visit you should also wear a facemask while they are in the same room with you.  Separate yourself from other people in your home As much as possible, you should stay in a different room from other people in your home. Also, you should use a separate bathroom, if available.  Avoid sharing household items You should not share dishes, drinking glasses, cups, eating utensils, towels, bedding, or other items with other people in your home. After using these items, you should wash them thoroughly with soap and water.  Cover your coughs and  sneezes Cover your mouth and nose with a tissue when you cough or sneeze, or you can cough or sneeze into your sleeve. Throw used tissues in a lined trash can, and immediately wash your hands with soap and water for at least 20 seconds or use an alcohol-based hand rub.  Wash your [facility] your hands often and thoroughly with soap and water for at least 20 seconds. You can use an alcohol-based hand sanitizer if soap and water are not available and if your hands are not visibly dirty. Avoid touching your eyes, nose, and mouth with unwashed hands.   Prevention Steps for Caregivers and Household Members of Individuals Confirmed to have, or Being Evaluated for, COVID-19 Infection Being Cared for in the Home  If you live with, or provide care at home for, a person confirmed to have, or being evaluated for, COVID-19 infection please follow these guidelines to prevent infection:  Follow healthcare provider's instructions Make sure that you understand and can help the patient follow any healthcare provider instructions for all care.  Provide for the patient's basic needs You should help the patient with basic needs in the home and provide support for getting groceries, prescriptions, and other personal needs.  Monitor the patient's symptoms If they are getting sicker, call his or her medical provider and tell them that the patient has, or is being evaluated for, COVID-19 infection. This will help the healthcare provider's office take steps to keep other people from getting infected. Ask the healthcare provider to call the local or state health department.  Limit the number of people who have contact with the patient  If possible, have only one caregiver for the patient.  Other household members should stay in another home or place of residence. If this is not possible, they should stay  in  another room, or be separated from the patient as much as possible. Use a separate bathroom, if  available.  Restrict visitors who do not have an essential need to be in the home.  Keep older adults, very young children, and other sick people away from the patient Keep older adults, very young children, and those who have compromised immune systems or chronic health conditions away from the patient. This includes people with chronic heart, lung, or kidney conditions, diabetes, and cancer.  Ensure good ventilation Make sure that shared spaces in the home have good air flow, such as from an air conditioner or an opened window, weather permitting.  Wash your hands often  Wash your hands often and thoroughly with soap and water for at least 20 seconds. You can use an alcohol based hand sanitizer if soap and water are not available and if your hands are not visibly dirty.  Avoid touching your eyes, nose, and mouth with unwashed hands.  Use disposable paper towels to dry your hands. If not available, use dedicated cloth towels and replace them when they become wet.  Wear a facemask and gloves  Wear a disposable facemask at all times in the room and gloves when you touch or have contact with the patient's blood, body fluids, and/or secretions or excretions, such as sweat, saliva, sputum, nasal mucus, vomit, urine, or feces.  Ensure the mask fits over your nose and mouth tightly, and do not touch it during use.  Throw out disposable facemasks and gloves after using them. Do not reuse.  Wash your hands immediately after removing your facemask and gloves.  If your personal clothing becomes contaminated, carefully remove clothing and launder. Wash your hands after handling contaminated clothing.  Place all used disposable facemasks, gloves, and other waste in a lined container before disposing them with other household waste.  Remove gloves and wash your hands immediately after handling these items.  Do not share dishes, glasses, or other household items with the patient  Avoid sharing  household items. You should not share dishes, drinking glasses, cups, eating utensils, towels, bedding, or other items with a patient who is confirmed to have, or being evaluated for, COVID-19 infection.  After the person uses these items, you should wash them thoroughly with soap and water.  Wash laundry thoroughly  Immediately remove and wash clothes or bedding that have blood, body fluids, and/or secretions or excretions, such as sweat, saliva, sputum, nasal mucus, vomit, urine, or feces, on them.  Wear gloves when handling laundry from the patient.  Read and follow directions on labels of laundry or clothing items and detergent. In general, wash and dry with the warmest temperatures recommended on the label.  Clean all areas the individual has used often  Clean all touchable surfaces, such as counters, tabletops, doorknobs, bathroom fixtures, toilets, phones, keyboards, tablets, and bedside tables, every day. Also, clean any surfaces that may have blood, body fluids, and/or secretions or excretions on them.  Wear gloves when cleaning surfaces the patient has come in contact with.  Use a diluted bleach solution (e.g., dilute bleach with 1 part bleach and 10 parts water) or a household disinfectant with a label that says EPA-registered for coronaviruses. To make a bleach solution at home, add 1 tablespoon of bleach to 1 quart (4 cups) of water. For a larger supply, add  cup of bleach to 1 gallon (16 cups) of water.  Read labels of cleaning products and follow recommendations provided on product labels. Labels  contain instructions for safe and effective use of the cleaning product including precautions you should take when applying the product, such as wearing gloves or eye protection and making sure you have good ventilation during use of the product.  Remove gloves and wash hands immediately after cleaning.  Monitor yourself for signs and symptoms of illness Caregivers and household  members are considered close contacts, should monitor their health, and will be asked to limit movement outside of the home to the extent possible. Follow the monitoring steps for close contacts listed on the symptom monitoring form.   ? If you have additional questions, contact your local health department or call the epidemiologist on call at [phone] (available 24/7). ? This guidance is subject to change. For the most up-to-date guidance from [facility], please refer to their website: [email or web address]://www.taylor.[email or web address]: Quarantine vs. Isolation QUARANTINE keeps someone who was in close contact with someone who has COVID-19 away from others. If you had close contact with a person who has COVID-19  Stay home until 14 days after your last contact.  Check your temperature twice a day and watch for symptoms of COVID-19.  If possible, stay away from people who are at higher-risk for getting very sick from COVID-19. ISOLATION keeps someone who is sick or tested positive for COVID-19 without symptoms away from others, even in their own home. If you are sick and think or know you have COVID-19  Stay home until after ? At least 10 days since symptoms first appeared and ? At least 24 hours with no fever without fever-reducing medication and ? Symptoms have improved If you tested positive for COVID-19 but do not have symptoms  Stay home until after ? 10 days have passed since your positive test If you live with others, stay in a specific "sick room" or area and away from other people or animals, including pets. Use a separate bathroom, if available. [email or web address] 05/03/2019 This information is not intended to replace advice given to you by your health care provider. Make sure you discuss any questions you have with your health care provider. Document Revised: 09/16/2019 Document Reviewed: 09/16/2019 Elsevier Patient Education  [place]. COVID-19: How to Protect Yourself and Others Know how it spreads  There is currently no vaccine to prevent coronavirus disease 2019 (COVID-19).  The best way to prevent illness is to avoid being exposed to this virus.  The virus is thought to spread mainly from person-to-person. ? Between people who are in close contact with one another (within about 6 feet). ? Through respiratory droplets produced when an infected person coughs, sneezes or talks. ? These droplets can land in the mouths or noses of people who are nearby or possibly be inhaled into the lungs. ? COVID-19 may be spread by people who are not showing symptoms. Everyone should Clean your hands often  Wash your hands often with soap and water for at least 20 seconds especially after you have been in a public place, or after blowing your nose, coughing, or sneezing.  If soap and water are not readily available, use a hand sanitizer that contains at least 60% alcohol. Cover all surfaces of your hands and rub them together until they feel dry.  Avoid touching your eyes, nose, and mouth with unwashed hands. Avoid close contact  Limit contact with others as much as possible.  Avoid close contact with people who are sick.  Put distance between yourself and other people. ? Remember that some people without symptoms may  be able to spread virus. ? This is especially important for people who are at higher risk of getting very GainPain.com.cy Cover your mouth and nose with a mask when around others  You could spread COVID-19 to others even if you do not feel sick.  Everyone should wear a mask in public settings and when around people not living in their household, especially when social distancing is difficult to maintain. ? Masks should not be placed on young children under age 57, anyone who has trouble breathing, or is unconscious, incapacitated or  otherwise unable to remove the mask without assistance.  The mask is meant to protect other people in case you are infected.  Do NOT use a facemask meant for a Dietitian.  Continue to keep about 6 feet between yourself and others. The mask is not a substitute for social distancing. Cover coughs and sneezes  Always cover your mouth and nose with a tissue when you cough or sneeze or use the inside of your elbow.  Throw used tissues in the trash.  Immediately wash your hands with soap and water for at least 20 seconds. If soap and water are not readily available, clean your hands with a hand sanitizer that contains at least 60% alcohol. Clean and disinfect  Clean AND disinfect frequently touched surfaces daily. This includes tables, doorknobs, light switches, countertops, handles, desks, phones, keyboards, toilets, faucets, and sinks. [email or web address]  If surfaces are dirty, clean them: Use detergent or soap and water prior to disinfection.  Then, use a household disinfectant. You can see a list of EPA-registered household disinfectants here. [email or web address] 06/16/2019 This information is not intended to replace advice given to you by your health care provider. Make sure you discuss any questions you have with your health care provider. Document Revised: 06/24/2019 Document Reviewed: 04/22/2019 Elsevier Patient Education  [place]. COVID-19 Frequently Asked Questions COVID-19 (coronavirus disease) is an infection that is caused by a large family of viruses. Some viruses cause illness in people and others cause illness in animals like camels, cats, and bats. In some cases, the viruses that cause illness in animals can spread to humans. Where did the coronavirus come from? In December 2019, [place] told the [facility]) of several cases of lung disease (human respiratory illness). These  cases were linked to an open seafood and livestock market in the city of [place]. The link to the seafood and livestock market suggests that the virus may have spread from animals to humans. However, since that first outbreak in December, the virus has also been shown to spread from person to person. What is the name of the disease and the virus? Disease name Early on, this disease was called novel coronavirus. This is because scientists determined that the disease was caused by a new (novel) respiratory virus. The World Health Organization [employer]) has now named the disease COVID-19, or coronavirus disease. Virus name The virus that causes the disease is called severe acute respiratory syndrome coronavirus 2 (SARS-CoV-2). More information on disease and virus naming World Health Organization [facility]): www.who.int/emergencies/diseases/novel-coronavirus-2019/technical-guidance/naming-the-coronavirus-disease-(covid-2019)-and-the-virus-that-causes-it Who is at risk for complications from coronavirus disease? Some people may be at higher risk for complications from coronavirus disease. This includes older adults and people who have chronic diseases, such as heart disease, diabetes, and lung disease. If you are at higher risk for complications, take these extra precautions:  Stay home as much as possible.  Avoid social gatherings and travel.  Avoid close contact with others. Stay at  least 6 ft (2 m) away from others, if possible.  Wash your hands often with soap and water for at least 20 seconds.  Avoid touching your face, mouth, nose, or eyes.  Keep supplies on hand at home, such as food, medicine, and cleaning supplies.  If you must go out in public, wear a cloth face covering or face mask. Make sure your mask covers your nose and mouth. How does coronavirus disease spread? The virus that causes coronavirus disease spreads easily from person to person (is contagious). You may catch the virus  by:  Breathing in droplets from an infected person. Droplets can be spread by a person breathing, speaking, singing, coughing, or sneezing.  Touching something, like a table or a doorknob, that was exposed to the virus (contaminated) and then touching your mouth, nose, or eyes. Can I get the virus from touching surfaces or objects? There is still a lot that we do not know about the virus that causes coronavirus disease. Scientists are basing a lot of information on what they know about similar viruses, such as:  Viruses cannot generally survive on surfaces for long. They need a human body (host) to survive.  It is more likely that the virus is spread by close contact with people who are sick (direct contact), such as through: ? Shaking hands or hugging. ? Breathing in respiratory droplets that travel through the air. Droplets can be spread by a person breathing, speaking, singing, coughing, or sneezing.  It is less likely that the virus is spread when a person touches a surface or object that has the virus on it (indirect contact). The virus may be able to enter the body if the person touches a surface or object and then touches his or her face, eyes, nose, or mouth. Can a person spread the virus without having symptoms of the disease? It may be possible for the virus to spread before a person has symptoms of the disease, but this is most likely not the main way the virus is spreading. It is more likely for the virus to spread by being in close contact with people who are sick and breathing in the respiratory droplets spread by a person breathing, speaking, singing, coughing, or sneezing. What are the symptoms of coronavirus disease? Symptoms vary from person to person and can range from mild to severe. Symptoms may include:  Fever or chills.  Cough.  Difficulty breathing or feeling short of breath.  Headaches, body aches, or muscle aches.  Runny or stuffy (congested) nose.  Sore  throat.  New loss of taste or smell.  Nausea, vomiting, or diarrhea. These symptoms can appear anywhere from 2 to 14 days after you have been exposed to the virus. Some people may not have any symptoms. If you develop symptoms, call your health care provider. People with severe symptoms may need hospital care. Should I be tested for this virus? Your health care provider will decide whether to test you based on your symptoms, history of exposure, and your risk factors. How does a health care provider test for this virus? Health care providers will collect samples to send for testing. Samples may include:  Taking a swab of fluid from the back of your nose and throat, your nose, or your throat.  Taking fluid from the lungs by having you cough up mucus (sputum) into a sterile cup.  Taking a blood sample. Is there a treatment or vaccine for this virus? Currently, there is no vaccine to  prevent coronavirus disease. Also, there are no medicines like antibiotics or antivirals to treat the virus. A person who becomes sick is given supportive care, which means rest and fluids. A person may also relieve his or her symptoms by using over-the-counter medicines that treat sneezing, coughing, and runny nose. These are the same medicines that a person takes for the common cold. If you develop symptoms, call your health care provider. People with severe symptoms may need hospital care. What can I do to protect myself and my family from this virus?     You can protect yourself and your family by taking the same actions that you would take to prevent the spread of other viruses. Take the following actions:  Wash your hands often with soap and water for at least 20 seconds. If soap and water are not available, use alcohol-based hand sanitizer.  Avoid touching your face, mouth, nose, or eyes.  Cough or sneeze into a tissue, sleeve, or elbow. Do not cough or sneeze into your hand or the air. ? If you cough  or sneeze into a tissue, throw it away immediately and wash your hands.  Disinfect objects and surfaces that you frequently touch every day.  Stay away from people who are sick.  Avoid going out in public, follow guidance from your state and local health authorities.  Avoid crowded indoor spaces. Stay at least 6 ft (2 m) away from others.  If you must go out in public, wear a cloth face covering or face mask. Make sure your mask covers your nose and mouth.  Stay home if you are sick, except to get medical care. Call your health care provider before you get medical care. Your health care provider will tell you how long to stay home.  Make sure your vaccines are up to date. Ask your health care provider what vaccines you need. What should I do if I need to travel? Follow travel recommendations from your local health authority, the CDC, and WHO. Travel information and advice  Centers for Disease Control and Prevention (CDC): BodyEditor.hu  World Health Organization [facility]): ThirdIncome.ca Know the risks and take action to protect your health  You are at higher risk of getting coronavirus disease if you are traveling to areas with an outbreak or if you are exposed to travelers from areas with an outbreak.  Wash your hands often and practice good hygiene to lower the risk of catching or spreading the virus. What should I do if I am sick? General instructions to stop the spread of infection  Wash your hands often with soap and water for at least 20 seconds. If soap and water are not available, use alcohol-based hand sanitizer.  Cough or sneeze into a tissue, sleeve, or elbow. Do not cough or sneeze into your hand or the air.  If you cough or sneeze into a tissue, throw it away immediately and wash your hands.  Stay home unless you must get medical care. Call your health care provider or local  health authority before you get medical care.  Avoid public areas. Do not take public transportation, if possible.  If you can, wear a mask if you must go out of the house or if you are in close contact with someone who is not sick. Make sure your mask covers your nose and mouth. Keep your home clean  Disinfect objects and surfaces that are frequently touched every day. This may include: ? Counters and tables. ? Doorknobs and light  switches. ? Sinks and faucets. ? Electronics such as phones, remote controls, keyboards, computers, and tablets.  Wash dishes in hot, soapy water or use a dishwasher. Air-dry your dishes.  Wash laundry in hot water. Prevent infecting other household members  Let healthy household members care for children and pets, if possible. If you have to care for children or pets, wash your hands often and wear a mask.  Sleep in a different bedroom or bed, if possible.  Do not share personal items, such as razors, toothbrushes, deodorant, combs, brushes, towels, and washcloths. Where to find more information Centers for Disease Control and Prevention (CDC)  Information and news updates: https://www.butler-gonzalez.com/ World Health Organization [facility])  Information and news updates: MissExecutive.com.ee  Coronavirus health topic: https://www.castaneda.info/  Questions and answers on COVID-19: OpportunityDebt.at  Global tracker: who.sprinklr.com American Academy of Pediatrics (AAP)  Information for families: www.healthychildren.org/English/health-issues/conditions/chest-lungs/Pages/2019-Novel-Coronavirus.aspx The coronavirus situation is changing rapidly. Check your local health authority website or the CDC and [facility] websites for updates and news. When should I contact a health care provider?  Contact your health care provider if you have symptoms of an infection, such as fever or cough,  and you: ? Have been near anyone who is known to have coronavirus disease. ? Have come into contact with a person who is suspected to have coronavirus disease. ? Have traveled to an area where there is an outbreak of COVID-19. When should I get emergency medical care?  Get help right away by calling your local emergency services (911 in the [place] if you have: ? Trouble breathing. ? Pain or pressure in your chest. ? Confusion. ? Blue-tinged lips and fingernails. ? Difficulty waking from sleep. ? Symptoms that get worse. Let the emergency medical personnel know if you think you have coronavirus disease. Summary  A new respiratory virus is spreading from person to person and causing COVID-19 (coronavirus disease).  The virus that causes COVID-19 appears to spread easily. It spreads from one person to another through droplets from breathing, speaking, singing, coughing, or sneezing.  Older adults and those with chronic diseases are at higher risk of disease. If you are at higher risk for complications, take extra precautions.  There is currently no vaccine to prevent coronavirus disease. There are no medicines, such as antibiotics or antivirals, to treat the virus.  You can protect yourself and your family by washing your hands often, avoiding touching your face, and covering your coughs and sneezes. This information is not intended to replace advice given to you by your health care provider. Make sure you discuss any questions you have with your health care provider. Document Revised: 07/30/2019 Document Reviewed: 01/26/2019 Elsevier Patient Education  2020 [place]. COVID-19 COVID-19 is a respiratory infection that is caused by a virus called severe acute respiratory syndrome coronavirus 2 (SARS-CoV-2). The disease is also known as coronavirus disease or novel coronavirus. In some people, the virus may not cause any symptoms. In others, it may cause a serious infection. The infection  can get worse quickly and can lead to complications, such as:  Pneumonia, or infection of the lungs.  Acute respiratory distress syndrome or ARDS. This is a condition in which fluid build-up in the lungs prevents the lungs from filling with air and passing oxygen into the blood.  Acute respiratory failure. This is a condition in which there is not enough oxygen passing from the lungs to the body or when carbon dioxide is not passing from the lungs out of the body.  Sepsis or septic shock. This is a serious bodily reaction to an infection.  Blood clotting problems.  Secondary infections due to bacteria or fungus.  Organ failure. This is when your body's organs stop working. The virus that causes COVID-19 is contagious. This means that it can spread from person to person through droplets from coughs and sneezes (respiratory secretions). What are the causes? This illness is caused by a virus. You may catch the virus by:  Breathing in droplets from an infected person. Droplets can be spread by a person breathing, speaking, singing, coughing, or sneezing.  Touching something, like a table or a doorknob, that was exposed to the virus (contaminated) and then touching your mouth, nose, or eyes. What increases the risk? Risk for infection You are more likely to be infected with this virus if you:  Are within 6 feet (2 meters) of a person with COVID-19.  Provide care for or live with a person who is infected with COVID-19.  Spend time in crowded indoor spaces or live in shared housing. Risk for serious illness You are more likely to become seriously ill from the virus if you:  Are 10 years of age or older. The higher your age, the more you are at risk for serious illness.  Live in a nursing home or long-term care facility.  Have cancer.  Have a long-term (chronic) disease such as: ? Chronic lung disease, including chronic obstructive pulmonary disease or asthma. ? A long-term disease  that lowers your body's ability to fight infection (immunocompromised). ? Heart disease, including heart failure, a condition in which the arteries that lead to the heart become narrow or blocked (coronary artery disease), a disease which makes the heart muscle thick, weak, or stiff (cardiomyopathy). ? Diabetes. ? Chronic kidney disease. ? Sickle cell disease, a condition in which red blood cells have an abnormal "sickle" shape. ? Liver disease.  Are obese. What are the signs or symptoms? Symptoms of this condition can range from mild to severe. Symptoms may appear any time from 2 to 14 days after being exposed to the virus. They include:  A fever or chills.  A cough.  Difficulty breathing.  Headaches, body aches, or muscle aches.  Runny or stuffy (congested) nose.  A sore throat.  New loss of taste or smell. Some people may also have stomach problems, such as nausea, vomiting, or diarrhea. Other people may not have any symptoms of COVID-19. How is this diagnosed? This condition may be diagnosed based on:  Your signs and symptoms, especially if: ? You live in an area with a COVID-19 outbreak. ? You recently traveled to or from an area where the virus is common. ? You provide care for or live with a person who was diagnosed with COVID-19. ? You were exposed to a person who was diagnosed with COVID-19.  A physical exam.  Lab tests, which may include: ? Taking a sample of fluid from the back of your nose and throat (nasopharyngeal fluid), your nose, or your throat using a swab. ? A sample of mucus from your lungs (sputum). ? Blood tests.  Imaging tests, which may include, X-rays, CT scan, or ultrasound. How is this treated? At present, there is no medicine to treat COVID-19. Medicines that treat other diseases are being used on a trial basis to see if they are effective against COVID-19. Your health care provider will talk with you about ways to treat your symptoms. For most  people, the infection is mild  and can be managed at home with rest, fluids, and over-the-counter medicines. Treatment for a serious infection usually takes places in a hospital intensive care unit (ICU). It may include one or more of the following treatments. These treatments are given until your symptoms improve.  Receiving fluids and medicines through an IV.  Supplemental oxygen. Extra oxygen is given through a tube in the nose, a face mask, or a hood.  Positioning you to lie on your stomach (prone position). This makes it easier for oxygen to get into the lungs.  Continuous positive airway pressure (CPAP) or bi-level positive airway pressure (BPAP) machine. This treatment uses mild air pressure to keep the airways open. A tube that is connected to a motor delivers oxygen to the body.  Ventilator. This treatment moves air into and out of the lungs by using a tube that is placed in your windpipe.  Tracheostomy. This is a procedure to create a hole in the neck so that a breathing tube can be inserted.  Extracorporeal membrane oxygenation (ECMO). This procedure gives the lungs a chance to recover by taking over the functions of the heart and lungs. It supplies oxygen to the body and removes carbon dioxide. Follow these instructions at home: Lifestyle  If you are sick, stay home except to get medical care. Your health care provider will tell you how long to stay home. Call your health care provider before you go for medical care.  Rest at home as told by your health care provider.  Do not use any products that contain nicotine or tobacco, such as cigarettes, e-cigarettes, and chewing tobacco. If you need help quitting, ask your health care provider.  Return to your normal activities as told by your health care provider. Ask your health care provider what activities are safe for you. General instructions  Take over-the-counter and prescription medicines only as told by your health care  provider.  Drink enough fluid to keep your urine pale yellow.  Keep all follow-up visits as told by your health care provider. This is important. How is this prevented?  There is no vaccine to help prevent COVID-19 infection. However, there are steps you can take to protect yourself and others from this virus. To protect yourself:   Do not travel to areas where COVID-19 is a risk. The areas where COVID-19 is reported change often. To identify high-risk areas and travel restrictions, check the CDC travel website: FatFares.com.br  If you live in, or must travel to, an area where COVID-19 is a risk, take precautions to avoid infection. ? Stay away from people who are sick. ? Wash your hands often with soap and water for 20 seconds. If soap and water are not available, use an alcohol-based hand sanitizer. ? Avoid touching your mouth, face, eyes, or nose. ? Avoid going out in public, follow guidance from your state and local health authorities. ? If you must go out in public, wear a cloth face covering or face mask. Make sure your mask covers your nose and mouth. ? Avoid crowded indoor spaces. Stay at least 6 feet (2 meters) away from others. ? Disinfect objects and surfaces that are frequently touched every day. This may include:  Counters and tables.  Doorknobs and light switches.  Sinks and faucets.  Electronics, such as phones, remote controls, keyboards, computers, and tablets. To protect others: If you have symptoms of COVID-19, take steps to prevent the virus from spreading to others.  If you think you have a COVID-19  infection, contact your health care provider right away. Tell your health care team that you think you may have a COVID-19 infection.  Stay home. Leave your house only to seek medical care. Do not use public transport.  Do not travel while you are sick.  Wash your hands often with soap and water for 20 seconds. If soap and water are not available, use  alcohol-based hand sanitizer.  Stay away from other members of your household. Let healthy household members care for children and pets, if possible. If you have to care for children or pets, wash your hands often and wear a mask. If possible, stay in your own room, separate from others. Use a different bathroom.  Make sure that all people in your household wash their hands well and often.  Cough or sneeze into a tissue or your sleeve or elbow. Do not cough or sneeze into your hand or into the air.  Wear a cloth face covering or face mask. Make sure your mask covers your nose and mouth. Where to find more information  Centers for Disease Control and Prevention: PurpleGadgets.be  World Health Organization: https://www.castaneda.info/ Contact a health care provider if:  You live in or have traveled to an area where COVID-19 is a risk and you have symptoms of the infection.  You have had contact with someone who has COVID-19 and you have symptoms of the infection. Get help right away if:  You have trouble breathing.  You have pain or pressure in your chest.  You have confusion.  You have bluish lips and fingernails.  You have difficulty waking from sleep.  You have symptoms that get worse. These symptoms may represent a serious problem that is an emergency. Do not wait to see if the symptoms will go away. Get medical help right away. Call your local emergency services (911 in the [place] Do not drive yourself to the hospital. Let the emergency medical personnel know if you think you have COVID-19. Summary  COVID-19 is a respiratory infection that is caused by a virus. It is also known as coronavirus disease or novel coronavirus. It can cause serious infections, such as pneumonia, acute respiratory distress syndrome, acute respiratory failure, or sepsis.  The virus that causes COVID-19 is contagious. This means that it can spread from person to  person through droplets from breathing, speaking, singing, coughing, or sneezing.  You are more likely to develop a serious illness if you are 30 years of age or older, have a weak immune system, live in a nursing home, or have chronic disease.  There is no medicine to treat COVID-19. Your health care provider will talk with you about ways to treat your symptoms.  Take steps to protect yourself and others from infection. Wash your hands often and disinfect objects and surfaces that are frequently touched every day. Stay away from people who are sick and wear a mask if you are sick. This information is not intended to replace advice given to you by your health care provider. Make sure you discuss any questions you have with your health care provider. Document Revised: 07/30/2019 Document Reviewed: 11/05/2018 Elsevier Patient Education  [place].

## 2020-01-25 NOTE — Plan of Care (Signed)
Continue with current plan of care.

## 2020-01-25 NOTE — Progress Notes (Signed)
OT Cancellation Note  Patient Details Name: Duane [name] MRN: [id number] DOB: [birth year]   Cancelled Treatment:    Reason Eval/Treat Not Completed: OT screened, no needs identified, will sign off. Thank you for the OT consult. Order received and chart reviewed. Per conversation with pt primary physical therapist, pt at baseline to perform ADL tasks. No skilled OT needs identified. Will sign off at this time. Please re-consult if additional OT needs arise during this admission.   Shara Blazing, M.S., OTR/L Ascom: [phone] 01/25/20, 12:41 PM

## 2020-01-25 NOTE — Progress Notes (Signed)
PT Cancellation Note  Patient Details Name: Duane [name] MRN: [id number] DOB: 1951/03/16   Cancelled Treatment:    Reason Eval/Treat Not Completed: Patient at procedure or test/unavailable(Consult received and chart reviewed.  Patient just received breakfast tray and wishes to eat prior to evaluation.  Will re-attempt at later time as appropriate and available.)   Leoma Folds H. Owens Shark, PT, DPT, NCS 01/25/20, 9:33 AM [phone]

## 2020-01-25 NOTE — Evaluation (Signed)
Physical Therapy Evaluation Patient Details Name: Duane [name] MRN: [id number] DOB: Aug 24, 1951 Today's Date: 01/25/2020   History of Present Illness  presented to ER secondary to generalized weakness, cough, fever; admitted for management of COVID-19 PNA (treated with remdesivir), afib RVR  Clinical Impression  Upon evaluation, patient alert and oriented; follows commands and agreeable to session.  Eager for upcoming discharge home.  Bilat UE/LE strength and ROM grossly symmetrical and WFL; no focal weakness appreciated. Mild coordination deficit present L UE, but able to utilize functionally as needed.  Patient denies SOB and feels strength has improved since admission.  Able to complete bed mobility with mod indep; sit/stand, basic transfers and gait (60') without assist device, close sup/mod indep.  Demonstrates reciprocal stepping pattern; slightly inconsistent weight shift and balance performance, but able to self-correct without assist. Manages turns, obstacles without difficulty.  Sats >93% on RA with exertion. Would benefit from skilled PT to address above deficits and promote optimal return to PLOF.; Recommend transition to HHPT upon discharge from acute hospitalization.     Follow Up Recommendations Home health PT    Equipment Recommendations       Recommendations for Other Services       Precautions / Restrictions Precautions Precautions: Fall Restrictions Weight Bearing Restrictions: No      Mobility  Bed Mobility Overal bed mobility: Modified Independent                Transfers Overall transfer level: Needs assistance   Transfers: Sit to/from Stand;Stand Pivot Transfers Sit to Stand: Supervision Stand pivot transfers: Supervision       General transfer comment: slow and effortful, requires use of bilat UEs for lift off and stabilization  Ambulation/Gait Ambulation/Gait assistance: Supervision Gait Distance (Feet): 60 Feet Assistive device:  None       General Gait Details: reciprocal stepping pattern; slightly inconsistent weight and balance performance, but able to self-correct without assist. Manages turns, obstacles without difficulty.  Sats >93% on RA with exertion.  Stairs            Wheelchair Mobility    Modified Rankin (Stroke Patients Only)       Balance Overall balance assessment: Needs assistance Sitting-balance support: No upper extremity supported;Feet supported Sitting balance-Leahy Scale: Good     Standing balance support: Bilateral upper extremity supported Standing balance-Leahy Scale: Fair                               Pertinent Vitals/Pain Pain Assessment: No/denies pain    Home Living Family/patient expects to be discharged to:: Private residence Living Arrangements: Spouse/significant other Available Help at Discharge: Family Type of Home: House Home Access: Stairs to enter Entrance Stairs-Rails: Left Entrance Stairs-Number of Steps: 2 Home Layout: One level Home Equipment: None      Prior Function Level of Independence: Independent         Comments: Enjoys working on cars, working in yard     [place]: Right    Extremity/Trunk Assessment   Upper Extremity Assessment Upper Extremity Assessment: Overall WFL for tasks assessed(grossly 4+/5 throughout)    Lower Extremity Assessment Lower Extremity Assessment: Overall WFL for tasks assessed(grossly 4+/5 throughout)       Communication   Communication: No difficulties  Cognition Arousal/Alertness: Awake/alert Behavior During Therapy: WFL for tasks assessed/performed Overall Cognitive Status: Within Functional Limits for tasks assessed  General Comments      Exercises Other Exercises Other Exercises: Toilet transfer, SPT without assist device, close sup; does require UE support to stabilize Other Exercises: 5x  sit/stand with bilat UE support on armrests, 28 seconds; below age-matched norms, indicative of decreased strength/power bilat LEs   Assessment/Plan    PT Assessment Patient needs continued PT services  PT Problem List Decreased balance;Decreased activity tolerance;Decreased mobility       PT Treatment Interventions DME instruction;Gait training;Functional mobility training;Therapeutic activities;Therapeutic exercise;Balance training;Patient/family education    PT Goals (Current goals can be found in the Care Plan section)  Acute Rehab PT Goals Patient Stated Goal: to go home! PT Goal Formulation: With patient Time For Goal Achievement: 02/08/20 Potential to Achieve Goals: Good    Frequency Min 2X/week   Barriers to discharge        Co-evaluation               AM-PAC PT "6 Clicks" Mobility  Outcome Measure Help needed turning from your back to your side while in a flat bed without using bedrails?: None Help needed moving from lying on your back to sitting on the side of a flat bed without using bedrails?: None Help needed moving to and from a bed to a chair (including a wheelchair)?: None Help needed standing up from a chair using your arms (e.g., wheelchair or bedside chair)?: None Help needed to walk in hospital room?: None Help needed climbing 3-5 steps with a railing? : A Little 6 Click Score: 23    End of Session Equipment Utilized During Treatment: Gait belt Activity Tolerance: Patient tolerated treatment well Patient left: in chair;with call bell/phone within reach Nurse Communication: Mobility status PT Visit Diagnosis: Difficulty in walking, not elsewhere classified (R26.2);Muscle weakness (generalized) (M62.81)    Time: 1751-0258 PT Time Calculation (min) (ACUTE ONLY): 24 min   Charges:   PT Evaluation $PT Eval Moderate Complexity: 1 Mod PT Treatments $Therapeutic Activity: 8-22 mins        Brittnei Jagiello H. Owens Shark, PT, DPT, NCS 01/25/20, 3:07  PM [phone]

## 2020-01-26 ENCOUNTER — Telehealth: Payer: Self-pay

## 2020-01-26 NOTE — Discharge Summary (Signed)
Duane [name] at [place] NAME: Duane [name]    MR#:  [id number]  DATE OF BIRTH:  [birth year]  DATE OF ADMISSION:  01/21/2020   ADMITTING PHYSICIAN: Duane Mormon, MD  DATE OF DISCHARGE: 01/25/2020  1:00 PM  PRIMARY CARE PHYSICIAN: Duane Grana, PA-C   ADMISSION DIAGNOSIS:  Hypomagnesemia [E83.42] Atrial fibrillation with rapid ventricular response (HCC) [I48.91] Atrial fibrillation with RVR ([place]) [I48.91] DISCHARGE DIAGNOSIS:  Active Problems:   Atrial fibrillation with rapid ventricular response (HCC)   Atrial fibrillation with RVR (HCC)   Hypomagnesemia   COVID-19 virus infection  SECONDARY DIAGNOSIS:   Past Medical History:  Diagnosis Date  . A-fib [facility])    requiring life-long anticouagulation  . Aortic ectasia, abdominal ([place]) 05/22/2018   [place] Sept 2018  . Cerebellar infarct ([place])   . CKD (chronic kidney disease)    creatinine baseline 1.3-1.5 in 2015  . GERD (gastroesophageal reflux disease)   . Gout   . Graves disease   . H/O hyperkalemia    with Bactrim  . Hyperlipidemia    requiring life long statin therapy  . Hyperparathyroidism, secondary renal ([place]) 10/02/2017  . Hypertension   . Hypothyroidism   . Left hemiparesis [facility]) May 2015   s/p strke  . Poor short term memory   . Renal insufficiency   . Restrictive lung disease    mild  . Stroke ([place]) 02/20/14   left side hemiparesis, dysphagia  . UTI (urinary tract infection) due to Enterococcus May 2015  . Vitamin D deficiency disease    HOSPITAL COURSE:  [name] a69 y.o.Caucasianmalewith a known history of atrial fibrillation, hypertension, hyperlipidemia, hypothyroidism and previous history of CVA, admitted for acute onset of generalized weakness and lack of energy. He presented with cough, chills with low-grade fever of 99 without dyspnea or wheezing. He had postnasal drip with mild nasal congestion and feeling of choking.  Upon presentation to the emergency  room, heart rate was 119 with otherwise normal vital signs. Labs revealed a BUN of 30 and creatinine of 2.48 slightly worse than his previous levels and a CO2 of 21 with magnesium level of 1.4. BNP was 60 and high-sensitivity troponin I was 26. Lactic acid was 1.1. CBC showed mild leukopenia of 3.7 down from 6.4 in October of last year. It showed macrocytosis and thrombocytopenia 103 compared to 144 then. His COVID-19 PCR came back positive and influenza antigens were negative. Urinalysis was unremarkable except for 100 protein. Alcohol levels less than 10. Chest x-ray showed cardiomegaly with no acute cardiopulmonary disease. EKG initially showed atrial fibrillation with rapid ventricular sponsor 127.  The patient was given 10 mg of IV Cardizem bolus followed by drip. Systolic blood pressure dropped to 88 at which time IV Cardizem drip was stopped. He maintained heart rate in the 80s still in atrial fibrillation. He was given IV remdesivir, 2 g of IV magnesium sulfate, 6 mg of IV Decadron and 1 L bolus of IV normal saline.   1.Paroxysmal atrial fibrillation with rapid ventricular response. -Continue p.o. Cardizem for rate control -2D echo EF 45 to 50%.  Seen by Duane [name] during hospitalization - TSH within normal limits - continue his p.o. Eliquis for anticoagulation  2. COVID-19. -Completed course of IV remdesivir -chest CT without contrast consistent with covid pna.  3. Mild acute kidney injury superimposed on stage IIIb chronic kidney disease. -Hydrated with IV fluids and creatinine close to baseline  4. Gout. -  continue allopurinol.  5. Depression. - continue Prozac.   DISCHARGE CONDITIONS:  Stable CONSULTS OBTAINED:   DRUG ALLERGIES:   Allergies  Allergen Reactions  . Bactrim [Sulfamethoxazole-Trimethoprim] Other (See Comments)    Hyperkalemia  . Chlorpromazine Other (See Comments)    Hiccups, hallucinations  . Cyclobenzaprine Other (See Comments)     Sleeps for 8 hrs after taking it  . Oxycodone Other (See Comments)    Excessive sleepiness   DISCHARGE MEDICATIONS:   Allergies as of 01/25/2020      Reactions   Bactrim [sulfamethoxazole-trimethoprim] Other (See Comments)   Hyperkalemia   Chlorpromazine Other (See Comments)   Hiccups, hallucinations   Cyclobenzaprine Other (See Comments)   Sleeps for 8 hrs after taking it   Oxycodone Other (See Comments)   Excessive sleepiness      Medication List    TAKE these medications   allopurinol 300 MG tablet Commonly known as: ZYLOPRIM Take 300 mg by mouth daily.   apixaban 5 MG Tabs tablet Commonly known as: ELIQUIS Take 1 tablet (5 mg total) by mouth 2 (two) times daily.   ascorbic acid 500 MG tablet Commonly known as: VITAMIN C Take 1 tablet (500 mg total) by mouth daily.   calcitRIOL 0.25 MCG capsule Commonly known as: ROCALTROL Take 0.25 mcg daily by mouth.   diltiazem 120 MG 24 hr capsule Commonly known as: CARDIZEM CD Take 1 capsule (120 mg total) by mouth daily.   doxazosin 1 MG tablet Commonly known as: CARDURA Take 1 tablet (1 mg total) by mouth daily.   famotidine 20 MG tablet Commonly known as: PEPCID Take 1 tablet by mouth twice daily   ferrous sulfate 325 (65 FE) MG tablet Take 325 mg by mouth daily with breakfast.   FLUoxetine 20 MG tablet Commonly known as: PROZAC Take 1 tablet (20 mg total) by mouth daily.   levothyroxine 88 MCG tablet Commonly known as: SYNTHROID Take 1 tablet (88 mcg total) by mouth daily.   losartan 25 MG tablet Commonly known as: COZAAR Take 1 tablet (25 mg total) by mouth daily.   methylphenidate 5 MG tablet Commonly known as: Ritalin Take 1 tablet (5 mg total) by mouth daily. What changed: Another medication with the same name was removed. Continue taking this medication, and follow the directions you see here.   mirtazapine 15 MG tablet Commonly known as: REMERON Take 1 tablet (15 mg total) by mouth at  bedtime.   pravastatin 40 MG tablet Commonly known as: PRAVACHOL Take 1 tablet (40 mg total) by mouth daily.   vitamin B-12 500 MCG tablet Commonly known as: CYANOCOBALAMIN Take 500 mcg by mouth daily.   Vitamin D3 50 MCG (2000 UT) capsule Take 2,000 Units by mouth daily.   zinc sulfate 220 (50 Zn) MG capsule Take 1 capsule (220 mg total) by mouth daily.      DISCHARGE INSTRUCTIONS:   DIET:  Cardiac diet DISCHARGE CONDITION:  Stable ACTIVITY:  Activity as tolerated OXYGEN:  Home Oxygen: No.  Oxygen Delivery: room air DISCHARGE LOCATION:  home with home health PT  If you experience worsening of your admission symptoms, develop shortness of breath, life threatening emergency, suicidal or homicidal thoughts you must seek medical attention immediately by calling 911 or calling your MD immediately  if symptoms less severe.  You Must read complete instructions/literature along with all the possible adverse reactions/side effects for all the Medicines you take and that have been prescribed to you. Take any new Medicines after you  have completely understood and accpet all the possible adverse reactions/side effects.   Please note  You were cared for by a hospitalist during your hospital stay. If you have any questions about your discharge medications or the care you received while you were in the hospital after you are discharged, you can call the unit and asked to speak with the hospitalist on call if the hospitalist that took care of you is not available. Once you are discharged, your primary care physician will handle any further medical issues. Please note that NO REFILLS for any discharge medications will be authorized once you are discharged, as it is imperative that you return to your primary care physician (or establish a relationship with a primary care physician if you do not have one) for your aftercare needs so that they can reassess your need for medications and monitor  your lab values.    On the day of Discharge:  VITAL SIGNS:  Blood pressure (!) 141/76, pulse 79, temperature 97.8 F (36.6 C), temperature source Oral, resp. rate 18, height 6\' 4"  (1.93 m), weight 96.2 kg, SpO2 98 %. PHYSICAL EXAMINATION:  GENERAL:  69 y.o.-year-old patient lying in the bed with no acute distress.  EYES: Pupils equal, round, reactive to light and accommodation. No scleral icterus. Extraocular muscles intact.  HEENT: Head atraumatic, normocephalic. Oropharynx and nasopharynx clear.  NECK:  Supple, no jugular venous distention. No thyroid enlargement, no tenderness.  LUNGS: Normal breath sounds bilaterally, no wheezing, rales,rhonchi or crepitation. No use of accessory muscles of respiration.  CARDIOVASCULAR: S1, S2 normal. No murmurs, rubs, or gallops.  ABDOMEN: Soft, non-tender, non-distended. Bowel sounds present. No organomegaly or mass.  EXTREMITIES: No pedal edema, cyanosis, or clubbing.  NEUROLOGIC: Cranial nerves II through XII are intact. Muscle strength 5/5 in all extremities. Sensation intact. Gait not checked.  PSYCHIATRIC: The patient is alert and oriented x 3.  SKIN: No obvious rash, lesion, or ulcer.  DATA REVIEW:   CBC Recent Labs  Lab 01/25/20 0657  WBC 14.2*  HGB 13.7  HCT 41.7  PLT 142*    Chemistries  Recent Labs  Lab 01/25/20 0657  NA 139  K 4.6  CL 115*  CO2 18*  GLUCOSE 98  BUN 51*  CREATININE 2.04*  CALCIUM 8.3*  MG 1.7  AST 30  ALT 15  ALKPHOS 71  BILITOT 0.7     Outpatient follow-up Follow-up Information    Duane Grana, PA-C. Go on 02/08/2020.   Specialty: Family Medicine Why: @2 :20 PM  Contact information: [address] [place] [address] [phone]        Teodoro Spray, MD. Go on 01/28/2020.   Specialty: Cardiology Why: @9 :00 AM Contact information: [place] [address] [phone]            Management plans discussed with the patient, family and they are in  agreement.  CODE STATUS: Prior   TOTAL TIME TAKING CARE OF THIS PATIENT: 45 minutes.    Max Sane M.D on 01/26/2020 at 3:44 PM  [facility]   CC: Primary care physician; Duane Grana, PA-C   Note: This dictation was prepared with Dragon dictation along with smaller phrase technology. Any transcriptional errors that result from this process are unintentional.

## 2020-01-26 NOTE — Telephone Encounter (Signed)
Attempted to reach patient for TCM call and confirm hospital follow up appt. Left message to call back. Pt may reach me directly at [phone] or contact the office.

## 2020-01-27 NOTE — Telephone Encounter (Signed)
Transition Care Management Follow-up Telephone Call  Date of discharge and from where: 01/25/20 [facility]  How have you been since you were released from the hospital? Pt very fatigued  Any questions or concerns? No   Items Reviewed:  Did the pt receive and understand the discharge instructions provided? Yes   Medications obtained and verified? Yes   Any new allergies since your discharge? No   Dietary orders reviewed? Yes  Do you have support at home? Yes   Functional Questionnaire: (I = Independent and D = Dependent) ADLs: I  Bathing/Dressing- I  Meal Prep- I  Eating- I  Maintaining continence- I  Transferring/Ambulation- I  Managing Meds- I  Follow up appointments reviewed:   PCP Hospital f/u appt confirmed? Yes  Scheduled to see Delsa Grana PAC on 02/08/20 @ 2:20.  [facility] f/u appt confirmed? Yes  Scheduled to see Dr. Ubaldo Glassing on 01/28/20 @ 9:00.  Are transportation arrangements needed? No   If their condition worsens, is the pt aware to call PCP or go to the Emergency Dept.? Yes  Was the patient provided with contact information for the PCP's office or ED? Yes  Was to pt encouraged to call back with questions or concerns? Yes

## 2020-02-04 ENCOUNTER — Other Ambulatory Visit: Payer: Self-pay | Admitting: Internal Medicine

## 2020-02-04 DIAGNOSIS — U071 COVID-19: Secondary | ICD-10-CM

## 2020-02-08 ENCOUNTER — Other Ambulatory Visit: Payer: Self-pay

## 2020-02-08 ENCOUNTER — Inpatient Hospital Stay: Payer: Medicare HMO | Admitting: Family Medicine

## 2020-02-17 ENCOUNTER — Other Ambulatory Visit: Payer: Self-pay

## 2020-02-17 ENCOUNTER — Encounter: Payer: Self-pay | Admitting: Family Medicine

## 2020-02-17 ENCOUNTER — Ambulatory Visit (INDEPENDENT_AMBULATORY_CARE_PROVIDER_SITE_OTHER): Payer: Medicare HMO | Admitting: Family Medicine

## 2020-02-17 VITALS — BP 120/60 | HR 92 | Temp 97.9°F | Resp 16 | Ht 76.0 in | Wt 214.0 lb

## 2020-02-17 DIAGNOSIS — N179 Acute kidney failure, unspecified: Secondary | ICD-10-CM

## 2020-02-17 DIAGNOSIS — I4891 Unspecified atrial fibrillation: Secondary | ICD-10-CM | POA: Diagnosis not present

## 2020-02-17 DIAGNOSIS — Z09 Encounter for follow-up examination after completed treatment for conditions other than malignant neoplasm: Secondary | ICD-10-CM

## 2020-02-17 DIAGNOSIS — G8194 Hemiplegia, unspecified affecting left nondominant side: Secondary | ICD-10-CM

## 2020-02-17 DIAGNOSIS — D696 Thrombocytopenia, unspecified: Secondary | ICD-10-CM

## 2020-02-17 DIAGNOSIS — D72829 Elevated white blood cell count, unspecified: Secondary | ICD-10-CM

## 2020-02-17 DIAGNOSIS — F339 Major depressive disorder, recurrent, unspecified: Secondary | ICD-10-CM

## 2020-02-17 DIAGNOSIS — I77811 Abdominal aortic ectasia: Secondary | ICD-10-CM

## 2020-02-17 DIAGNOSIS — N2581 Secondary hyperparathyroidism of renal origin: Secondary | ICD-10-CM

## 2020-02-17 DIAGNOSIS — Z5181 Encounter for therapeutic drug level monitoring: Secondary | ICD-10-CM

## 2020-02-17 DIAGNOSIS — N184 Chronic kidney disease, stage 4 (severe): Secondary | ICD-10-CM

## 2020-02-17 DIAGNOSIS — F988 Other specified behavioral and emotional disorders with onset usually occurring in childhood and adolescence: Secondary | ICD-10-CM

## 2020-02-17 DIAGNOSIS — U071 COVID-19: Secondary | ICD-10-CM

## 2020-02-17 NOTE — Progress Notes (Signed)
Name: Duane [name]   MRN: [id number]    DOB: [birth year]   Date:02/17/2020       Progress Note  Chief Complaint  Patient presents with  . Hospitalization Follow-up    for low mag, covid and afib     Subjective:   Duane [name] is a 69 y.o. male, presents to clinic for routine follow up on the conditions listed above.  Patient presents for follow-up after hospitalization on April 9-13th patient was admitted to the hospital with A. fib with RVR, also had Covid at that time, though his sx started the week before.  He also had COVID associated pneumonia  A prior transition of care phone call was completed on 01/26/2020 by Clemetine Marker, and patient was scheduled to be seen within 14 days, but unfortunately that appointment was rescheduled for today due to COVID restrictions and [place], pt came for visit but was told to reschedule - unfortunately out of the window for transition of care visit.  Patient followed up with cardiology, Dr. Ubaldo Glassing on 4/16 over the phone, medications were reviewed, pt was told to continue all meds, but they did not discuss cardizem which was prescribed by the hospital provider with 30 pills and no refills not refilled by cardiology and not mentioned in their note I reviewed today and did additionally reach out to cardiology to see if they would advise on continuing Cardizem.  Patient still has few more days left, did take today and he is rate controlled with a heart rate of 72 when manually during the exam today with pulse ox with initial vital signs with a more brief time monitoring the heart rate is noted to be 92   Here for hospital follow up/transition of care.  Admit date: 01/21/2020 Discharge date: 01/25/2020 Transition of care was initiated previously by Clemetine Marker on 01/26/2020 and 01/27/2020 and med changes, diagnosis, specialist follow ups and pts symptoms and condition were all reviewed.  Pt was admitted for weakness with A. fib with  RVR, severe exertional dyspnea, onset of symptoms was 01/14/2020 Admission diagnosis was proximal A. fib with RVR, hypomagnesemia, COVID-19 Pneumonia.  AKI superimposed on CKD stage 4  Patient was diagnosed admitted to the hospital with the following diagnoses 1.Paroxysmal atrial fibrillation with rapid ventricular response. -Continue p.o. Cardizem for rate control -2D echoEF 45 to 50%.  Seen by Dr. Nehemiah Massed during hospitalization -TSH within normal limits -continue his p.o. Eliquis for anticoagulation  2. COVID-19. -Completed course of IV remdesivir -chest CT without contrast consistent with covid pna.  3. Mild acute kidney injury superimposed on stage IIIb chronic kidney disease. -Hydrated with IV fluids and creatinine close to baseline  4. Gout. -continue allopurinol.  5. Depression. -continue Prozac.    New medications started per hospitalization include Cardizem for rate control, continue p.o. Eliquis for anticoagulation with proximal A. Fib, he was given and completed IV remdesivir, AKI rehydrated with IV fluids creatinine was near baseline at time of discharge Labs due today -none indicated according to the discharge summary but he was discharged with leukocytosis and thrombocytopenia, him creatinine was 2.04, near baseline with history of CKD stage IV sees nephrology, magnesium was low replace with IV magnesium-  low magnesium was resolved at the time that he left the hospital but was low end of normal  Pt feels much better he is gradually improving especially his respiratory symptoms he does not feel any chest pain pressure and exertional shortness of breath has significantly improved  and back to baseline.  He has not had any rapid heart rate palpitations near syncope, lower extremity edema  Patient's admission hospitalization and discharge summary have been reviewed  Patient and his wife are unsure if he should be continuing Cardizem or not  There were also  some changes noted on the discharge summary to his methylphenidate which he takes for attention status post stroke     Patient Active Problem List   Diagnosis Date Noted  . Hypomagnesemia   . COVID-19 virus infection   . Atrial fibrillation with RVR ([place]) 01/22/2020  . Atrial fibrillation with rapid ventricular response ([place]) 01/21/2020  . Episode of recurrent major depressive disorder ([place]) 06/11/2019  . Aortic ectasia, abdominal ([place]) 05/22/2018  . Hyperparathyroidism, secondary renal ([place]) 10/02/2017  . Bursitis of shoulder 06/23/2017  . Controlled substance agreement signed 05/23/2017  . On stimulant medication 02/22/2017  . Chronic low back pain without sciatica 08/21/2016  . Low libido 05/23/2016  . Colon polyp 11/27/2015  . Vitamin D deficiency 09/04/2015  . 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 Anemia, chronic renal failure 06/09/2015  . Chronic kidney disease, stage IV (severe) ([place])   . Hypertension   . Chronic gout due to renal impairment   . Hyperlipidemia   . Hypothyroidism following radioiodine therapy   . Restrictive lung disease   . A-fib ([place])   . History of gout 02/28/2014  . Dysphagia following cerebral infarction 02/21/2014  . History of arterial ischemic stroke 02/21/2014  . Left hemiparesis ([place]) 02/11/2014    Past Surgical History:  Procedure Laterality Date  . CATARACT EXTRACTION W/PHACO Right 08/27/2016   Procedure: CATARACT EXTRACTION PHACO AND INTRAOCULAR LENS PLACEMENT (IOC);  Surgeon: Eulogio Bear, MD;  Location: [facility];  Service: Ophthalmology;  Laterality: Right;  RIGHT  . ENTEROSTOMY CLOSURE  11/27/15   ileostomy takedown  . EXPLORATORY LAPAROTOMY  11/27/15  . illeostomy  07/2014  . POLYPECTOMY  10/15   GI surgery to remove polyp    Family History  Problem Relation Age of Onset  . Arthritis Mother   . Heart disease Mother   . Heart attack Father   . Heart disease Father   . Cirrhosis Sister   .  Hyperparathyroidism Neg Hx     Social History   Tobacco Use  . Smoking status: Never Smoker  . Smokeless tobacco: Never Used  . Tobacco comment: smoking cessation materials not required  Substance Use Topics  . Alcohol use: No  . Drug use: No      Current Outpatient Medications:  .  allopurinol (ZYLOPRIM) 300 MG tablet, Take 300 mg by mouth daily., Disp: , Rfl:  .  apixaban (ELIQUIS) 5 MG TABS tablet, Take 1 tablet (5 mg total) by mouth 2 (two) times daily., Disp: 180 tablet, Rfl: 3 .  ascorbic acid (VITAMIN C) 500 MG tablet, Take 1 tablet (500 mg total) by mouth daily., Disp: 30 tablet, Rfl: 0 .  calcitRIOL (ROCALTROL) 0.25 MCG capsule, Take 0.25 mcg daily by mouth. , Disp: , Rfl:  .  Cholecalciferol (VITAMIN D3) 2000 units capsule, Take 2,000 Units by mouth daily., Disp: , Rfl:  .  cyanocobalamin 500 MCG tablet, Take 500 mcg by mouth daily., Disp: , Rfl:  .  diltiazem (CARDIZEM CD) 120 MG 24 hr capsule, Take 1 capsule (120 mg total) by mouth daily., Disp: 30 capsule, Rfl: 0 .  doxazosin (CARDURA) 1 MG tablet, Take 1 tablet (1 mg total) by mouth daily., Disp:  90 tablet, Rfl: 1 .  famotidine (PEPCID) 20 MG tablet, Take 1 tablet by mouth twice daily, Disp: 180 tablet, Rfl: 0 .  ferrous sulfate 325 (65 FE) MG tablet, Take 325 mg by mouth daily with breakfast. , Disp: , Rfl:  .  FLUoxetine (PROZAC) 20 MG tablet, Take 1 tablet (20 mg total) by mouth daily., Disp: 90 tablet, Rfl: 1 .  levothyroxine (SYNTHROID) 88 MCG tablet, Take 1 tablet (88 mcg total) by mouth daily., Disp: 90 tablet, Rfl: 1 .  losartan (COZAAR) 25 MG tablet, Take 1 tablet (25 mg total) by mouth daily., Disp: 90 tablet, Rfl: 1 .  methylphenidate (RITALIN) 5 MG tablet, Take 1 tablet (5 mg total) by mouth daily., Disp: 30 tablet, Rfl: 0 .  mirtazapine (REMERON) 15 MG tablet, Take 1 tablet (15 mg total) by mouth at bedtime., Disp: 90 tablet, Rfl: 1 .  pravastatin (PRAVACHOL) 40 MG tablet, Take 1 tablet (40 mg total) by mouth  daily., Disp: 90 tablet, Rfl: 1 .  zinc sulfate 220 (50 Zn) MG capsule, Take 1 capsule (220 mg total) by mouth daily., Disp: 30 capsule, Rfl: 0  Allergies  Allergen Reactions  . Bactrim [Sulfamethoxazole-Trimethoprim] Other (See Comments)    Hyperkalemia  . Chlorpromazine Other (See Comments)    Hiccups, hallucinations  . Cyclobenzaprine Other (See Comments)    Sleeps for 8 hrs after taking it  . Oxycodone Other (See Comments)    Excessive sleepiness    Chart Review Today: I personally reviewed active problem list, medication list, allergies, family history, social history, health maintenance, notes from last encounter, lab results, imaging with the patient/caregiver today.  Review of Systems  10 Systems reviewed and are negative for acute change except as noted in the HPI.  Objective:    Vitals:   02/17/20 1524  BP: 120/60  Pulse: 92  Resp: 16  Temp: 97.9 F (36.6 C)  SpO2: 98%  Weight: 214 lb (97.1 kg)  Height: 6\' 4"  (1.93 m)    Body mass index is 26.05 kg/m.  Physical Exam Vitals and nursing note reviewed.  Constitutional:      General: He is not in acute distress.    Appearance: He is not ill-appearing, toxic-appearing or diaphoretic.  HENT:     Head: Normocephalic and atraumatic.  Eyes:     General: No scleral icterus.       Right eye: No discharge.        Left eye: No discharge.     Conjunctiva/sclera: Conjunctivae normal.  Cardiovascular:     Rate and Rhythm: Normal rate. Rhythm irregularly irregular.     Pulses: Normal pulses.          Radial pulses are 2+ on the right side and 2+ on the left side.     Heart sounds: Normal heart sounds. No murmur. No friction rub. No gallop.      Comments: HR 72 when counted for 30 s manually during exam Pulmonary:     Effort: Pulmonary effort is normal. No respiratory distress.     Breath sounds: Normal breath sounds. No stridor. No wheezing, rhonchi or rales.  Abdominal:     General: Bowel sounds are normal.  There is no distension.     Palpations: Abdomen is soft.  Musculoskeletal:     Right lower leg: No edema.     Left lower leg: No edema.  Skin:    General: Skin is warm and dry.     Capillary Refill: Capillary refill  takes less than 2 seconds.  Neurological:     Mental Status: He is alert. Mental status is at baseline.  Psychiatric:        Mood and Affect: Mood normal.       PHQ2/9: Depression screen [facility] 2/9 02/17/2020 09/17/2019 06/17/2019 06/11/2019 03/12/2019  Decreased Interest 0 0 0 0 0  Down, Depressed, Hopeless 0 0 0 0 0  PHQ - 2 Score 0 0 0 0 0  Altered sleeping 0 0 - 0 0  Tired, decreased energy 0 0 - 0 0  Change in appetite 0 0 - 0 0  Feeling bad or failure about yourself  0 0 - 0 0  Trouble concentrating 0 0 - 0 0  Moving slowly or fidgety/restless 0 0 - 0 0  Suicidal thoughts 0 0 - 0 0  PHQ-9 Score 0 0 - 0 0  Difficult doing work/chores Not difficult at all Not difficult at all - Not difficult at all Not difficult at all  Some recent data might be hidden    phq 9 is negative, reviewed  Fall Risk: Fall Risk  02/17/2020 09/17/2019 06/17/2019 06/11/2019 03/12/2019  Falls in the past year? 0 0 0 0 0  Number falls in past yr: 0 0 0 0 -  Injury with Fall? 0 0 0 0 -  Risk for fall due to : - - - - -  Follow up - - Falls prevention discussed - -    Functional Status Survey: Is the patient deaf or have difficulty hearing?: No Does the patient have difficulty seeing, even when wearing glasses/contacts?: No Does the patient have difficulty concentrating, remembering, or making decisions?: No Does the patient have difficulty walking or climbing stairs?: No Does the patient have difficulty dressing or bathing?: No Does the patient have difficulty doing errands alone such as visiting a doctor's office or shopping?: No   Assessment & Plan:     ICD-10-CM   1. Encounter for examination following treatment at hospital  Z09 CBC with Differential/Platelet    COMPLETE METABOLIC PANEL  WITH GFR    Magnesium   Delayed transition of care visit and hospital follow-up due to Covid restrictions of patients in office for 30 days  2. Atrial fibrillation with RVR (HCC)  M76.72 COMPLETE METABOLIC PANEL WITH GFR   Per cardiology, Dr. Ubaldo Glassing - Q regarding Cardizem was given hold parameters in hospital and discharged with once daily dosing 120 mg extended release Currently on medications but near running out Patient has a regular rate and rhythm currently but rate controlled at 72, discussed watching for signs and symptoms of medication side effects and hold parameters including hypotension or bradycardia I did send a message to cardiology and patient will follow up with cardiology as well tomorrow  3. Hypomagnesemia  C94.70 COMPLETE METABOLIC PANEL WITH GFR    Magnesium   Very low magnesium replaced in hospital with last labs showing low end of normal will recheck today  4. AKI (acute kidney injury) ([place])  J62.8 COMPLETE METABOLIC PANEL WITH GFR   AKI superimposed on CKD stage 4 per nephrology -recheck and monitor renal function encourage follow-up with nephrology  5. Chronic kidney disease, stage IV (severe) (HCC)  Z66.2 COMPLETE METABOLIC PANEL WITH GFR  6. COVID-19 virus infection  U07.1    Covid with pneumonia treated with a remdesivir, Decadron, patient feels that he is gradually improving  7. Leukocytosis, unspecified type  D72.829 CBC with Differential/Platelet   White count elevated recheck  labs today  8. Thrombocytopenia (HCC)  D69.6 CBC with Differential/Platelet   Low platelets at time of discharge recheck platelet counts today  9. Attention deficit disorder (ADD) without hyperactivity  F98.8 methylphenidate (RITALIN) 5 MG tablet   Refill on methylphenidate  10. Left hemiparesis ([place]) Chronic G81.94    Status post stroke in 2016, currently in A. fib, rate controlled on Eliquis  11. Episode of recurrent major depressive disorder, unspecified depression episode severity ([place])  Chronic F33.9    Secondary to stroke managed with Prozac and methylphenidate, mood is good  12. Hyperparathyroidism, secondary renal ([place]) Chronic N25.81    Managed by nephrology  13. Aortic ectasia, abdominal (HCC) Chronic I77.811    Need to keep LDL under 70; GI suggested pravastatin, follow-up on LFTs and lipid panel   14. Encounter for medication monitoring  Z51.81 CBC with Differential/Platelet    COMPLETE METABOLIC PANEL WITH GFR    Magnesium   Greater than 50% of this visit was spent in direct face-to-face counseling, obtaining history and physical, discussing and educating pt on treatment plan.  Total time of this visit was 50+ min.  Remainder of time involved but was not limited to reviewing chart (recent and pertinent OV notes and labs), documentation in EMR, and coordinating care and treatment plan. Spent 3:20-3:50 with pt and wife in exam room, 10 min prior to visit chart review and chart prep, additional time spent coordinating care with cardiology, reviewing hospital records, chart documentation etc.  Return for 3 month f/up routine.   Delsa Grana, PA-C 02/17/20 3:20 PM

## 2020-02-18 ENCOUNTER — Encounter: Payer: Self-pay | Admitting: Family Medicine

## 2020-02-18 DIAGNOSIS — N179 Acute kidney failure, unspecified: Secondary | ICD-10-CM | POA: Insufficient documentation

## 2020-02-18 LAB — COMPLETE METABOLIC PANEL WITH GFR
AG Ratio: 1 (calc) (ref 1.0–2.5)
ALT: 13 U/L (ref 9–46)
AST: 19 U/L (ref 10–35)
Albumin: 3.2 g/dL — ABNORMAL LOW (ref 3.6–5.1)
Alkaline phosphatase (APISO): 98 U/L (ref 35–144)
BUN/Creatinine Ratio: 13 (calc) (ref 6–22)
BUN: 29 mg/dL — ABNORMAL HIGH (ref 7–25)
CO2: 28 mmol/L (ref 20–32)
Calcium: 9.5 mg/dL (ref 8.6–10.3)
Chloride: 105 mmol/L (ref 98–110)
Creat: 2.27 mg/dL — ABNORMAL HIGH (ref 0.70–1.25)
GFR, Est African American: 33 mL/min/{1.73_m2} — ABNORMAL LOW (ref >=60)
GFR, Est Non African American: 29 mL/min/{1.73_m2} — ABNORMAL LOW (ref >=60)
Globulin: 3.2 g/dL (calc) (ref 1.9–3.7)
Glucose, Bld: 107 mg/dL — ABNORMAL HIGH (ref 65–99)
Potassium: 5.2 mmol/L (ref 3.5–5.3)
Sodium: 139 mmol/L (ref 135–146)
Total Bilirubin: 0.4 mg/dL (ref 0.2–1.2)
Total Protein: 6.4 g/dL (ref 6.1–8.1)

## 2020-02-18 LAB — CBC WITH DIFFERENTIAL/PLATELET
Absolute Monocytes: 656 cells/uL (ref 200–950)
Basophils Absolute: 66 cells/uL (ref 0–200)
Basophils Relative: 0.8 %
Eosinophils Absolute: 282 cells/uL (ref 15–500)
Eosinophils Relative: 3.4 %
HCT: 38.7 % (ref 38.5–50.0)
Hemoglobin: 12.7 g/dL — ABNORMAL LOW (ref 13.2–17.1)
Lymphs Abs: 1710 cells/uL (ref 850–3900)
MCH: 32 pg (ref 27.0–33.0)
MCHC: 32.8 g/dL (ref 32.0–36.0)
MCV: 97.5 fL (ref 80.0–100.0)
MPV: 12 fL (ref 7.5–12.5)
Monocytes Relative: 7.9 %
Neutro Abs: 5586 cells/uL (ref 1500–7800)
Neutrophils Relative %: 67.3 %
Platelets: 221 10*3/uL (ref 140–400)
RBC: 3.97 10*6/uL — ABNORMAL LOW (ref 4.20–5.80)
RDW: 13.1 % (ref 11.0–15.0)
Total Lymphocyte: 20.6 %
WBC: 8.3 10*3/uL (ref 3.8–10.8)

## 2020-02-18 LAB — MAGNESIUM: Magnesium: 1.4 mg/dL — ABNORMAL LOW (ref 1.5–2.5)

## 2020-02-18 MED ORDER — MAGNESIUM OXIDE 400 MG PO TABS: 200.0000 mg | ORAL_TABLET | Freq: Every day | ORAL | 0 refills | Status: AC

## 2020-02-18 MED ORDER — METHYLPHENIDATE HCL 5 MG PO TABS: ORAL_TABLET | ORAL | 0 refills | Status: DC

## 2020-02-18 NOTE — Addendum Note (Signed)
Addended by: Delsa Grana on: 02/18/2020 08:02 PM   Modules accepted: Orders

## 2020-02-21 ENCOUNTER — Telehealth: Payer: Self-pay

## 2020-02-22 ENCOUNTER — Other Ambulatory Visit: Payer: Self-pay | Admitting: Internal Medicine

## 2020-02-23 NOTE — Telephone Encounter (Signed)
error 

## 2020-03-17 ENCOUNTER — Ambulatory Visit: Payer: Medicare HMO | Admitting: Family Medicine

## 2020-03-21 ENCOUNTER — Other Ambulatory Visit: Payer: Self-pay | Admitting: Family Medicine

## 2020-03-21 DIAGNOSIS — F0631 Mood disorder due to known physiological condition with depressive features: Secondary | ICD-10-CM

## 2020-04-03 ENCOUNTER — Other Ambulatory Visit: Payer: Self-pay | Admitting: Family Medicine

## 2020-04-12 ENCOUNTER — Other Ambulatory Visit: Payer: Self-pay | Admitting: Family Medicine

## 2020-04-12 DIAGNOSIS — E782 Mixed hyperlipidemia: Secondary | ICD-10-CM

## 2020-04-28 ENCOUNTER — Other Ambulatory Visit: Payer: Self-pay | Admitting: Family Medicine

## 2020-04-28 DIAGNOSIS — G4700 Insomnia, unspecified: Secondary | ICD-10-CM

## 2020-05-19 ENCOUNTER — Other Ambulatory Visit: Payer: Self-pay | Admitting: Family Medicine

## 2020-05-19 ENCOUNTER — Encounter: Payer: Self-pay | Admitting: Family Medicine

## 2020-05-19 ENCOUNTER — Other Ambulatory Visit: Payer: Self-pay

## 2020-05-19 ENCOUNTER — Ambulatory Visit (INDEPENDENT_AMBULATORY_CARE_PROVIDER_SITE_OTHER): Payer: Medicare HMO | Admitting: Family Medicine

## 2020-05-19 VITALS — BP 120/68 | HR 89 | Temp 98.0°F | Resp 18 | Ht 76.0 in | Wt 220.9 lb

## 2020-05-19 DIAGNOSIS — N184 Chronic kidney disease, stage 4 (severe): Secondary | ICD-10-CM

## 2020-05-19 DIAGNOSIS — F988 Other specified behavioral and emotional disorders with onset usually occurring in childhood and adolescence: Secondary | ICD-10-CM

## 2020-05-19 DIAGNOSIS — I10 Essential (primary) hypertension: Secondary | ICD-10-CM | POA: Diagnosis not present

## 2020-05-19 DIAGNOSIS — E890 Postprocedural hypothyroidism: Secondary | ICD-10-CM

## 2020-05-19 DIAGNOSIS — E782 Mixed hyperlipidemia: Secondary | ICD-10-CM

## 2020-05-19 DIAGNOSIS — N2581 Secondary hyperparathyroidism of renal origin: Secondary | ICD-10-CM | POA: Diagnosis not present

## 2020-05-19 DIAGNOSIS — I482 Chronic atrial fibrillation, unspecified: Secondary | ICD-10-CM

## 2020-05-19 DIAGNOSIS — Z5181 Encounter for therapeutic drug level monitoring: Secondary | ICD-10-CM

## 2020-05-19 DIAGNOSIS — G4700 Insomnia, unspecified: Secondary | ICD-10-CM

## 2020-05-19 DIAGNOSIS — F339 Major depressive disorder, recurrent, unspecified: Secondary | ICD-10-CM

## 2020-05-19 MED ORDER — LEVOTHYROXINE SODIUM 88 MCG PO TABS: 88.0000 ug | ORAL_TABLET | Freq: Every day | ORAL | 3 refills | Status: DC

## 2020-05-19 MED ORDER — METHYLPHENIDATE HCL 5 MG PO TABS: ORAL_TABLET | ORAL | 0 refills | Status: DC

## 2020-05-19 MED ORDER — MIRTAZAPINE 15 MG PO TABS: 15.0000 mg | ORAL_TABLET | Freq: Every day | ORAL | 3 refills | Status: DC

## 2020-05-19 NOTE — Progress Notes (Signed)
Name: Duane [name]   MRN: [id number]    DOB: October 24, 1950   Date:05/19/2020       Progress Note  Chief Complaint  Patient presents with  . Follow-up    3 months  . Hypertension  . Hypothyroidism  . Hyperlipidemia  . Depression  . ADHD  . Medication Refill     Subjective:   Duane [name] is a 69 y.o. male, presents to clinic for routine follow-up and med refills as noted above, he is here with his wife  Hypothyroidism: Current Medication Regimen: 88 mcg synthroid Takes medicine daily in the morning on empty stomach Current Symptoms: denies fatigue, weight changes, heat/cold intolerance, bowel/skin changes or CVS -no observed or significant changes from his baseline symptoms Most recent results are below; we will not be repeating labs today. Lab Results  Component Value Date   TSH 0.987 01/22/2020   Hypertension:  Currently managed on losartan 25 mg daily Pt reports good med compliance and denies any SE.  No lightheadedness, hypotension, syncope. Blood pressure today is well controlled. BP Readings from Last 3 Encounters:  05/19/20 120/68  02/17/20 120/60  01/25/20 (!) 141/76  Pt denies CP, SOB, exertional sx, LE edema, palpitation, Ha's, visual disturbances  Hyperlipidemia: Currently treated with pravastatin 40, pt reports good med compliance Last Lipids: Lab Results  Component Value Date   CHOL 185 09/17/2019   HDL 35 (L) 09/17/2019   LDLCALC 124 (H) 09/17/2019   TRIG 146 09/17/2019   CHOLHDL 5.3 (H) 09/17/2019   - Denies: Chest pain, shortness of breath, myalgias, claudication   MDD/ADD -secondary to CVA Patient and his wife report that moods are stable, they are using Ritalin most of the time only once daily but was previously on 2-3 times a day.  There have been no worsening of his mood or any increased inattentiveness or distractibility -patient has no complaints of side effects with medications.  He is also taking Remeron at bedtime to help  with sleep. Depression screen [facility] 2/9 05/19/2020 02/17/2020 09/17/2019  Decreased Interest 0 0 0  Down, Depressed, Hopeless 0 0 0  PHQ - 2 Score 0 0 0  Altered sleeping 0 0 0  Tired, decreased energy 0 0 0  Change in appetite 0 0 0  Feeling bad or failure about yourself  0 0 0  Trouble concentrating 0 0 0  Moving slowly or fidgety/restless 0 0 0  Suicidal thoughts 0 0 0  PHQ-9 Score 0 0 0  Difficult doing work/chores Not difficult at all Not difficult at all Not difficult at all  Some recent data might be hidden  PHQ was reviewed today negative  CKD stage III -has been stage IV in the past -managed by Dr. Holley Raring of [facility] kidney -reviewed most recent office visit which was last month.  Dr. Holley Raring is also managing his secondary hyperparathyroid and anemia of chronic disease Patient is taking 300 mg allopurinol daily I did review his most recent labs through care everywhere and through our EMR GFR has been stable 29-33 Anemia has been stable with hemoglobin 12.7-13.7  Patient denies any palpitations, chest pain, near syncope, exertional symptoms, lower extremity edema He and his wife additionally denies any bleeding concerns-no melena, hematochezia, rapid heart rate, cold intolerance, pallor  Reviewed labs office visit and plan from nephrology from 05/01/2020 OV:  Duane [name] is a 69 y.o. 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  1. Chronic kidney disease stage IIIb. The patient's chronic kidney disease remained stable most recent EGFR ranging between 29-31. Follow-up renal parameters today. Maintain the patient on losartan 25 mg daily.  2. Hypertension. Blood pressure a bit low today. He did receive Covid  vaccine on Friday and reports a bit of dehydration. For now continue current doses of doxazosin losartan, and diltiazem.  3. Anemia of chronic kidney disease. Most recent hemoglobin was 12.7. No indication for Procrit. Continue to monitor CBC.  4. Secondary hyperparathyroidism. Motion PTH was 53, phosphorus 3.6, calcium 9.0. Maintain the patient on calcitriol 0.25 mcg p.o. daily.  5. Gout. Most recent serum uric acid level was 4.9. Patient maintained on allopurinol 300 mg daily.  History of middle cerebral artery CVA, chronic A. Fib-patient is seeing cardiology at [facility], Dr. Ferd Hibbs fib is managed with diltiazem and Eliquis Also reviewed most recent cardiology office visit notes through care everywhere   Current Outpatient Medications:  .  allopurinol (ZYLOPRIM) 300 MG tablet, Take 300 mg by mouth daily., Disp: , Rfl:  .  apixaban (ELIQUIS) 5 MG TABS tablet, Take 1 tablet (5 mg total) by mouth 2 (two) times daily., Disp: 180 tablet, Rfl: 3 .  ascorbic acid (VITAMIN C) 500 MG tablet, Take 1 tablet (500 mg total) by mouth daily., Disp: 30 tablet, Rfl: 0 .  calcitRIOL (ROCALTROL) 0.25 MCG capsule, Take 0.25 mcg daily by mouth. , Disp: , Rfl:  .  Cholecalciferol (VITAMIN D3) 2000 units capsule, Take 2,000 Units by mouth daily., Disp: , Rfl:  .  cyanocobalamin 500 MCG tablet, Take 500 mcg by mouth daily., Disp: , Rfl:  .  diltiazem (CARDIZEM CD) 120 MG 24 hr capsule, Take 1 capsule (120 mg total) by mouth daily., Disp: 30 capsule, Rfl: 0 .  doxazosin (CARDURA) 1 MG tablet, Take 1 tablet by mouth once daily, Disp: 90 tablet, Rfl: 3 .  famotidine (PEPCID) 20 MG tablet, Take 1 tablet by mouth twice daily, Disp: 180 tablet, Rfl: 0 .  ferrous sulfate 325 (65 FE) MG tablet, Take 325 mg by mouth daily with breakfast. , Disp: , Rfl:  .  FLUoxetine (PROZAC) 20 MG tablet, Take 1 tablet by mouth once daily, Disp: 90 tablet, Rfl: 3 .  levothyroxine (SYNTHROID) 88 MCG tablet, Take 1 tablet by mouth  once daily, Disp: 90 tablet, Rfl: 0 .  losartan (COZAAR) 25 MG tablet, Take 1 tablet (25 mg total) by mouth daily., Disp: 90 tablet, Rfl: 1 .  methylphenidate (RITALIN) 5 MG tablet, Take 5 mg po qam and may take 98m po prn in afternoon, Disp: 60 tablet, Rfl: 0 .  mirtazapine (REMERON) 15 MG tablet, TAKE 1 TABLET BY MOUTH AT BEDTIME, Disp: 90 tablet, Rfl: 0 .  pravastatin (PRAVACHOL) 40 MG tablet, Take 1 tablet by mouth once daily, Disp: 90 tablet, Rfl: 3 .  zinc sulfate 220 (50 Zn) MG capsule, Take 1 capsule (220 mg total) by mouth daily., Disp: 30 capsule, Rfl: 0  Patient Active Problem List   Diagnosis Date Noted  . AKI (acute kidney injury) ([facility] 02/18/2020  . Hypomagnesemia   . COVID-19 virus infection   . Atrial fibrillation with RVR ([facility] 01/22/2020  . Episode of recurrent major depressive disorder ([facility] 06/11/2019  . Aortic ectasia, abdominal ([facility] 05/22/2018  . Hyperparathyroidism, secondary renal ([place] 10/02/2017  .  Bursitis of shoulder 06/23/2017  . Controlled substance agreement signed 05/23/2017  . On stimulant medication 02/22/2017  . Chronic low back pain without sciatica 08/21/2016  . Low libido 05/23/2016  . Colon polyp 11/27/2015  . Vitamin D deficiency 09/04/2015  . 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 Anemia, chronic renal failure 06/09/2015  . Chronic kidney disease, stage IV (severe) ([place])   . Hypertension   . Chronic gout due to renal impairment   . Hyperlipidemia   . Hypothyroidism following radioiodine therapy   . Restrictive lung disease   . A-fib ([place])   . History of gout 02/28/2014  . Dysphagia following cerebral infarction 02/21/2014  . History of arterial ischemic stroke 02/21/2014  . Left hemiparesis ([place]) 02/11/2014    Past Surgical History:  Procedure Laterality Date  . CATARACT EXTRACTION W/PHACO Right 08/27/2016   Procedure: CATARACT EXTRACTION PHACO AND INTRAOCULAR LENS PLACEMENT (IOC);  Surgeon: Eulogio Bear, MD;  Location:  [facility];  Service: Ophthalmology;  Laterality: Right;  RIGHT  . ENTEROSTOMY CLOSURE  11/27/15   ileostomy takedown  . EXPLORATORY LAPAROTOMY  11/27/15  . illeostomy  07/2014  . POLYPECTOMY  10/15   GI surgery to remove polyp    Family History  Problem Relation Age of Onset  . Arthritis Mother   . Heart disease Mother   . Heart attack Father   . Heart disease Father   . Cirrhosis Sister   . Hyperparathyroidism Neg Hx     Social History   Tobacco Use  . Smoking status: Never Smoker  . Smokeless tobacco: Never Used  . Tobacco comment: smoking cessation materials not required  Vaping Use  . Vaping Use: Never used  Substance Use Topics  . Alcohol use: No  . Drug use: No     Allergies  Allergen Reactions  . Bactrim [Sulfamethoxazole-Trimethoprim] Other (See Comments)    Hyperkalemia  . Chlorpromazine Other (See Comments)    Hiccups, hallucinations  . Cyclobenzaprine Other (See Comments)    Sleeps for 8 hrs after taking it  . Oxycodone Other (See Comments)    Excessive sleepiness    Health Maintenance  Topic Date Due  . INFLUENZA VACCINE  05/14/2020  . COVID-19 Vaccine (2 - Moderna 2-dose series) 05/26/2020  . COLONOSCOPY  07/07/2024  . TETANUS/TDAP  01/09/2026  . Hepatitis C Screening  Completed  . PNA vac Low Risk Adult  Completed    Chart Review Today: I personally reviewed active problem list, medication list, allergies, family history, social history, health maintenance, notes from last encounter, lab results, imaging with the patient/caregiver today.   Review of Systems  10 Systems reviewed and are negative for acute change except as noted in the HPI.  Objective:   Vitals:   05/19/20 1445  BP: 120/68  Pulse: 89  Resp: 18  Temp: 98 F (36.7 C)  SpO2: 99%  Weight: 220 lb 14.4 oz (100.2 kg)  Height: 6' 4" (1.93 m)    Body mass index is 26.89 kg/m.  Physical Exam Vitals and nursing note reviewed.  Constitutional:      General: He  is not in acute distress.    Appearance: He is not toxic-appearing or diaphoretic.     Comments: Chronically ill appearing male, at his baseline  HENT:     Head: Normocephalic and atraumatic.  Eyes:     General: No scleral icterus.       Right eye: No discharge.  Left eye: No discharge.     Conjunctiva/sclera: Conjunctivae normal.  Cardiovascular:     Rate and Rhythm: Normal rate. Rhythm irregularly irregular.     Pulses: Normal pulses.          Radial pulses are 2+ on the right side and 2+ on the left side.     Heart sounds: Normal heart sounds. No murmur heard.  No friction rub. No gallop.   Pulmonary:     Effort: Pulmonary effort is normal. No respiratory distress.     Breath sounds: Normal breath sounds. No stridor. No wheezing, rhonchi or rales.  Abdominal:     General: Bowel sounds are normal. There is no distension.     Palpations: Abdomen is soft.  Musculoskeletal:     Right lower leg: No edema.     Left lower leg: No edema.  Skin:    General: Skin is warm and dry.     Capillary Refill: Capillary refill takes less than 2 seconds.  Neurological:     Mental Status: He is alert. Mental status is at baseline.  Psychiatric:        Mood and Affect: Mood normal. Mood is not anxious or depressed. Affect is flat. Affect is not inappropriate.        Behavior: Behavior is cooperative.         Assessment & Plan:     ICD-10-CM   1. Essential hypertension  I10    Stable, well-controlled -also managed and monitored by cardiology and nephrology, recent labs reviewed through care everywhere  2. Mixed hyperlipidemia  E78.2 Lipid panel    Hepatic function panel   Compliant with pravastatin no side effects or concerns will repeat lipid panel and CMP at end of the year (due annually last done December)  3. Chronic kidney disease, stage IV (severe) (HCC)  N18.4    Renal function has been stable, seeing Dr. Holley Raring  4. Hyperparathyroidism, secondary renal ([place])  N25.81    Per  Dr. Holley Raring, on allopurinol 300 mg daily  5. Hypothyroidism following radioiodine therapy  E89.0 levothyroxine (SYNTHROID) 88 MCG tablet   Last labs were about 3 months ago, symptoms have been stable no concerns, weight stable refill 88 mcg levothyroxine no labs today  6. Episode of recurrent major depressive disorder, unspecified depression episode severity ([place])  F33.9    Moods stable with Remeron, Prozac and methylphenidate  7. Attention deficit disorder (ADD) without hyperactivity  F98.8 methylphenidate (RITALIN) 5 MG tablet   Refill of methylphenidate, no concerning side effects when taking medications, very low dose, consistent prescribing, controlled substance database reviewed   8. Insomnia, unspecified type  G47.00 mirtazapine (REMERON) 15 MG tablet   Well-controlled with Remeron -refills ordered  9. Chronic atrial fibrillation (HCC)  I48.20    Per cardiology, managed with diltiazem and Eliquis -patient denies any symptoms, no bleeding concerns, anemia stable  10. Encounter for medication monitoring  Z51.81    Requested the patient do his lipid panel in December later this year invited him and his wife to come into clinic to do labs and his routine follow-up or to do labs separately and do a virtual visit at that time in case of a Covid surge    Return for 4-6 month f/up virtual ok for HLD, ADD, depression, hypothyroid .   Delsa Grana, PA-C 05/19/20 3:03 PM

## 2020-05-19 NOTE — Patient Instructions (Signed)
Due for lipid panel in December  - can do labs in clinic and do a routine follow up from home if you would like.  I would like to see you back in 4-6 months

## 2020-06-13 ENCOUNTER — Encounter: Payer: Self-pay | Admitting: Family Medicine

## 2020-06-13 MED ORDER — METHYLPHENIDATE HCL 5 MG PO TABS: ORAL_TABLET | ORAL | 0 refills | Status: DC

## 2020-06-22 ENCOUNTER — Ambulatory Visit (INDEPENDENT_AMBULATORY_CARE_PROVIDER_SITE_OTHER): Payer: Medicare HMO

## 2020-06-22 ENCOUNTER — Other Ambulatory Visit: Payer: Self-pay

## 2020-06-22 VITALS — BP 128/62 | HR 66 | Temp 98.4°F | Resp 16 | Ht 76.0 in | Wt 217.4 lb

## 2020-06-22 DIAGNOSIS — Z0000 Encounter for general adult medical examination without abnormal findings: Secondary | ICD-10-CM

## 2020-06-22 DIAGNOSIS — Z23 Encounter for immunization: Secondary | ICD-10-CM

## 2020-06-22 NOTE — Progress Notes (Signed)
Subjective:   Duane [name] is a 69 y.o. male who presents for Medicare Annual/Subsequent preventive examination.  Review of Systems     Cardiac Risk Factors include: advanced age (>21men, >63 women);dyslipidemia;male gender;hypertension     Objective:    Today's Vitals   06/22/20 1055  BP: 128/62  Pulse: 66  Resp: 16  Temp: 98.4 F (36.9 C)  TempSrc: Oral  SpO2: 100%  Weight: 217 lb 6.4 oz (98.6 kg)  Height: 6\' 4"  (1.93 m)   Body mass index is 26.46 kg/m.  Advanced Directives 06/22/2020 01/21/2020 06/17/2019 03/12/2018 05/23/2017 02/19/2017 12/26/2016  Does Patient Have a Medical Advance Directive? No No No No No No No  Would patient like information on creating a medical advance directive? No - Patient declined No - Patient declined Yes (MAU/Ambulatory/Procedural Areas - Information given) Yes (MAU/Ambulatory/Procedural Areas - Information given) - - -    Current Medications (verified) Outpatient Encounter Medications as of 06/22/2020  Medication Sig   allopurinol (ZYLOPRIM) 300 MG tablet Take 300 mg by mouth daily.   apixaban (ELIQUIS) 5 MG TABS tablet Take 1 tablet (5 mg total) by mouth 2 (two) times daily.   Ascorbic Acid (VITAMIN C) 1000 MG tablet Take 1 tablet by mouth daily.   calcitRIOL (ROCALTROL) 0.25 MCG capsule Take 0.25 mcg daily by mouth.    Cholecalciferol (VITAMIN D3) 2000 units capsule Take 2,000 Units by mouth daily.   cyanocobalamin 500 MCG tablet Take 500 mcg by mouth daily.   diltiazem (CARDIZEM CD) 120 MG 24 hr capsule Take 1 capsule (120 mg total) by mouth daily.   doxazosin (CARDURA) 1 MG tablet Take 1 tablet by mouth once daily   famotidine (PEPCID) 20 MG tablet Take 1 tablet by mouth twice daily   ferrous sulfate 325 (65 FE) MG tablet Take 325 mg by mouth daily with breakfast.    FLUoxetine (PROZAC) 20 MG tablet Take 1 tablet by mouth once daily   levothyroxine (SYNTHROID) 88 MCG tablet Take 1 tablet (88 mcg total) by mouth daily.    losartan (COZAAR) 25 MG tablet Take 1 tablet (25 mg total) by mouth daily.   methylphenidate (RITALIN) 5 MG tablet Take 5 mg po qam and may take 5mg  po prn in afternoon   methylphenidate (RITALIN) 5 MG tablet Take 5 mg po every morning and may take 5 mg po as needed in afternoon   mirtazapine (REMERON) 15 MG tablet Take 1 tablet (15 mg total) by mouth at bedtime.   pravastatin (PRAVACHOL) 40 MG tablet Take 1 tablet by mouth once daily   [DISCONTINUED] ascorbic acid (VITAMIN C) 500 MG tablet Take 1 tablet (500 mg total) by mouth daily.   [DISCONTINUED] zinc sulfate 220 (50 Zn) MG capsule Take 1 capsule (220 mg total) by mouth daily.   No facility-administered encounter medications on file as of 06/22/2020.    Allergies (verified) Bactrim [sulfamethoxazole-trimethoprim], Chlorpromazine, Cyclobenzaprine, and Oxycodone   History: Past Medical History:  Diagnosis Date   A-fib [facility])    requiring life-long anticouagulation   Aortic ectasia, abdominal ([place]) 05/22/2018   [place] Sept 2018   Cerebellar infarct [facility])    CKD (chronic kidney disease)    creatinine baseline 1.3-1.5 in 2015   GERD (gastroesophageal reflux disease)    Gout    Graves disease    H/O hyperkalemia    with Bactrim   Hyperlipidemia    requiring life long statin therapy   Hyperparathyroidism, secondary renal ([place]) 10/02/2017   Hypertension  Hypothyroidism    Left hemiparesis [facility]) May 2015   s/p strke   Poor short term memory    Renal insufficiency    Restrictive lung disease    mild   Stroke ([place]) 02/20/14   left side hemiparesis, dysphagia   UTI (urinary tract infection) due to Enterococcus May 2015   Vitamin D deficiency disease    Past Surgical History:  Procedure Laterality Date   CATARACT EXTRACTION W/PHACO Right 08/27/2016   Procedure: CATARACT EXTRACTION PHACO AND INTRAOCULAR LENS PLACEMENT ([place]);  Surgeon: Eulogio Bear, MD;  Location: [place];  Service:  Ophthalmology;  Laterality: Right;  RIGHT   ENTEROSTOMY CLOSURE  11/27/15   ileostomy takedown   EXPLORATORY LAPAROTOMY  11/27/15   illeostomy  07/2014   POLYPECTOMY  10/15   GI surgery to remove polyp   Family History  Problem Relation Age of Onset   Arthritis Mother    Heart disease Mother    Heart attack Father    Heart disease Father    Cirrhosis Sister    Hyperparathyroidism Neg Hx    Social History   Socioeconomic History   Marital status: Married    Spouse name: Curt Bears   Number of children: 2   Years of education: Not on file   Highest education level: 12th grade  Occupational History   Occupation: Retired  Tobacco Use   Smoking status: Never Smoker   Smokeless tobacco: Never Used   Tobacco comment: smoking cessation materials not required  Vaping Use   Vaping Use: Never used  Substance and Sexual Activity   Alcohol use: No   Drug use: No   Sexual activity: Not Currently  Other Topics Concern   Not on file  Social History Narrative   Not on file   Social Determinants of Health   Financial Resource Strain: Low Risk    Difficulty of Paying Living Expenses: Not hard at all  Food Insecurity: No Food Insecurity   Worried About Charity fundraiser in the Last Year: Never true   [place] in the Last Year: Never true  Transportation Needs: No Transportation Needs   Lack of Transportation (Medical): No   Lack of Transportation (Non-Medical): No  Physical Activity: Inactive   Days of Exercise per Week: 0 days   Minutes of Exercise per Session: 0 min  Stress: No Stress Concern Present   Feeling of Stress : Not at all  Social Connections: Unknown   Frequency of Communication with Friends and Family: Patient refused   Frequency of Social Gatherings with Friends and Family: Patient refused   Attends Religious Services: Patient refused   Printmaker: Patient refused   Attends Programme researcher, broadcasting/film/video: Patient refused   Marital Status: Married    Tobacco Counseling Counseling given: Not Answered Comment: smoking cessation materials not required   Clinical Intake:  Pre-visit preparation completed: Yes  Pain : No/denies pain     BMI - recorded: 26.46 Nutritional Status: BMI 25 -29 Overweight Nutritional Risks: None Diabetes: No  How often do you need to have someone help you when you read instructions, pamphlets, or other written materials from your doctor or pharmacy?: 1 - Never    Interpreter Needed?: No  Information entered by :: Clemetine Marker LPN   Activities of Daily Living In your present state of health, do you have any difficulty performing the following activities: 06/22/2020 05/19/2020  Hearing? N N  Comment declines hearing  aids -  Vision? N N  Difficulty concentrating or making decisions? N N  Walking or climbing stairs? N N  Dressing or bathing? N N  Doing errands, shopping? N N  Preparing Food and eating ? N -  Using the Toilet? N -  In the past six months, have you accidently leaked urine? N -  Do you have problems with loss of bowel control? N -  Managing your Medications? N -  Managing your Finances? N -  Housekeeping or managing your Housekeeping? N -  Some recent data might be hidden    Patient Care Team: Delsa Grana, PA-C as PCP - General (Family Medicine) Ubaldo Glassing Javier Docker, MD as Consulting Physician (Cardiology) Anthonette Legato, MD (Internal Medicine) Emmaline Kluver., MD as Consulting Physician (Rheumatology) Jonathon Bellows, MD as Consulting Physician (Gastroenterology)  Indicate any recent Medical Services you may have received from other than [facility] providers in the past year (date may be approximate).     Assessment:   This is a routine wellness examination for [place].  Hearing/Vision screen  Hearing Screening   125Hz  250Hz  500Hz  1000Hz  2000Hz  3000Hz  4000Hz  6000Hz  8000Hz   Right ear:           Left ear:            Comments: Pt denies hearing difficulty  Vision Screening Comments: [facility] screen[facility] at [facility]  Dietary issues and exercise activities discussed: Current Exercise Habits: The patient does not participate in regular exercise at present, Exercise limited by: neurologic condition(s)  Goals     DIET - INCREASE WATER INTAKE     Recommend to drink at least 6-8 8oz glasses of water per day.      Depression Screen PHQ 2/9 Scores 06/22/2020 05/19/2020 02/17/2020 09/17/2019 06/17/2019 06/11/2019 03/12/2019  PHQ - 2 Score 0 0 0 0 0 0 0  PHQ- 9 Score - 0 0 0 - 0 0    Fall Risk Fall Risk  06/22/2020 05/19/2020 02/17/2020 09/17/2019 06/17/2019  Falls in the past year? 0 0 0 0 0  Number falls in past yr: 0 0 0 0 0  Injury with Fall? 0 0 0 0 0  Risk for fall due to : No Fall Risks - - - -  Follow up Falls prevention discussed - - - Falls prevention discussed    Any stairs in or around the home? Yes  If so, are there any without handrails? No  Home free of loose throw rugs in walkways, pet beds, electrical cords, etc? Yes  Adequate lighting in your home to reduce risk of falls? Yes   ASSISTIVE DEVICES UTILIZED TO PREVENT FALLS:  Life alert? No  Use of a cane, walker or w/c? No  Grab bars in the bathroom? Yes  Shower chair or bench in shower? Yes  Elevated toilet seat or a handicapped toilet? Yes   TIMED UP AND GO:  Was the test performed? Yes .  Length of time to ambulate 10 feet: 5 sec.   Gait steady and fast without use of assistive device  Cognitive Function:     6CIT Screen 06/22/2020 06/17/2019 03/12/2018  What Year? 0 points 0 points 0 points  What month? 0 points 0 points 0 points  What time? 0 points 0 points 3 points  Count back from 20 0 points 0 points 0 points  Months in reverse 0 points 0 points 0 points  Repeat phrase 0 points 0 points 4 points  Total Score 0  0 7    Immunizations Immunization History  Administered Date(s) Administered   Fluad Quad(high Dose  65+) 08/02/2019, 06/22/2020   Influenza, High Dose Seasonal PF 11/21/2016, 09/09/2017, 08/21/2018   Moderna SARS-COVID-2 Vaccination 04/28/2020, 05/26/2020   Pneumococcal Conjugate-13 12/26/2016   Pneumococcal Polysaccharide-23 03/12/2018   Tdap 01/10/2016    TDAP status: Up to date   Flu Vaccine status: Completed at today's visit   Pneumococcal vaccine status: Up to date   Covid-19 vaccine status: Completed vaccines  Qualifies for Shingles Vaccine? Yes   Zostavax completed No   Shingrix Completed?: No.    Education has been provided regarding the importance of this vaccine. Patient has been advised to call insurance company to determine out of pocket expense if they have not yet received this vaccine. Advised may also receive vaccine at local pharmacy or Health Dept. Verbalized acceptance and understanding.  Screening Tests Health Maintenance  Topic Date Due   COLONOSCOPY  07/07/2024   TETANUS/TDAP  01/09/2026   INFLUENZA VACCINE  Completed   COVID-19 Vaccine  Completed   Hepatitis C Screening  Completed   PNA vac Low Risk Adult  Completed    Health Maintenance  There are no preventive care reminders to display for this patient.  Colorectal cancer screening: Completed 07/07/14. Repeat every 10 years  Lung Cancer Screening: (Low Dose CT Chest recommended if Age 15-80 years, 30 pack-year currently smoking OR have quit w/in 15years.) does not qualify.   Additional Screening:  Hepatitis C Screening: does qualify; Completed 06/23/17  Vision Screening: Recommended annual ophthalmology exams for early detection of glaucoma and other disorders of the eye. Is the patient up to date with their annual eye exam?  Yes  Who is the provider or what is the name of the office in which the patient attends annual eye exams? [place] Screening: Recommended annual dental exams for proper oral hygiene  Community Resource Referral / Chronic Care Management: CRR  required this visit?  No   CCM required this visit?  No      Plan:     I have personally reviewed and noted the following in the patients chart:    Medical and social history  Use of alcohol, tobacco or illicit drugs   Current medications and supplements  Functional ability and status  Nutritional status  Physical activity  Advanced directives  List of other physicians  Hospitalizations, surgeries, and ER visits in previous 12 months  Vitals  Screenings to include cognitive, depression, and falls  Referrals and appointments  In addition, I have reviewed and discussed with patient certain preventive protocols, quality metrics, and best practice recommendations. A written personalized care plan for preventive services as well as general preventive health recommendations were provided to patient.     Clemetine Marker, LPN   11/23/5619   Nurse Notes: pt accompanied to visit by his wife Curt Bears; states he is overall doing well and appreciative of visit today.

## 2020-06-22 NOTE — Patient Instructions (Signed)
Duane [name] , Thank you for taking time to come for your Medicare Wellness Visit. I appreciate your ongoing commitment to your health goals. Please review the following plan we discussed and let me know if I can assist you in the future.   Screening recommendations/referrals: Colonoscopy: done 07/07/14. Repeat in 2025. Recommended yearly ophthalmology/optometry visit for glaucoma screening and checkup Recommended yearly dental visit for hygiene and checkup  Vaccinations: Influenza vaccine: done today Pneumococcal vaccine: done 03/12/18 Tdap vaccine: done 01/10/16 Shingles vaccine: Shingrix discussed. Please contact your pharmacy for coverage information.  Covid-19:  Done 04/28/20 & 05/26/20  Advanced directives: Advance directive discussed with you today. Even though you declined this today please call our office should you change your mind and we can give you the proper paperwork for you to fill out.  Conditions/risks identified: Recommend drinking 6-8 glasses of water per day   Next appointment: Follow up in one year for your annual wellness visit.   Preventive Care 69 Years and Older, Male Preventive care refers to lifestyle choices and visits with your health care provider that can promote health and wellness. What does preventive care include?  A yearly physical exam. This is also called an annual well check.  Dental exams once or twice a year.  Routine eye exams. Ask your health care provider how often you should have your eyes checked.  Personal lifestyle choices, including:  Daily care of your teeth and gums.  Regular physical activity.  Eating a healthy diet.  Avoiding tobacco and drug use.  Limiting alcohol use.  Practicing safe sex.  Taking low doses of aspirin every day.  Taking vitamin and mineral supplements as recommended by your health care provider. What happens during an annual well check? The services and screenings done by your health care provider  during your annual well check will depend on your age, overall health, lifestyle risk factors, and family history of disease. Counseling  Your health care provider may ask you questions about your:  Alcohol use.  Tobacco use.  Drug use.  Emotional well-being.  Home and relationship well-being.  Sexual activity.  Eating habits.  History of falls.  Memory and ability to understand (cognition).  Work and work Statistician. Screening  You may have the following tests or measurements:  Height, weight, and BMI.  Blood pressure.  Lipid and cholesterol levels. These may be checked every 5 years, or more frequently if you are over 64 years old.  Skin check.  Lung cancer screening. You may have this screening every year starting at age 66 if you have a 30-pack-year history of smoking and currently smoke or have quit within the past 15 years.  Fecal occult blood test (FOBT) of the stool. You may have this test every year starting at age 39.  Flexible sigmoidoscopy or colonoscopy. You may have a sigmoidoscopy every 5 years or a colonoscopy every 10 years starting at age 53.  Prostate cancer screening. Recommendations will vary depending on your family history and other risks.  Hepatitis C blood test.  Hepatitis B blood test.  Sexually transmitted disease (STD) testing.  Diabetes screening. This is done by checking your blood sugar (glucose) after you have not eaten for a while (fasting). You may have this done every 1-3 years.  Abdominal aortic aneurysm (AAA) screening. You may need this if you are a current or former smoker.  Osteoporosis. You may be screened starting at age 83 if you are at high risk. Talk with your health care  provider about your test results, treatment options, and if necessary, the need for more tests. Vaccines  Your health care provider may recommend certain vaccines, such as:  Influenza vaccine. This is recommended every year.  Tetanus,  diphtheria, and acellular pertussis (Tdap, Td) vaccine. You may need a Td booster every 10 years.  Zoster vaccine. You may need this after age 22.  Pneumococcal 13-valent conjugate (PCV13) vaccine. One dose is recommended after age 18.  Pneumococcal polysaccharide (PPSV23) vaccine. One dose is recommended after age 72. Talk to your health care provider about which screenings and vaccines you need and how often you need them. This information is not intended to replace advice given to you by your health care provider. Make sure you discuss any questions you have with your health care provider. Document Released: 10/27/2015 Document Revised: 06/19/2016 Document Reviewed: 08/01/2015 Elsevier Interactive Patient Education  2017 [place] Prevention in the Home Falls can cause injuries. They can happen to people of all ages. There are many things you can do to make your home safe and to help prevent falls. What can I do on the outside of my home?  Regularly fix the edges of walkways and driveways and fix any cracks.  Remove anything that might make you trip as you walk through a door, such as a raised step or threshold.  Trim any bushes or trees on the path to your home.  Use bright outdoor lighting.  Clear any walking paths of anything that might make someone trip, such as rocks or tools.  Regularly check to see if handrails are loose or broken. Make sure that both sides of any steps have handrails.  Any raised decks and porches should have guardrails on the edges.  Have any leaves, snow, or ice cleared regularly.  Use sand or salt on walking paths during winter.  Clean up any spills in your garage right away. This includes oil or grease spills. What can I do in the bathroom?  Use night lights.  Install grab bars by the toilet and in the tub and shower. Do not use towel bars as grab bars.  Use non-skid mats or decals in the tub or shower.  If you need to sit down in  the shower, use a plastic, non-slip stool.  Keep the floor dry. Clean up any water that spills on the floor as soon as it happens.  Remove soap buildup in the tub or shower regularly.  Attach bath mats securely with double-sided non-slip rug tape.  Do not have throw rugs and other things on the floor that can make you trip. What can I do in the bedroom?  Use night lights.  Make sure that you have a light by your bed that is easy to reach.  Do not use any sheets or blankets that are too big for your bed. They should not hang down onto the floor.  Have a firm chair that has side arms. You can use this for support while you get dressed.  Do not have throw rugs and other things on the floor that can make you trip. What can I do in the kitchen?  Clean up any spills right away.  Avoid walking on wet floors.  Keep items that you use a lot in easy-to-reach places.  If you need to reach something above you, use a strong step stool that has a grab bar.  Keep electrical cords out of the way.  Do not use floor polish  or wax that makes floors slippery. If you must use wax, use non-skid floor wax.  Do not have throw rugs and other things on the floor that can make you trip. What can I do with my stairs?  Do not leave any items on the stairs.  Make sure that there are handrails on both sides of the stairs and use them. Fix handrails that are broken or loose. Make sure that handrails are as long as the stairways.  Check any carpeting to make sure that it is firmly attached to the stairs. Fix any carpet that is loose or worn.  Avoid having throw rugs at the top or bottom of the stairs. If you do have throw rugs, attach them to the floor with carpet tape.  Make sure that you have a light switch at the top of the stairs and the bottom of the stairs. If you do not have them, ask someone to add them for you. What else can I do to help prevent falls?  Wear shoes that:  Do not have high  heels.  Have rubber bottoms.  Are comfortable and fit you well.  Are closed at the toe. Do not wear sandals.  If you use a stepladder:  Make sure that it is fully opened. Do not climb a closed stepladder.  Make sure that both sides of the stepladder are locked into place.  Ask someone to hold it for you, if possible.  Clearly mark and make sure that you can see:  Any grab bars or handrails.  First and last steps.  Where the edge of each step is.  Use tools that help you move around (mobility aids) if they are needed. These include:  Canes.  Walkers.  Scooters.  Crutches.  Turn on the lights when you go into a dark area. Replace any light bulbs as soon as they burn out.  Set up your furniture so you have a clear path. Avoid moving your furniture around.  If any of your floors are uneven, fix them.  If there are any pets around you, be aware of where they are.  Review your medicines with your doctor. Some medicines can make you feel dizzy. This can increase your chance of falling. Ask your doctor what other things that you can do to help prevent falls. This information is not intended to replace advice given to you by your health care provider. Make sure you discuss any questions you have with your health care provider. Document Released: 07/27/2009 Document Revised: 03/07/2016 Document Reviewed: 11/04/2014 Elsevier Interactive Patient Education  2017 [employer].

## 2020-07-21 ENCOUNTER — Other Ambulatory Visit: Payer: Self-pay | Admitting: Hematology and Oncology

## 2020-08-20 ENCOUNTER — Other Ambulatory Visit: Payer: Self-pay | Admitting: Family Medicine

## 2020-08-20 DIAGNOSIS — E890 Postprocedural hypothyroidism: Secondary | ICD-10-CM

## 2020-09-18 ENCOUNTER — Other Ambulatory Visit: Payer: Self-pay | Admitting: Family Medicine

## 2020-09-18 DIAGNOSIS — F988 Other specified behavioral and emotional disorders with onset usually occurring in childhood and adolescence: Secondary | ICD-10-CM

## 2020-09-18 NOTE — Telephone Encounter (Signed)
Medication Refill - Medication: methylphenidate (RITALIN) 5 MG tablet    Has the patient contacted their pharmacy? Yes.   (Agent: If no, request that the patient contact the pharmacy for the refill.) (Agent: If yes, when and what did the pharmacy advise?)  Preferred Pharmacy (with phone number or street name):  [place], [place] [facility] [address]  [place] [address]  Phone: [phone] Fax: [phone]     Agent: Please be advised that RX refills may take up to 3 business days. We ask that you follow-up with your pharmacy.

## 2020-09-18 NOTE — Telephone Encounter (Signed)
Requested medication (s) are due for refill today: yes  Requested medication (s) are on the active medication list: yes   Last refill:  06/19/2020  Future visit scheduled: yes  Notes to clinic: this refill cannot be delegated    Requested Prescriptions  Pending Prescriptions Disp Refills   methylphenidate (RITALIN) 5 MG tablet 60 tablet 0    Sig: Take 5 mg po qam and may take 5mg  po prn in afternoon      Not Delegated - Psychiatry:  Stimulants/ADHD Failed - 09/18/2020 12:48 PM      Failed - This refill cannot be delegated      Failed - Urine Drug Screen completed in last 360 days      Passed - Valid encounter within last 3 months    Recent Outpatient Visits           4 months ago Essential hypertension   [facility] Delsa Grana, PA-C   7 months ago Encounter for examination following treatment at hospital   [facility] Delsa Grana, PA-C   1 year ago Osteopenia of multiple sites   [facility] Delsa Grana, PA-C   1 year ago Essential hypertension   [place], [place], NP   1 year ago Essential hypertension   [place], Bethel Born, NP       Future Appointments             In 2 months Delsa Grana, PA-C [facility] [place]   In 9 months  [facility]

## 2020-09-19 NOTE — Telephone Encounter (Signed)
Patient will need appointment.

## 2020-09-20 NOTE — Telephone Encounter (Signed)
Pt has appt scheduled for 12.8.2021 with [facility]

## 2020-09-21 ENCOUNTER — Other Ambulatory Visit: Payer: Self-pay

## 2020-09-21 ENCOUNTER — Encounter: Payer: Self-pay | Admitting: Family Medicine

## 2020-09-21 ENCOUNTER — Ambulatory Visit (INDEPENDENT_AMBULATORY_CARE_PROVIDER_SITE_OTHER): Payer: Medicare HMO | Admitting: Family Medicine

## 2020-09-21 DIAGNOSIS — F988 Other specified behavioral and emotional disorders with onset usually occurring in childhood and adolescence: Secondary | ICD-10-CM

## 2020-09-21 MED ORDER — METHYLPHENIDATE HCL 5 MG PO TABS: ORAL_TABLET | ORAL | 0 refills | Status: DC

## 2020-09-21 NOTE — Progress Notes (Signed)
Name: Duane [name]   MRN: [id number]    DOB: [birth year]   Date:09/21/2020       Progress Note  Chief Complaint  Patient presents with  . ADHD    Medication refill     Subjective:   Duane [name] is a 69 y.o. male, presents to clinic for Ritalin refills for adhd secondary to stoke. He is still taking 5 mg 1-2 times a day as needed and sometimes skips days last refills were done with August visit about 4 months ago got a prescription in August and September, the controlled substance database was reviewed today, no concerns.  Patient is tolerating medications he denies any rapid heart rate, elevated blood pressure, decreased appetite, weight loss, rapid heart rate palpitations, headaches or mood changes    Current Outpatient Medications:  .  allopurinol (ZYLOPRIM) 300 MG tablet, Take 300 mg by mouth daily., Disp: , Rfl:  .  apixaban (ELIQUIS) 5 MG TABS tablet, Take 1 tablet (5 mg total) by mouth 2 (two) times daily., Disp: 180 tablet, Rfl: 3 .  Ascorbic Acid (VITAMIN C) 1000 MG tablet, Take 1 tablet by mouth daily., Disp: , Rfl:  .  calcitRIOL (ROCALTROL) 0.25 MCG capsule, Take 0.25 mcg daily by mouth. , Disp: , Rfl:  .  Cholecalciferol (VITAMIN D3) 2000 units capsule, Take 2,000 Units by mouth daily., Disp: , Rfl:  .  cyanocobalamin 500 MCG tablet, Take 500 mcg by mouth daily., Disp: , Rfl:  .  diltiazem (CARDIZEM CD) 120 MG 24 hr capsule, Take 1 capsule (120 mg total) by mouth daily., Disp: 30 capsule, Rfl: 0 .  doxazosin (CARDURA) 1 MG tablet, Take 1 tablet by mouth once daily, Disp: 90 tablet, Rfl: 3 .  famotidine (PEPCID) 20 MG tablet, Take 1 tablet by mouth twice daily, Disp: 180 tablet, Rfl: 0 .  ferrous sulfate 325 (65 FE) MG tablet, Take 325 mg by mouth daily with breakfast. , Disp: , Rfl:  .  FLUoxetine (PROZAC) 20 MG tablet, Take 1 tablet by mouth once daily, Disp: 90 tablet, Rfl: 3 .  levothyroxine (SYNTHROID) 88 MCG tablet, Take 1 tablet (88 mcg total) by  mouth daily., Disp: 90 tablet, Rfl: 3 .  losartan (COZAAR) 25 MG tablet, Take 1 tablet (25 mg total) by mouth daily., Disp: 90 tablet, Rfl: 1 .  methylphenidate (RITALIN) 5 MG tablet, Take 5 mg po qam and may take 5mg  po prn in afternoon, Disp: 60 tablet, Rfl: 0 .  methylphenidate (RITALIN) 5 MG tablet, Take 5 mg po every morning and may take 5 mg po as needed in afternoon, Disp: 60 tablet, Rfl: 0 .  mirtazapine (REMERON) 15 MG tablet, Take 1 tablet (15 mg total) by mouth at bedtime., Disp: 90 tablet, Rfl: 3 .  pravastatin (PRAVACHOL) 40 MG tablet, Take 1 tablet by mouth once daily, Disp: 90 tablet, Rfl: 3  Patient Active Problem List   Diagnosis Date Noted  . Hemochromatosis, hereditary ([place]) 07/21/2020  . AKI (acute kidney injury) ([place]) 02/18/2020  . Hypomagnesemia   . COVID-19 virus infection   . Atrial fibrillation with RVR ([place]) 01/22/2020  . Episode of recurrent major depressive disorder ([place]) 06/11/2019  . Aortic ectasia, abdominal ([place]) 05/22/2018  . Hyperparathyroidism, secondary renal ([place]) 10/02/2017  . Bursitis of shoulder 06/23/2017  . Controlled substance agreement signed 05/23/2017  . On stimulant medication 02/22/2017  . Chronic low back pain without sciatica 08/21/2016  . Low libido 05/23/2016  . Colon polyp 11/27/2015  .  Vitamin D deficiency 09/04/2015  . 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 Anemia, chronic renal failure 06/09/2015  . Chronic kidney disease, stage IV (severe) ([place])   . Hypertension   . Chronic gout due to renal impairment   . Hyperlipidemia   . Hypothyroidism following radioiodine therapy   . Restrictive lung disease   . Chronic atrial fibrillation ([place])   . History of gout 02/28/2014  . Dysphagia following cerebral infarction 02/21/2014  . History of arterial ischemic stroke 02/21/2014  . Left hemiparesis ([place]) 02/11/2014    Past Surgical History:  Procedure Laterality Date  . CATARACT EXTRACTION W/PHACO Right 08/27/2016   Procedure:  CATARACT EXTRACTION PHACO AND INTRAOCULAR LENS PLACEMENT (IOC);  Surgeon: Eulogio Bear, MD;  Location: [facility];  Service: Ophthalmology;  Laterality: Right;  RIGHT  . ENTEROSTOMY CLOSURE  11/27/15   ileostomy takedown  . EXPLORATORY LAPAROTOMY  11/27/15  . illeostomy  07/2014  . POLYPECTOMY  10/15   GI surgery to remove polyp    Family History  Problem Relation Age of Onset  . Arthritis Mother   . Heart disease Mother   . Heart attack Father   . Heart disease Father   . Cirrhosis Sister   . Hyperparathyroidism Neg Hx     Social History   Tobacco Use  . Smoking status: Never Smoker  . Smokeless tobacco: Never Used  . Tobacco comment: smoking cessation materials not required  Vaping Use  . Vaping Use: Never used  Substance Use Topics  . Alcohol use: No  . Drug use: No     Allergies  Allergen Reactions  . Bactrim [Sulfamethoxazole-Trimethoprim] Other (See Comments)    Hyperkalemia  . Chlorpromazine Other (See Comments)    Hiccups, hallucinations  . Cyclobenzaprine Other (See Comments)    Sleeps for 8 hrs after taking it  . Oxycodone Other (See Comments)    Excessive sleepiness    Health Maintenance  Topic Date Due  . COVID-19 Vaccine (3 - Moderna risk 4-dose series) 10/07/2020 (Originally 06/23/2020)  . COLONOSCOPY  07/07/2024  . TETANUS/TDAP  01/09/2026  . INFLUENZA VACCINE  Completed  . Hepatitis C Screening  Completed  . PNA vac Low Risk Adult  Completed    Chart Review Today: I personally reviewed active problem list, medication list, allergies, family history, social history, health maintenance, notes from last encounter, lab results, imaging with the patient/caregiver today.   Review of Systems  10 Systems reviewed and are negative for acute change except as noted in the HPI.  Objective:   Vitals:   09/21/20 1541  BP: 124/86  Pulse: 84  Resp: 18  Temp: 98.4 F (36.9 C)  TempSrc: Oral  SpO2: 96%  Weight: 222 lb 1.6 oz (100.7 kg)   Height: 6\' 4"  (1.93 m)    Body mass index is 27.03 kg/m.  Physical Exam Vitals and nursing note reviewed.  Constitutional:      General: He is not in acute distress.    Appearance: Normal appearance. He is not ill-appearing, toxic-appearing or diaphoretic.  HENT:     Head: Normocephalic and atraumatic.  Cardiovascular:     Rate and Rhythm: Normal rate and regular rhythm.     Pulses: Normal pulses.     Heart sounds: Normal heart sounds.  Pulmonary:     Effort: Pulmonary effort is normal.     Breath sounds: Normal breath sounds.  Skin:    Capillary Refill: Capillary refill takes less than 2 seconds.  Neurological:     Mental Status: He is alert. Mental status is at baseline.  Psychiatric:        Mood and Affect: Mood normal.        Behavior: Behavior normal.         Assessment & Plan:     ICD-10-CM   1. Attention deficit disorder (ADD) without hyperactivity  F98.8 methylphenidate (RITALIN) 5 MG tablet    methylphenidate (RITALIN) 5 MG tablet   Refill of methylphenidate, no concerning side effects when taking medications, very low dose, consistent prescribing, controlled substance database reviewed      Pt has routine f/up in Feb 2022, will send in next refills x 6 months at that time  Delsa Grana, PA-C 09/21/20 3:47 PM

## 2020-11-10 DIAGNOSIS — I480 Paroxysmal atrial fibrillation: Secondary | ICD-10-CM | POA: Diagnosis not present

## 2020-11-10 DIAGNOSIS — E782 Mixed hyperlipidemia: Secondary | ICD-10-CM | POA: Diagnosis not present

## 2020-11-10 DIAGNOSIS — Z8673 Personal history of transient ischemic attack (TIA), and cerebral infarction without residual deficits: Secondary | ICD-10-CM | POA: Diagnosis not present

## 2020-11-10 DIAGNOSIS — I38 Endocarditis, valve unspecified: Secondary | ICD-10-CM | POA: Insufficient documentation

## 2020-11-10 DIAGNOSIS — N183 Chronic kidney disease, stage 3 unspecified: Secondary | ICD-10-CM | POA: Diagnosis not present

## 2020-11-10 DIAGNOSIS — I77811 Abdominal aortic ectasia: Secondary | ICD-10-CM | POA: Diagnosis not present

## 2020-11-17 ENCOUNTER — Encounter: Payer: Self-pay | Admitting: Family Medicine

## 2020-11-17 ENCOUNTER — Ambulatory Visit (INDEPENDENT_AMBULATORY_CARE_PROVIDER_SITE_OTHER): Payer: Medicare HMO | Admitting: Family Medicine

## 2020-11-17 ENCOUNTER — Other Ambulatory Visit: Payer: Self-pay

## 2020-11-17 VITALS — BP 132/84 | HR 96 | Temp 98.2°F | Resp 18 | Ht 76.0 in | Wt 220.1 lb

## 2020-11-17 DIAGNOSIS — E782 Mixed hyperlipidemia: Secondary | ICD-10-CM

## 2020-11-17 DIAGNOSIS — F339 Major depressive disorder, recurrent, unspecified: Secondary | ICD-10-CM

## 2020-11-17 DIAGNOSIS — E890 Postprocedural hypothyroidism: Secondary | ICD-10-CM | POA: Diagnosis not present

## 2020-11-17 DIAGNOSIS — N2581 Secondary hyperparathyroidism of renal origin: Secondary | ICD-10-CM

## 2020-11-17 DIAGNOSIS — R739 Hyperglycemia, unspecified: Secondary | ICD-10-CM | POA: Diagnosis not present

## 2020-11-17 DIAGNOSIS — D696 Thrombocytopenia, unspecified: Secondary | ICD-10-CM

## 2020-11-17 DIAGNOSIS — I482 Chronic atrial fibrillation, unspecified: Secondary | ICD-10-CM

## 2020-11-17 DIAGNOSIS — F988 Other specified behavioral and emotional disorders with onset usually occurring in childhood and adolescence: Secondary | ICD-10-CM

## 2020-11-17 DIAGNOSIS — I10 Essential (primary) hypertension: Secondary | ICD-10-CM | POA: Diagnosis not present

## 2020-11-17 DIAGNOSIS — N184 Chronic kidney disease, stage 4 (severe): Secondary | ICD-10-CM

## 2020-11-17 DIAGNOSIS — I77811 Abdominal aortic ectasia: Secondary | ICD-10-CM

## 2020-11-17 DIAGNOSIS — G8194 Hemiplegia, unspecified affecting left nondominant side: Secondary | ICD-10-CM

## 2020-11-17 DIAGNOSIS — R69 Illness, unspecified: Secondary | ICD-10-CM | POA: Diagnosis not present

## 2020-11-17 NOTE — Progress Notes (Signed)
Name: Duane [name]   MRN: [id number]    DOB: Jul 11, 1951   Date:11/17/2020       Progress Note  Chief Complaint  Patient presents with  . Hypothyroidism  . ADHD  . Hypertension  . Depression     Subjective:   Duane [name] is a 70 y.o. male, presents to clinic for med refill  Secondary to stroke patient uses methylphenidate for deficit in attention No tachycardia, chest pain, mood changes with meds, due for refill, controlled substance database reviewed  Hypothyroidism: Current Medication Regimen: 88 mcg synthroid Current Symptoms: denies fatigue, weight changes, heat/cold intolerance, bowel/skin changes or CVS symptoms Most recent results are below; we will be repeating labs today. Lab Results  Component Value Date   TSH 1.82 11/17/2020   HLD -has been poorly controlled in the past 1 to 2 years patient takes pravastatin 40 mg  Hypertension on losartan 25 mg daily, blood pressure near goal today 132/84   Current Outpatient Medications:  .  allopurinol (ZYLOPRIM) 300 MG tablet, Take 300 mg by mouth daily., Disp: , Rfl:  .  apixaban (ELIQUIS) 5 MG TABS tablet, Take 1 tablet (5 mg total) by mouth 2 (two) times daily., Disp: 180 tablet, Rfl: 3 .  Ascorbic Acid (VITAMIN C) 1000 MG tablet, Take 1 tablet by mouth daily., Disp: , Rfl:  .  calcitRIOL (ROCALTROL) 0.25 MCG capsule, Take 0.25 mcg daily by mouth. , Disp: , Rfl:  .  Cholecalciferol (VITAMIN D3) 2000 units capsule, Take 2,000 Units by mouth daily., Disp: , Rfl:  .  cyanocobalamin 500 MCG tablet, Take 500 mcg by mouth daily., Disp: , Rfl:  .  diltiazem (CARDIZEM CD) 120 MG 24 hr capsule, Take 1 capsule (120 mg total) by mouth daily., Disp: 30 capsule, Rfl: 0 .  doxazosin (CARDURA) 1 MG tablet, Take 1 tablet by mouth once daily, Disp: 90 tablet, Rfl: 3 .  famotidine (PEPCID) 20 MG tablet, Take 1 tablet by mouth twice daily, Disp: 180 tablet, Rfl: 0 .  ferrous sulfate 325 (65 FE) MG tablet, Take 325 mg by  mouth daily with breakfast. , Disp: , Rfl:  .  FLUoxetine (PROZAC) 20 MG tablet, Take 1 tablet by mouth once daily, Disp: 90 tablet, Rfl: 3 .  levothyroxine (SYNTHROID) 88 MCG tablet, Take 1 tablet (88 mcg total) by mouth daily., Disp: 90 tablet, Rfl: 3 .  losartan (COZAAR) 25 MG tablet, Take 1 tablet (25 mg total) by mouth daily., Disp: 90 tablet, Rfl: 1 .  methylphenidate (RITALIN) 5 MG tablet, Take 5 mg po qam and may take 5mg  po prn in afternoon, Disp: 60 tablet, Rfl: 0 .  mirtazapine (REMERON) 15 MG tablet, Take 1 tablet (15 mg total) by mouth at bedtime., Disp: 90 tablet, Rfl: 3 .  pravastatin (PRAVACHOL) 40 MG tablet, Take 1 tablet by mouth once daily, Disp: 90 tablet, Rfl: 3 .  methylphenidate (RITALIN) 5 MG tablet, Take 5 mg po every morning and may take 5 mg po as needed in afternoon, Disp: 60 tablet, Rfl: 0  Patient Active Problem List   Diagnosis Date Noted  . Hemochromatosis, hereditary ([place]) 07/21/2020  . AKI (acute kidney injury) ([place]) 02/18/2020  . Hypomagnesemia   . COVID-19 virus infection   . Atrial fibrillation with RVR ([place]) 01/22/2020  . Episode of recurrent major depressive disorder ([place]) 06/11/2019  . Aortic ectasia, abdominal ([place]) 05/22/2018  . Hyperparathyroidism, secondary renal ([place]) 10/02/2017  . Bursitis of shoulder 06/23/2017  . Controlled  substance agreement signed 05/23/2017  . On stimulant medication 02/22/2017  . Chronic low back pain without sciatica 08/21/2016  . Low libido 05/23/2016  . Colon polyp 11/27/2015  . Vitamin D deficiency 09/04/2015  . 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 Anemia, chronic renal failure 06/09/2015  . Chronic kidney disease, stage IV (severe) ([place])   . Hypertension   . Chronic gout due to renal impairment   . Hyperlipidemia   . Hypothyroidism following radioiodine therapy   . Restrictive lung disease   . Chronic atrial fibrillation ([place])   . History of gout 02/28/2014  . Dysphagia following cerebral infarction  02/21/2014  . History of arterial ischemic stroke 02/21/2014  . Left hemiparesis ([place]) 02/11/2014    Past Surgical History:  Procedure Laterality Date  . CATARACT EXTRACTION W/PHACO Right 08/27/2016   Procedure: CATARACT EXTRACTION PHACO AND INTRAOCULAR LENS PLACEMENT (IOC);  Surgeon: Eulogio Bear, MD;  Location: [facility];  Service: Ophthalmology;  Laterality: Right;  RIGHT  . ENTEROSTOMY CLOSURE  11/27/15   ileostomy takedown  . EXPLORATORY LAPAROTOMY  11/27/15  . illeostomy  07/2014  . POLYPECTOMY  10/15   GI surgery to remove polyp    Family History  Problem Relation Age of Onset  . Arthritis Mother   . Heart disease Mother   . Heart attack Father   . Heart disease Father   . Cirrhosis Sister   . Hyperparathyroidism Neg Hx     Social History   Tobacco Use  . Smoking status: Never Smoker  . Smokeless tobacco: Never Used  . Tobacco comment: smoking cessation materials not required  Vaping Use  . Vaping Use: Never used  Substance Use Topics  . Alcohol use: No  . Drug use: No     Allergies  Allergen Reactions  . Bactrim [Sulfamethoxazole-Trimethoprim] Other (See Comments)    Hyperkalemia  . Chlorpromazine Other (See Comments)    Hiccups, hallucinations  . Cyclobenzaprine Other (See Comments)    Sleeps for 8 hrs after taking it  . Oxycodone Other (See Comments)    Excessive sleepiness    Health Maintenance  Topic Date Due  . COVID-19 Vaccine (3 - Moderna risk 4-dose series) 12/11/2020 (Originally 06/23/2020)  . COLONOSCOPY (Pts 45-80yrs Insurance coverage will need to be confirmed)  07/07/2024  . TETANUS/TDAP  01/09/2026  . INFLUENZA VACCINE  Completed  . Hepatitis C Screening  Completed  . PNA vac Low Risk Adult  Completed    Chart Review Today: I personally reviewed active problem list, medication list, allergies, family history, social history, health maintenance, notes from last encounter, lab results, imaging with the patient/caregiver  today.   Review of Systems  Constitutional: Negative.   HENT: Negative.   Eyes: Negative.   Respiratory: Negative.   Cardiovascular: Negative.   Gastrointestinal: Negative.   Endocrine: Negative.   Genitourinary: Negative.   Musculoskeletal: Negative.   Skin: Negative.   Allergic/Immunologic: Negative.   Neurological: Negative.   Hematological: Negative.   Psychiatric/Behavioral: Negative.   All other systems reviewed and are negative.    Objective:   Vitals:   11/17/20 1112  BP: 132/84  Pulse: 96  Resp: 18  Temp: 98.2 F (36.8 C)  TempSrc: Oral  SpO2: 97%  Weight: 220 lb 1.6 oz (99.8 kg)  Height: 6\' 4"  (1.93 m)    Body mass index is 26.79 kg/m.  Physical Exam Vitals and nursing note reviewed.  Constitutional:      General: He is not in  acute distress.    Appearance: He is not toxic-appearing or diaphoretic.     Comments: Chronically ill appearing male, at his baseline  HENT:     Head: Normocephalic and atraumatic.  Eyes:     General: No scleral icterus.       Right eye: No discharge.        Left eye: No discharge.     Conjunctiva/sclera: Conjunctivae normal.  Cardiovascular:     Rate and Rhythm: Normal rate. Rhythm irregularly irregular.     Pulses: Normal pulses.          Radial pulses are 2+ on the right side and 2+ on the left side.     Heart sounds: Normal heart sounds. No murmur heard. No friction rub. No gallop.   Pulmonary:     Effort: Pulmonary effort is normal. No respiratory distress.     Breath sounds: Normal breath sounds. No stridor. No wheezing, rhonchi or rales.  Abdominal:     General: Bowel sounds are normal. There is no distension.     Palpations: Abdomen is soft.  Musculoskeletal:     Right lower leg: No edema.     Left lower leg: No edema.  Skin:    General: Skin is warm and dry.     Capillary Refill: Capillary refill takes less than 2 seconds.  Neurological:     Mental Status: He is alert. Mental status is at baseline.   Psychiatric:        Mood and Affect: Mood normal. Mood is not anxious or depressed. Affect is flat. Affect is not inappropriate.        Behavior: Behavior is cooperative.     Weight increasing - reviewed today   Wt Readings from Last 5 Encounters:  11/17/20 220 lb 1.6 oz (99.8 kg)  09/21/20 222 lb 1.6 oz (100.7 kg)  06/22/20 217 lb 6.4 oz (98.6 kg)  05/19/20 220 lb 14.4 oz (100.2 kg)  02/17/20 214 lb (97.1 kg)   BMI Readings from Last 5 Encounters:  11/17/20 26.79 kg/m  09/21/20 27.03 kg/m  06/22/20 26.46 kg/m  05/19/20 26.89 kg/m  02/17/20 26.05 kg/m      Assessment & Plan:     ICD-10-CM   1. Essential hypertension  I10    Blood pressure at goal today, some of his meds are primarily managed by cardiology  2. Mixed hyperlipidemia  E78.2 Lipid panel   On pravastatin, LDL poorly controlled, due for recheck of lipids other labs were reviewed today  3. Chronic kidney disease, stage IV (severe) ([place])  N18.4    Per nephrology, labs and recent office visit reviewed per care everywhere  4. Hypothyroidism following radioiodine therapy  E89.0 TSH   No concerns with current dose, due for recheck of labs  5. Episode of recurrent major depressive disorder, unspecified depression episode severity ([place])  F33.9    Mood stable, per psychiatry  6. Hyperparathyroidism, secondary renal ([place])  N25.81    Per nephrology  7. Attention deficit disorder (ADD) without hyperactivity  F98.8 methylphenidate (RITALIN) 5 MG tablet    methylphenidate (RITALIN) 5 MG tablet   Meds refilled today, controlled substance database reviewed, no side effects or concerns  8. Hyperglycemia  R73.9 Hemoglobin A1c   Check A1c  9. Thrombocytopenia ([place])  D69.6    Reviewed his labs through care everywhere  10. Left hemiparesis ([facility]) Chronic G81.94 Lipid panel   Per neurology s/p stroke, no change to his baseline, on statin  11. Aortic  ectasia, abdominal ([place]) Chronic I77.811 Lipid panel   sees vascular  specialist  12. Chronic atrial fibrillation ([facility]) Chronic I48.20    Rate controlled, per cardiology  13. Attention deficit disorder (ADD) without hyperactivity  F98.8 methylphenidate (RITALIN) 5 MG tablet    methylphenidate (RITALIN) 5 MG tablet   Refill of methylphenidate, no concerning side effects when taking medications, very low dose, consistent prescribing, controlled substance database reviewed     6 month f/up   Delsa Grana, PA-C 11/17/20 11:51 AM

## 2020-11-18 LAB — LIPID PANEL
Cholesterol: 194 mg/dL (ref <200)
HDL: 40 mg/dL (ref >=40)
LDL Cholesterol (Calc): 129 mg/dL (calc) — ABNORMAL HIGH
Non-HDL Cholesterol (Calc): 154 mg/dL (calc) — ABNORMAL HIGH (ref <130)
Total CHOL/HDL Ratio: 4.9 (calc) (ref <5.0)
Triglycerides: 141 mg/dL (ref <150)

## 2020-11-18 LAB — TSH: TSH: 1.82 mIU/L (ref 0.40–4.50)

## 2020-11-18 LAB — HEMOGLOBIN A1C
Hgb A1c MFr Bld: 5.6 % of total Hgb (ref <5.7)
Mean Plasma Glucose: 114 mg/dL
eAG (mmol/L): 6.3 mmol/L

## 2020-12-27 ENCOUNTER — Encounter: Payer: Self-pay | Admitting: Family Medicine

## 2020-12-27 DIAGNOSIS — D696 Thrombocytopenia, unspecified: Secondary | ICD-10-CM | POA: Insufficient documentation

## 2020-12-27 MED ORDER — METHYLPHENIDATE HCL 5 MG PO TABS: ORAL_TABLET | ORAL | 0 refills | Status: DC

## 2021-01-11 DIAGNOSIS — M109 Gout, unspecified: Secondary | ICD-10-CM | POA: Diagnosis not present

## 2021-01-11 DIAGNOSIS — N184 Chronic kidney disease, stage 4 (severe): Secondary | ICD-10-CM | POA: Diagnosis not present

## 2021-01-11 DIAGNOSIS — I10 Essential (primary) hypertension: Secondary | ICD-10-CM | POA: Diagnosis not present

## 2021-01-11 DIAGNOSIS — N2581 Secondary hyperparathyroidism of renal origin: Secondary | ICD-10-CM | POA: Diagnosis not present

## 2021-02-21 ENCOUNTER — Other Ambulatory Visit: Payer: Self-pay | Admitting: Family Medicine

## 2021-02-21 DIAGNOSIS — I10 Essential (primary) hypertension: Secondary | ICD-10-CM

## 2021-03-19 ENCOUNTER — Telehealth: Payer: Self-pay | Admitting: Family Medicine

## 2021-03-19 DIAGNOSIS — F0631 Mood disorder due to known physiological condition with depressive features: Secondary | ICD-10-CM

## 2021-03-19 MED ORDER — FLUOXETINE HCL 20 MG PO TABS: 20.0000 mg | ORAL_TABLET | Freq: Every day | ORAL | 1 refills | Status: DC

## 2021-03-19 NOTE — Telephone Encounter (Signed)
Pt has an appt on 05/17/21

## 2021-03-19 NOTE — Telephone Encounter (Signed)
Medication Refill - Medication: FLUoxetine (PROZAC) 20 MG tablet   Has the patient contacted their pharmacy? No, pt states he had not been on it in a while, and wanted to get it refilled  Preferred Pharmacy (with phone number or street name): [place], [place] - [place]  Phone: [phone] Fax: [phone]  Agent: Please be advised that RX refills may take up to 3 business days. We ask that you follow-up with your pharmacy.

## 2021-03-29 ENCOUNTER — Other Ambulatory Visit: Payer: Self-pay | Admitting: Family Medicine

## 2021-04-11 ENCOUNTER — Other Ambulatory Visit: Payer: Self-pay | Admitting: Family Medicine

## 2021-04-11 DIAGNOSIS — E782 Mixed hyperlipidemia: Secondary | ICD-10-CM

## 2021-05-07 DIAGNOSIS — N184 Chronic kidney disease, stage 4 (severe): Secondary | ICD-10-CM | POA: Diagnosis not present

## 2021-05-07 DIAGNOSIS — N2581 Secondary hyperparathyroidism of renal origin: Secondary | ICD-10-CM | POA: Diagnosis not present

## 2021-05-07 DIAGNOSIS — I10 Essential (primary) hypertension: Secondary | ICD-10-CM | POA: Diagnosis not present

## 2021-05-07 DIAGNOSIS — M109 Gout, unspecified: Secondary | ICD-10-CM | POA: Diagnosis not present

## 2021-05-17 ENCOUNTER — Other Ambulatory Visit: Payer: Self-pay

## 2021-05-17 ENCOUNTER — Encounter: Payer: Self-pay | Admitting: Family Medicine

## 2021-05-17 ENCOUNTER — Ambulatory Visit (INDEPENDENT_AMBULATORY_CARE_PROVIDER_SITE_OTHER): Payer: Medicare HMO | Admitting: Family Medicine

## 2021-05-17 VITALS — BP 116/72 | HR 97 | Temp 98.1°F | Resp 16 | Ht 76.0 in | Wt 213.0 lb

## 2021-05-17 DIAGNOSIS — M1A30X0 Chronic gout due to renal impairment, unspecified site, without tophus (tophi): Secondary | ICD-10-CM | POA: Diagnosis not present

## 2021-05-17 DIAGNOSIS — K219 Gastro-esophageal reflux disease without esophagitis: Secondary | ICD-10-CM

## 2021-05-17 DIAGNOSIS — I10 Essential (primary) hypertension: Secondary | ICD-10-CM | POA: Diagnosis not present

## 2021-05-17 DIAGNOSIS — E782 Mixed hyperlipidemia: Secondary | ICD-10-CM | POA: Diagnosis not present

## 2021-05-17 DIAGNOSIS — I69398 Other sequelae of cerebral infarction: Secondary | ICD-10-CM

## 2021-05-17 DIAGNOSIS — E890 Postprocedural hypothyroidism: Secondary | ICD-10-CM

## 2021-05-17 DIAGNOSIS — E559 Vitamin D deficiency, unspecified: Secondary | ICD-10-CM

## 2021-05-17 DIAGNOSIS — N2581 Secondary hyperparathyroidism of renal origin: Secondary | ICD-10-CM | POA: Diagnosis not present

## 2021-05-17 DIAGNOSIS — Z5181 Encounter for therapeutic drug level monitoring: Secondary | ICD-10-CM

## 2021-05-17 DIAGNOSIS — G4700 Insomnia, unspecified: Secondary | ICD-10-CM

## 2021-05-17 DIAGNOSIS — L989 Disorder of the skin and subcutaneous tissue, unspecified: Secondary | ICD-10-CM

## 2021-05-17 DIAGNOSIS — F988 Other specified behavioral and emotional disorders with onset usually occurring in childhood and adolescence: Secondary | ICD-10-CM | POA: Diagnosis not present

## 2021-05-17 DIAGNOSIS — Z79899 Other long term (current) drug therapy: Secondary | ICD-10-CM

## 2021-05-17 DIAGNOSIS — D631 Anemia in chronic kidney disease: Secondary | ICD-10-CM

## 2021-05-17 DIAGNOSIS — F339 Major depressive disorder, recurrent, unspecified: Secondary | ICD-10-CM

## 2021-05-17 DIAGNOSIS — N184 Chronic kidney disease, stage 4 (severe): Secondary | ICD-10-CM

## 2021-05-17 DIAGNOSIS — R69 Illness, unspecified: Secondary | ICD-10-CM | POA: Diagnosis not present

## 2021-05-17 DIAGNOSIS — F0631 Mood disorder due to known physiological condition with depressive features: Secondary | ICD-10-CM

## 2021-05-17 MED ORDER — METHYLPHENIDATE HCL 5 MG PO TABS: ORAL_TABLET | ORAL | 0 refills | Status: DC

## 2021-05-17 MED ORDER — FLUOXETINE HCL 20 MG PO TABS: 20.0000 mg | ORAL_TABLET | Freq: Every day | ORAL | 3 refills | Status: DC

## 2021-05-17 MED ORDER — MIRTAZAPINE 15 MG PO TABS: 15.0000 mg | ORAL_TABLET | Freq: Every day | ORAL | 3 refills | Status: DC

## 2021-05-17 MED ORDER — FAMOTIDINE 20 MG PO TABS: 20.0000 mg | ORAL_TABLET | Freq: Two times a day (BID) | ORAL | 3 refills | Status: DC | PRN

## 2021-05-17 MED ORDER — LEVOTHYROXINE SODIUM 88 MCG PO TABS: 88.0000 ug | ORAL_TABLET | Freq: Every day | ORAL | 3 refills | Status: DC

## 2021-05-17 NOTE — Progress Notes (Signed)
Name: Pascal Stiggers   MRN: [id number]    DOB: [birth year]   Date:05/17/2021       Progress Note  Chief Complaint  Patient presents with   Hypertension   Hypothyroidism   Depression   ADHD   Hyperlipidemia     Subjective:   Legend Tumminello is a 70 y.o. male, presents to clinic for routine f/up and med refil  CKD stage 4 - managed by nephrology - outside labs and recent OV reviewed Secondary hyperparathyroid, anemia and chronic gout On losartan and allopurinol per nephro Pt reports good med compliance and denies any SE.   HTN: Blood pressure today is well controlled. BP Readings from Last 3 Encounters:  05/17/21 116/72  11/17/20 132/84  09/21/20 124/86  Pt denies CP, SOB, exertional sx, LE edema, palpitation, Ha's, visual disturbances, lightheadedness, hypotension, syncope.  Hx of stroke residual attention deficit, MDD, left hemiparesis - pt states no dysphagia issues On methylphenidate and SSRI-prozac 20 mg Overall mood stable - no changes or concerns Depression screen [facility] 2/9 05/17/2021 11/17/2020 09/21/2020  Decreased Interest 0 0 0  Down, Depressed, Hopeless 0 0 0  PHQ - 2 Score 0 0 0  Altered sleeping 0 0 0  Tired, decreased energy 0 0 0  Change in appetite 0 0 0  Feeling bad or failure about yourself  0 0 0  Trouble concentrating 0 0 0  Moving slowly or fidgety/restless 0 0 0  Suicidal thoughts 0 0 0  PHQ-9 Score 0 0 0  Difficult doing work/chores Not difficult at all Not difficult at all Not difficult at all  Some recent data might be hidden  Phq neg and reviewed today Cholesterol not well controlled with pravastatin Lab Results  Component Value Date   CHOL 194 11/17/2020   HDL 40 11/17/2020   LDLCALC 129 (H) 11/17/2020   TRIG 141 11/17/2020   CHOLHDL 4.9 11/17/2020  States cannot take lipitor or other statins due to past liver damage/LFT elevation - did a whole work up with specialist and we advised to not take lipitor - on 40 mg pravastatin -  not working on diet efforts very much, but he doesn't eat very much either  Afib on eliquis, cardizam - no CP, palpitations, hx of afib with RVR, no recent episodes of feeling change in palpitations or new syncope/hypotension  Hypothyroidism: Current Medication Regimen: 88 mcg synthroid Takes medicine corrently Current Symptoms: denies fatigue, weight changes, heat/cold intolerance, bowel/skin changes or CVS symptoms Most recent results are below; we will not be repeating labs today - per pt preference Lab Results  Component Value Date   TSH 1.82 11/17/2020   GERD on pepcid BID daily, controls sx, no abd pain, coughing, diarrhea, blood in stool/melena  Labs reviewed per Care everywhere: 05/07/2021 H/H  13.3/40.2 Phos 3.3 PTH 59 Glucose 101 BUN/sCr 28/2.60 - increased from last labs 3/31 27/2.29 GFR 26, previously 28  Last vitamin D Lab Results  Component Value Date   VD25OH 58 08/02/2019  Per nephro - 10/02/2017 41.5  Last nephro OV a week ago with Dr. Holley Raring: Anthonette Legato, MD - 05/07/2021 12:00 PM EDT   Follow Up Visit   Patient Name: Nitin Mckowen, male  Patient DOB: May 10, 1951 Date of Service: 05/07/2021  Patient MRN: [id number] Provider Creating Note: Anthonette Legato, MD  [phone] Primary Care Physician:  [id number] [address] Additional Physicians/ Providers:   History of Present Illness Reznor Ferrando is a 70 y.o. male who  is following up today for chronic kidney disease stage IIIb, hypertension, secondary hyperparathyroidism, anemia of chronic kidney disease, and gout. The patient's chronic kidney disease appears to be stable most recent EGFR 28 with an albumin to creatinine ratio of 8. In regards to hypertension blood pressure well controlled at 118/80. Patient also has secondary hyperparathyroidism with most recent PTH of 44, phosphorus 2.9, calcium 9.0. He remains adherent with calcitriol. He also has gout with most recent serum uric acid level of  4.9.  Skin lesion and poor healing scabbing for at least 5 years to the left of central abd scar - has been there since abd surgery - no pain   Current Outpatient Medications:    allopurinol (ZYLOPRIM) 300 MG tablet, Take 300 mg by mouth daily., Disp: , Rfl:    apixaban (ELIQUIS) 5 MG TABS tablet, Take 1 tablet by mouth every 12 (twelve) hours., Disp: , Rfl:    Ascorbic Acid (VITAMIN C) 1000 MG tablet, Take 1 tablet by mouth daily., Disp: , Rfl:    calcitRIOL (ROCALTROL) 0.25 MCG capsule, Take 0.25 mcg daily by mouth. , Disp: , Rfl:    Cholecalciferol (VITAMIN D3) 2000 units capsule, Take 2,000 Units by mouth daily., Disp: , Rfl:    cyanocobalamin 500 MCG tablet, Take 500 mcg by mouth daily., Disp: , Rfl:    diltiazem (TIAZAC) 120 MG 24 hr capsule, Take 1 capsule by mouth daily., Disp: , Rfl:    doxazosin (CARDURA) 1 MG tablet, Take 1 tablet by mouth once daily, Disp: 90 tablet, Rfl: 3   famotidine (PEPCID) 20 MG tablet, Take 1 tablet by mouth twice daily, Disp: 180 tablet, Rfl: 0   ferrous sulfate 325 (65 FE) MG tablet, Take 325 mg by mouth daily with breakfast. , Disp: , Rfl:    FLUoxetine (PROZAC) 20 MG tablet, Take 1 tablet (20 mg total) by mouth daily., Disp: 30 tablet, Rfl: 1   levothyroxine (SYNTHROID) 88 MCG tablet, Take 1 tablet (88 mcg total) by mouth daily., Disp: 90 tablet, Rfl: 3   losartan (COZAAR) 25 MG tablet, Take 1 tablet by mouth daily., Disp: , Rfl:    methylphenidate (RITALIN) 5 MG tablet, Take 5 mg po every morning and may take 5 mg po as needed in afternoon, Disp: 60 tablet, Rfl: 0   methylphenidate (RITALIN) 5 MG tablet, Take 5 mg po qam and may take 102m po prn in afternoon, Disp: 60 tablet, Rfl: 0   methylphenidate (RITALIN) 5 MG tablet, Take 5 mg po every morning and may take 5 mg po as needed in afternoon, Disp: 60 tablet, Rfl: 0   mirtazapine (REMERON) 15 MG tablet, Take 1 tablet (15 mg total) by mouth at bedtime., Disp: 90 tablet, Rfl: 3   pravastatin (PRAVACHOL)  40 MG tablet, Take 1 tablet by mouth once daily, Disp: 90 tablet, Rfl: 0  Patient Active Problem List   Diagnosis Date Noted   Thrombocytopenia ([facility] 12/27/2020   Hypomagnesemia 11/10/2020   Valvular heart disease 11/10/2020   Hemochromatosis, hereditary ([facility] 07/21/2020   Episode of recurrent major depressive disorder ([place] 06/11/2019   Aortic ectasia, abdominal ([place] 05/22/2018   Hyperparathyroidism, secondary renal ([place] 10/02/2017   Controlled substance agreement signed 05/23/2017   On stimulant medication 02/22/2017   Colon polyp 11/27/2015   Vitamin D deficiency 09/04/2015   Anemia, chronic renal failure 06/09/2015   Chronic kidney disease, stage IV (severe) (HCC)    Hypertension    Chronic gout due to renal impairment  Hyperlipidemia    Hypothyroidism following radioiodine therapy    Restrictive lung disease    Chronic atrial fibrillation ([place])    Dysphagia following cerebral infarction 02/21/2014   History of arterial ischemic stroke 02/21/2014   Left hemiparesis ([place]) 02/11/2014    Past Surgical History:  Procedure Laterality Date   CATARACT EXTRACTION W/PHACO Right 08/27/2016   Procedure: CATARACT EXTRACTION PHACO AND INTRAOCULAR LENS PLACEMENT ([place]);  Surgeon: Eulogio Bear, MD;  Location: [place];  Service: Ophthalmology;  Laterality: Right;  RIGHT   ENTEROSTOMY CLOSURE  11/27/15   ileostomy takedown   EXPLORATORY LAPAROTOMY  11/27/15   illeostomy  07/2014   POLYPECTOMY  10/15   GI surgery to remove polyp    Family History  Problem Relation Age of Onset   Arthritis Mother    Heart disease Mother    Heart attack Father    Heart disease Father    Cirrhosis Sister    Hyperparathyroidism Neg Hx     Social History   Tobacco Use   Smoking status: Never   Smokeless tobacco: Never   Tobacco comments:    smoking cessation materials not required  Vaping Use   Vaping Use: Never used  Substance Use Topics   Alcohol use: No   Drug use: No      Allergies  Allergen Reactions   Bactrim [Sulfamethoxazole-Trimethoprim] Other (See Comments)    Hyperkalemia   Chlorpromazine Other (See Comments)    Hiccups, hallucinations   Cyclobenzaprine Other (See Comments)    Sleeps for 8 hrs after taking it   Oxycodone Other (See Comments)    Excessive sleepiness    Health Maintenance  Topic Date Due   Zoster Vaccines- Shingrix (1 of 2) Never done   INFLUENZA VACCINE  05/14/2021   COVID-19 Vaccine (3 - Moderna risk series) 06/02/2021 (Originally 06/23/2020)   COLONOSCOPY (Pts 45-17yr Insurance coverage will need to be confirmed)  07/07/2024   TETANUS/TDAP  01/09/2026   Hepatitis C Screening  Completed   PNA vac Low Risk Adult  Completed   HPV VACCINES  Aged Out    Chart Review Today: I personally reviewed active problem list, medication list, allergies, family history, social history, health maintenance, notes from last encounter, lab results, imaging with the patient/caregiver today. Spent more than 10 min for chart prep, chart and lab review through care everywhere of recent labs and specialists OV  Review of Systems  Constitutional: Negative.   HENT: Negative.    Eyes: Negative.   Respiratory: Negative.    Cardiovascular: Negative.   Gastrointestinal: Negative.   Endocrine: Negative.   Genitourinary: Negative.   Musculoskeletal: Negative.   Skin: Negative.   Allergic/Immunologic: Negative.   Neurological: Negative.   Hematological: Negative.   Psychiatric/Behavioral: Negative.    All other systems reviewed and are negative.   Objective:   Vitals:   05/17/21 1010  BP: 116/72  Pulse: 97  Resp: 16  Temp: 98.1 F (36.7 C)  SpO2: 96%  Weight: 213 lb (96.6 kg)  Height: '6\' 4"'  (1.93 m)    Body mass index is 25.93 kg/m.  Physical Exam      Assessment & Plan:   70y.o. 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 Hypothyroidism  following radioiodine therapy  E89.0 levothyroxine (SYNTHROID) 88 MCG tablet   no concerning sx, labs done 6 months ago, pt does not want to repeat labs, continue 88 mcg daily    2. Essential hypertension  I10    on losartan 25 mg, cardizem for afib, blood pressure stable, well controlled, BP at goal today    3. Attention deficit disorder (ADD) without hyperactivity  F98.8 methylphenidate (RITALIN) 5 MG tablet    methylphenidate (RITALIN) 5 MG tablet    methylphenidate (RITALIN) 5 MG tablet   Refill of methylphenidate, no concerning side effects when taking medications, very low dose, consistent prescribing, controlled substance database reviewed     4. Depression due to old stroke  I69.398 FLUoxetine (PROZAC) 20 MG tablet   F06.31    phq reviewed today, stable, no change continue prozac 20 mg daily    5. Insomnia, unspecified type  G47.00 mirtazapine (REMERON) 15 MG tablet   Well-controlled with Remeron -refills ordered    6. Hyperparathyroidism, secondary renal ([place])  N25.81    per nephrology on allopurinol and calcitriol, labs at goal    7. Chronic kidney disease, stage IV (severe) (HCC)  N18.4 VITAMIN D 25 Hydroxy (Vit-D Deficiency, Fractures)   per nephrology - lateef, last OV and labs reviewed, stable    8. Hemochromatosis, hereditary ([place])  E83.110    need LFT's, CBC reviewed and normal    9. Vitamin D deficiency  E55.9 VITAMIN D 25 Hydroxy (Vit-D Deficiency, Fractures)   labs last done 2 years ago and were normal, on supplement    10. Anemia of chronic renal failure, stage 4 (severe) (HCC)  N18.4    D63.1    see above, renal function and anemia stable    11. Chronic gout due to renal impairment without tophus, unspecified site  [id number].[id number]    uric acid at goal     12. Mixed hyperlipidemia  E78.2 Hepatic function panel    Lipid panel   not well controlled, pt will not try other statin med due to prior change in LFT's - did not tolerate higher lipitor doses    13. On stimulant medication  Z79.899 methylphenidate (RITALIN) 5 MG tablet    methylphenidate (RITALIN) 5 MG tablet    methylphenidate (RITALIN) 5 MG tablet   see above, low dosing, working well, no concerns with use or refills, no SE or concerns related to affect on HR or mood    14. Controlled substance agreement signed  Z79.899 methylphenidate (RITALIN) 5 MG tablet    methylphenidate (RITALIN) 5 MG tablet    methylphenidate (RITALIN) 5 MG tablet    15. Episode of recurrent major depressive disorder, unspecified depression episode severity (HCC)  F33.9 FLUoxetine (PROZAC) 20 MG tablet   see above    16. Gastroesophageal reflux disease, unspecified whether esophagitis present  K21.9 famotidine (PEPCID) 20 MG tablet   stable and sx well controlled with pepcid bid    17. Encounter for medication monitoring  Z51.81 VITAMIN D 25 Hydroxy (Vit-D Deficiency, Fractures)    Hepatic function panel    Lipid panel    18. Skin lesion  L98.9    on abd, left of midline abd scar, dry, scabbed raised oval, pink in color, slightly firm, no edema, erythema, tenderness - recommended bx (see below)     Lesion - pt states it has gradually improved over 5 years, to me appears abnormal and with years of scabbing and not healing I recommend either we send for biopsy  or they consult specialists for eval and bx - pt's wife states dermatology has previously looked at it and they weren't concerned (years ago).   Recommended vaseline or hydrocolloid dressing/patches to see if it helps with scabbing and dryness - still recommend a shave bx.  No follow-ups on file.   Delsa Grana, PA-C 05/17/21 10:29 AM

## 2021-05-17 NOTE — Patient Instructions (Signed)
Past eval and recommendations from GI specialists in 2020 -  Duane [name] is a 70 y.o. y/o male 0 here to follow up after over an year for abnormal LFT's . Reviewing labs suggests that he has had abnormal alkaline phosphatase for over 3 years. PTH has been elevated previously but presently normal  .  His transaminases have also been elevated recently and hepatic steatosis seen on USG.  Hepatic steatosis ie fatty liver can cause a similar picture. A MRCP request in the past has been denied by his insurance. I do note his amiodarone has been stopped a few months back and we can monitor his LFT's to see if the LFT's are trending down .     Plan 1. LFT's elevated with GGT suggesting liver etiology- Alk phos and transaminases elevated. Get MRCP if rechecked LFT's today still not trending down ( amiodarone stopped a few months back)   2. Can start on statin for lipids after I recheck LFT's and ensure < 3 x upper limits of normal and I will be happy to monitor it . Suggest starting pravastatin - low dose as large studies have shown that it has no effect on LFT's in comparison to placebo( studied over 112,000 patient years of exposure)   Dr Jonathon Bellows  MD,MRCP [facility]) Follow up in 3 months     It was determined that liver problems came from amiodorone and fatty liver disease - so I see no contratindication from going back to more effective statin medications as long as they are affordable and cause no side effects - we would follow that closely with you and monitor the cholesterol and liver function tests    Fat and Cholesterol Restricted Eating Plan Getting too much fat and cholesterol in your diet may cause health problems. Choosing the right foods helps keep your fat and cholesterol at normal levels.This can keep you from getting certain diseases. What are tips for following this plan? Meal planning At meals, divide your plate into four equal parts: Fill one-half of your plate with vegetables  and green salads. Fill one-fourth of your plate with whole grains. Fill one-fourth of your plate with low-fat (lean) protein foods. Eat fish that is high in omega-3 fats at least two times a week. This includes mackerel, tuna, sardines, and salmon. Eat foods that are high in fiber, such as whole grains, beans, apples, broccoli, carrots, peas, and barley. General tips  Work with your doctor to lose weight if you need to. Avoid: Foods with added sugar. Fried foods. Foods with partially hydrogenated oils. Limit alcohol intake to no more than 1 drink a day for nonpregnant women and 2 drinks a day for men. One drink equals 12 oz of beer, 5 oz of wine, or 1 oz of hard liquor.  Reading food labels Check food labels for: Trans fats. Partially hydrogenated oils. Saturated fat (g) in each serving. Cholesterol (mg) in each serving. Fiber (g) in each serving. Choose foods with healthy fats, such as: Monounsaturated fats. Polyunsaturated fats. Omega-3 fats. Choose grain products that have whole grains. Look for the word "whole" as the first word in the ingredient list. Cooking Cook foods using low-fat methods. These include baking, boiling, grilling, and broiling. Eat more home-cooked foods. Eat at restaurants and buffets less often. Avoid cooking using saturated fats, such as butter, cream, palm oil, palm kernel oil, and coconut oil. Recommended foods  Fruits All fresh, canned (in natural juice), or frozen fruits. Vegetables Fresh or frozen vegetables (  raw, steamed, roasted, or grilled). Green salads. Grains Whole grains, such as whole wheat or whole grain breads, crackers, cereals, and pasta. Unsweetened oatmeal, bulgur, barley, quinoa, or brown rice. Corn or whole wheat flour tortillas. Meats and other protein foods Ground beef (85% or leaner), grass-fed beef, or beef trimmed of fat. Skinless chicken or [place]. Ground chicken or [place]. Pork trimmed of fat. All fish and seafood. Egg  whites. Dried beans, peas, or lentils. Unsalted nuts or seeds. Unsalted canned beans. Nut butters without added sugar or oil. Dairy Low-fat or nonfat dairy products, such as skim or 1% milk, 2% or reduced-fat cheeses, low-fat and fat-free ricotta or cottage cheese, or plain low-fat and nonfat yogurt. Fats and oils Tub margarine without trans fats. Light or reduced-fat mayonnaise and salad dressings. Avocado. Olive, canola, sesame, or safflower oils. The items listed above may not be a complete list of foods and beverages youcan eat. Contact a dietitian for more information. Foods to avoid Fruits Canned fruit in heavy syrup. Fruit in cream or butter sauce. Fried fruit. Vegetables Vegetables cooked in cheese, cream, or butter sauce. Fried vegetables. Grains White bread. White pasta. White rice. Cornbread. Bagels, pastries, and croissants. Crackers and snack foods that contain trans fat and hydrogenated oils. Meats and other protein foods Fatty cuts of meat. Ribs, chicken wings, bacon, sausage, bologna, salami, chitterlings, fatback, hot dogs, bratwurst, and packaged lunch meats. Liver and organ meats. Whole eggs and egg yolks. Chicken and [place] with skin. Fried meat. Dairy Whole or 2% milk, cream, half-and-half, and cream cheese. Whole milk cheeses. Whole-fat or sweetened yogurt. Full-fat cheeses. Nondairy creamers and whipped toppings. Processed cheese, cheese spreads, and cheese curds. Beverages Alcohol. Sugar-sweetened drinks such as sodas, lemonade, and fruit drinks. Fats and oils Butter, stick margarine, lard, shortening, ghee, or bacon fat. Coconut, palm kernel, and palm oils. Sweets and desserts Corn syrup, sugars, honey, and molasses. Candy. Jam and jelly. Syrup. Sweetened cereals. Cookies, pies, cakes, donuts, muffins, and ice cream. The items listed above may not be a complete list of foods and beverages youshould avoid. Contact a dietitian for more information. Summary Choosing the  right foods helps keep your fat and cholesterol at normal levels. This can keep you from getting certain diseases. At meals, fill one-half of your plate with vegetables and green salads. Eat high-fiber foods, like whole grains, beans, apples, carrots, peas, and barley. Limit added sugar, saturated fats, alcohol, and fried foods. This information is not intended to replace advice given to you by your health care provider. Make sure you discuss any questions you have with your healthcare provider. Document Revised: 02/02/2020 Document Reviewed: 02/02/2020 Elsevier Patient Education  2022 [employer].

## 2021-05-18 ENCOUNTER — Other Ambulatory Visit: Payer: Self-pay | Admitting: Family Medicine

## 2021-05-18 DIAGNOSIS — H2512 Age-related nuclear cataract, left eye: Secondary | ICD-10-CM | POA: Diagnosis not present

## 2021-05-18 LAB — HEPATIC FUNCTION PANEL
AG Ratio: 1.6 (calc) (ref 1.0–2.5)
ALT: 11 U/L (ref 9–46)
AST: 16 U/L (ref 10–35)
Albumin: 4 g/dL (ref 3.6–5.1)
Alkaline phosphatase (APISO): 97 U/L (ref 35–144)
Bilirubin, Direct: 0.1 mg/dL (ref 0.0–0.2)
Globulin: 2.5 g/dL (calc) (ref 1.9–3.7)
Indirect Bilirubin: 0.5 mg/dL (calc) (ref 0.2–1.2)
Total Bilirubin: 0.6 mg/dL (ref 0.2–1.2)
Total Protein: 6.5 g/dL (ref 6.1–8.1)

## 2021-05-18 LAB — LIPID PANEL
Cholesterol: 183 mg/dL (ref <200)
HDL: 43 mg/dL (ref >=40)
LDL Cholesterol (Calc): 115 mg/dL (calc) — ABNORMAL HIGH
Non-HDL Cholesterol (Calc): 140 mg/dL (calc) — ABNORMAL HIGH (ref <130)
Total CHOL/HDL Ratio: 4.3 (calc) (ref <5.0)
Triglycerides: 133 mg/dL (ref <150)

## 2021-05-18 LAB — VITAMIN D 25 HYDROXY (VIT D DEFICIENCY, FRACTURES): Vit D, 25-Hydroxy: 61 ng/mL (ref 30–100)

## 2021-05-18 MED ORDER — ROSUVASTATIN CALCIUM 10 MG PO TABS: 10.0000 mg | ORAL_TABLET | Freq: Every day | ORAL | 3 refills | Status: DC

## 2021-06-01 DIAGNOSIS — I480 Paroxysmal atrial fibrillation: Secondary | ICD-10-CM | POA: Diagnosis not present

## 2021-06-01 DIAGNOSIS — I38 Endocarditis, valve unspecified: Secondary | ICD-10-CM | POA: Diagnosis not present

## 2021-06-01 DIAGNOSIS — E7800 Pure hypercholesterolemia, unspecified: Secondary | ICD-10-CM | POA: Diagnosis not present

## 2021-06-01 DIAGNOSIS — E039 Hypothyroidism, unspecified: Secondary | ICD-10-CM | POA: Diagnosis not present

## 2021-06-01 DIAGNOSIS — Z8673 Personal history of transient ischemic attack (TIA), and cerebral infarction without residual deficits: Secondary | ICD-10-CM | POA: Diagnosis not present

## 2021-06-01 DIAGNOSIS — E782 Mixed hyperlipidemia: Secondary | ICD-10-CM | POA: Diagnosis not present

## 2021-06-26 ENCOUNTER — Other Ambulatory Visit: Payer: Self-pay

## 2021-06-26 ENCOUNTER — Ambulatory Visit (INDEPENDENT_AMBULATORY_CARE_PROVIDER_SITE_OTHER): Payer: Medicare HMO

## 2021-06-26 VITALS — BP 122/62 | HR 81 | Temp 98.1°F | Resp 16 | Ht 76.0 in | Wt 211.6 lb

## 2021-06-26 DIAGNOSIS — Z23 Encounter for immunization: Secondary | ICD-10-CM

## 2021-06-26 DIAGNOSIS — Z0000 Encounter for general adult medical examination without abnormal findings: Secondary | ICD-10-CM | POA: Diagnosis not present

## 2021-06-26 NOTE — Progress Notes (Signed)
Subjective:   Duane [name] is a 70 y.o. male who presents for Medicare Annual/Subsequent preventive examination.  Review of Systems     Cardiac Risk Factors include: advanced age (>68men, >8 women);dyslipidemia;male gender;hypertension     Objective:    Today's Vitals   06/26/21 1057  BP: 122/62  Pulse: 81  Resp: 16  Temp: 98.1 F (36.7 C)  TempSrc: Oral  SpO2: 98%  Weight: 211 lb 9.6 oz (96 kg)  Height: 6\' 4"  (1.93 m)   Body mass index is 25.76 kg/m.  Advanced Directives 06/26/2021 06/22/2020 01/21/2020 06/17/2019 03/12/2018 05/23/2017 02/19/2017  Does Patient Have a Medical Advance Directive? No No No No No No No  Would patient like information on creating a medical advance directive? No - Patient declined No - Patient declined No - Patient declined Yes (MAU/Ambulatory/Procedural Areas - Information given) Yes (MAU/Ambulatory/Procedural Areas - Information given) - -    Current Medications (verified) Outpatient Encounter Medications as of 06/26/2021  Medication Sig   allopurinol (ZYLOPRIM) 300 MG tablet Take 300 mg by mouth daily.   apixaban (ELIQUIS) 5 MG TABS tablet Take 1 tablet by mouth every 12 (twelve) hours.   Ascorbic Acid (VITAMIN C) 1000 MG tablet Take 1 tablet by mouth daily.   calcitRIOL (ROCALTROL) 0.25 MCG capsule Take 0.25 mcg daily by mouth.    Cholecalciferol (VITAMIN D3) 2000 units capsule Take 2,000 Units by mouth daily.   cyanocobalamin 500 MCG tablet Take 500 mcg by mouth daily.   diltiazem (CARDIZEM CD) 120 MG 24 hr capsule Take 120 mg by mouth daily.   doxazosin (CARDURA) 1 MG tablet Take 1 tablet by mouth once daily   famotidine (PEPCID) 20 MG tablet Take 1 tablet (20 mg total) by mouth 2 (two) times daily as needed for heartburn or indigestion.   ferrous sulfate 325 (65 FE) MG tablet Take 325 mg by mouth daily with breakfast.    FLUoxetine (PROZAC) 20 MG tablet Take 1 tablet (20 mg total) by mouth daily.   levothyroxine (SYNTHROID) 88 MCG  tablet Take 1 tablet (88 mcg total) by mouth daily.   losartan (COZAAR) 25 MG tablet Take 1 tablet by mouth daily.   [START ON 07/17/2021] methylphenidate (RITALIN) 5 MG tablet Take 5 mg po every morning and may take 5 mg po as needed in afternoon   mirtazapine (REMERON) 15 MG tablet Take 1 tablet (15 mg total) by mouth at bedtime.   rosuvastatin (CRESTOR) 10 MG tablet Take 1 tablet (10 mg total) by mouth daily.   [DISCONTINUED] diltiazem (TIAZAC) 120 MG 24 hr capsule Take 1 capsule by mouth daily.   methylphenidate (RITALIN) 5 MG tablet Take 5 mg po qam and may take 5mg  po prn in afternoon   methylphenidate (RITALIN) 5 MG tablet Take 5 mg po every morning and may take 5 mg po as needed in afternoon   No facility-administered encounter medications on file as of 06/26/2021.    Allergies (verified) Bactrim [sulfamethoxazole-trimethoprim], Chlorpromazine, Cyclobenzaprine, and Oxycodone   History: Past Medical History:  Diagnosis Date   A-fib [facility])    requiring life-long anticouagulation   Aortic ectasia, abdominal ([place]) 05/22/2018   [place] Sept 2018   Atrial fibrillation with RVR ([place]) 01/22/2020   Cerebellar infarct (HCC)    Chronic low back pain without sciatica 08/21/2016   CKD (chronic kidney disease)    creatinine baseline 1.3-1.5 in 2015   Colon polyp 11/27/2015   GERD (gastroesophageal reflux disease)    Gout  Graves disease    H/O hyperkalemia    with Bactrim   Hyperlipidemia    requiring life long statin therapy   Hyperparathyroidism, secondary renal ([place]) 10/02/2017   Hypertension    Hypomagnesemia    Hypothyroidism    Left hemiparesis [facility]) May 2015   s/p strke   Poor short term memory    Renal insufficiency    Restrictive lung disease    mild   Stroke ([place]) 02/20/14   left side hemiparesis, dysphagia   UTI (urinary tract infection) due to Enterococcus May 2015   Vitamin D deficiency disease    Past Surgical History:  Procedure Laterality Date   CATARACT EXTRACTION  W/PHACO Right 08/27/2016   Procedure: CATARACT EXTRACTION PHACO AND INTRAOCULAR LENS PLACEMENT ([place]);  Surgeon: Eulogio Bear, MD;  Location: [place];  Service: Ophthalmology;  Laterality: Right;  RIGHT   ENTEROSTOMY CLOSURE  11/27/15   ileostomy takedown   EXPLORATORY LAPAROTOMY  11/27/15   illeostomy  07/2014   POLYPECTOMY  10/15   GI surgery to remove polyp   Family History  Problem Relation Age of Onset   Arthritis Mother    Heart disease Mother    Heart attack Father    Heart disease Father    Cirrhosis Sister    Hyperparathyroidism Neg Hx    Social History   Socioeconomic History   Marital status: Married    Spouse name: Curt Bears   Number of children: 2   Years of education: Not on file   Highest education level: 12th grade  Occupational History   Occupation: Retired  Tobacco Use   Smoking status: Never   Smokeless tobacco: Never   Tobacco comments:    smoking cessation materials not required  Vaping Use   Vaping Use: Never used  Substance and Sexual Activity   Alcohol use: No   Drug use: No   Sexual activity: Not Currently  Other Topics Concern   Not on file  Social History Narrative   Not on file   Social Determinants of Health   Financial Resource Strain: Low Risk    Difficulty of Paying Living Expenses: Not hard at all  Food Insecurity: No Food Insecurity   Worried About Charity fundraiser in the Last Year: Never true   [place] in the Last Year: Never true  Transportation Needs: No Transportation Needs   Lack of Transportation (Medical): No   Lack of Transportation (Non-Medical): No  Physical Activity: Inactive   Days of Exercise per Week: 0 days   Minutes of Exercise per Session: 0 min  Stress: No Stress Concern Present   Feeling of Stress : Not at all  Social Connections: Moderately Isolated   Frequency of Communication with Friends and Family: Twice a week   Frequency of Social Gatherings with Friends and Family: Once a  week   Attends Religious Services: Never   Marine scientist or Organizations: No   Attends Music therapist: Never   Marital Status: Married    Tobacco Counseling Counseling given: Not Answered Tobacco comments: smoking cessation materials not required   Clinical Intake:  Pre-visit preparation completed: Yes  Pain : No/denies pain     BMI - recorded: 25.76 Nutritional Status: BMI 25 -29 Overweight Nutritional Risks: None Diabetes: No  How often do you need to have someone help you when you read instructions, pamphlets, or other written materials from your doctor or pharmacy?: 1 - Never  Interpreter Needed?: No  Information entered by :: Clemetine Marker LPN   Activities of Daily Living In your present state of health, do you have any difficulty performing the following activities: 06/26/2021 05/17/2021  Hearing? N N  Vision? N N  Difficulty concentrating or making decisions? Tempie Donning  Walking or climbing stairs? N N  Dressing or bathing? N N  Doing errands, shopping? Tempie Donning  Preparing Food and eating ? N -  Using the Toilet? N -  In the past six months, have you accidently leaked urine? N -  Do you have problems with loss of bowel control? N -  Managing your Medications? N -  Managing your Finances? N -  Housekeeping or managing your Housekeeping? N -  Some recent data might be hidden    Patient Care Team: Delsa Grana, PA-C as PCP - General (Family Medicine) Ubaldo Glassing Javier Docker, MD as Consulting Physician (Cardiology) Anthonette Legato, MD (Internal Medicine)  Indicate any recent Medical Services you may have received from other than [facility] providers in the past year (date may be approximate).     Assessment:   This is a routine wellness examination for [place].  Hearing/Vision screen Hearing Screening - Comments:: Pt denies hearing difficulty Vision Screening - Comments:: Vision screenings at [place] issues and exercise activities  discussed: Current Exercise Habits: The patient does not participate in regular exercise at present, Exercise limited by: cardiac condition(s)   Goals Addressed             This Visit's Progress    DIET - INCREASE WATER INTAKE   On track    Recommend to drink at least 6-8 8oz glasses of water per day.       Depression Screen PHQ 2/9 Scores 06/26/2021 05/17/2021 11/17/2020 09/21/2020 06/22/2020 05/19/2020 02/17/2020  PHQ - 2 Score 0 0 0 0 0 0 0  PHQ- 9 Score - 0 0 0 - 0 0    Fall Risk Fall Risk  06/26/2021 05/17/2021 11/17/2020 09/21/2020 06/22/2020  Falls in the past year? 0 0 0 0 0  Number falls in past yr: 0 0 0 0 0  Injury with Fall? 0 0 0 0 0  Risk for fall due to : No Fall Risks - No Fall Risks - No Fall Risks  Follow up Falls prevention discussed - Falls evaluation completed Falls evaluation completed Falls prevention discussed    FALL RISK PREVENTION PERTAINING TO THE HOME:  Any stairs in or around the home? Yes  If so, are there any without handrails? No  Home free of loose throw rugs in walkways, pet beds, electrical cords, etc? Yes  Adequate lighting in your home to reduce risk of falls? Yes   ASSISTIVE DEVICES UTILIZED TO PREVENT FALLS:  Life alert? No  Use of a cane, walker or w/c? No  Grab bars in the bathroom? Yes  Shower chair or bench in shower? Yes  Elevated toilet seat or a handicapped toilet? No   TIMED UP AND GO:  Was the test performed? Yes .  Length of time to ambulate 10 feet: 5 sec.   Gait steady and fast with assistive device  Cognitive Function:     6CIT Screen 06/22/2020 06/17/2019 03/12/2018  What Year? 0 points 0 points 0 points  What month? 0 points 0 points 0 points  What time? 0 points 0 points 3 points  Count back from 20 0 points 0 points 0 points  Months in reverse 0  points 0 points 0 points  Repeat phrase 0 points 0 points 4 points  Total Score 0 0 7    Immunizations Immunization History  Administered Date(s) Administered   Fluad  Quad(high Dose 65+) 08/02/2019, 06/22/2020, 06/26/2021   Influenza, High Dose Seasonal PF 11/21/2016, 09/09/2017, 08/21/2018   Moderna Sars-Covid-2 Vaccination 04/28/2020, 05/26/2020   Pneumococcal Conjugate-13 12/26/2016   Pneumococcal Polysaccharide-23 03/12/2018   Tdap 01/10/2016    TDAP status: Up to date  Flu Vaccine status: Completed at today's visit  Pneumococcal vaccine status: Up to date  Covid-19 vaccine status: Completed vaccines  Qualifies for Shingles Vaccine? Yes   Zostavax completed No   Shingrix Completed?: No.    Education has been provided regarding the importance of this vaccine. Patient has been advised to call insurance company to determine out of pocket expense if they have not yet received this vaccine. Advised may also receive vaccine at local pharmacy or Health Dept. Verbalized acceptance and understanding.  Screening Tests Health Maintenance  Topic Date Due   Zoster Vaccines- Shingrix (1 of 2) Never done   COVID-19 Vaccine (3 - Moderna risk series) 06/23/2020   COLONOSCOPY (Pts 45-74yrs Insurance coverage will need to be confirmed)  07/07/2024   TETANUS/TDAP  01/09/2026   INFLUENZA VACCINE  Completed   Hepatitis C Screening  Completed   PNA vac Low Risk Adult  Completed   HPV VACCINES  Aged Out    Health Maintenance  Health Maintenance Due  Topic Date Due   Zoster Vaccines- Shingrix (1 of 2) Never done   COVID-19 Vaccine (3 - Moderna risk series) 06/23/2020    Colorectal cancer screening: Type of screening: Colonoscopy. Completed 07/07/14. Repeat every 10 years  Lung Cancer Screening: (Low Dose CT Chest recommended if Age 43-80 years, 30 pack-year currently smoking OR have quit w/in 15years.) does not qualify.   Additional Screening:  Hepatitis C Screening: does qualify; Completed 06/23/17  Vision Screening: Recommended annual ophthalmology exams for early detection of glaucoma and other disorders of the eye. Is the patient up to date with  their annual eye exam?  Yes  Who is the provider or what is the name of the office in which the patient attends annual eye exams? [facility].   Dental Screening: Recommended annual dental exams for proper oral hygiene  Community Resource Referral / Chronic Care Management: CRR required this visit?  No   CCM required this visit?  No      Plan:     I have personally reviewed and noted the following in the patient's chart:   Medical and social history Use of alcohol, tobacco or illicit drugs  Current medications and supplements including opioid prescriptions. Patient is not currently taking opioid prescriptions. Functional ability and status Nutritional status Physical activity Advanced directives List of other physicians Hospitalizations, surgeries, and ER visits in previous 12 months Vitals Screenings to include cognitive, depression, and falls Referrals and appointments  In addition, I have reviewed and discussed with patient certain preventive protocols, quality metrics, and best practice recommendations. A written personalized care plan for preventive services as well as general preventive health recommendations were provided to patient.     Clemetine Marker, LPN   03/15/931   Nurse Notes: none

## 2021-06-26 NOTE — Patient Instructions (Signed)
Duane [name] , Thank you for taking time to come for your Medicare Wellness Visit. I appreciate your ongoing commitment to your health goals. Please review the following plan we discussed and let me know if I can assist you in the future.   Screening recommendations/referrals: Colonoscopy: done 07/07/14 Recommended yearly ophthalmology/optometry visit for glaucoma screening and checkup Recommended yearly dental visit for hygiene and checkup  Vaccinations: Influenza vaccine: done today Pneumococcal vaccine: done 03/12/18 Tdap vaccine: done 01/10/16 Shingles vaccine: Shingrix discussed. Please contact your pharmacy for coverage information.  Covid-19: done 04/28/20 & 05/26/20  Advanced directives: Advance directive discussed with you today. Even though you declined this today please call our office should you change your mind and we can give you the proper paperwork for you to fill out.   Conditions/risks identified: Recommend increasing physical activity  Next appointment: Follow up in one year for your annual wellness visit.   Preventive Care 66 Years and Older, Male Preventive care refers to lifestyle choices and visits with your health care provider that can promote health and wellness. What does preventive care include? A yearly physical exam. This is also called an annual well check. Dental exams once or twice a year. Routine eye exams. Ask your health care provider how often you should have your eyes checked. Personal lifestyle choices, including: Daily care of your teeth and gums. Regular physical activity. Eating a healthy diet. Avoiding tobacco and drug use. Limiting alcohol use. Practicing safe sex. Taking low doses of aspirin every day. Taking vitamin and mineral supplements as recommended by your health care provider. What happens during an annual well check? The services and screenings done by your health care provider during your annual well check will depend on your age,  overall health, lifestyle risk factors, and family history of disease. Counseling  Your health care provider may ask you questions about your: Alcohol use. Tobacco use. Drug use. Emotional well-being. Home and relationship well-being. Sexual activity. Eating habits. History of falls. Memory and ability to understand (cognition). Work and work Statistician. Screening  You may have the following tests or measurements: Height, weight, and BMI. Blood pressure. Lipid and cholesterol levels. These may be checked every 5 years, or more frequently if you are over 95 years old. Skin check. Lung cancer screening. You may have this screening every year starting at age 55 if you have a 30-pack-year history of smoking and currently smoke or have quit within the past 15 years. Fecal occult blood test (FOBT) of the stool. You may have this test every year starting at age 68. Flexible sigmoidoscopy or colonoscopy. You may have a sigmoidoscopy every 5 years or a colonoscopy every 10 years starting at age 71. Prostate cancer screening. Recommendations will vary depending on your family history and other risks. Hepatitis C blood test. Hepatitis B blood test. Sexually transmitted disease (STD) testing. Diabetes screening. This is done by checking your blood sugar (glucose) after you have not eaten for a while (fasting). You may have this done every 1-3 years. Abdominal aortic aneurysm (AAA) screening. You may need this if you are a current or former smoker. Osteoporosis. You may be screened starting at age 62 if you are at high risk. Talk with your health care provider about your test results, treatment options, and if necessary, the need for more tests. Vaccines  Your health care provider may recommend certain vaccines, such as: Influenza vaccine. This is recommended every year. Tetanus, diphtheria, and acellular pertussis (Tdap, Td) vaccine. You may  need a Td booster every 10 years. Zoster vaccine.  You may need this after age 15. Pneumococcal 13-valent conjugate (PCV13) vaccine. One dose is recommended after age 80. Pneumococcal polysaccharide (PPSV23) vaccine. One dose is recommended after age 74. Talk to your health care provider about which screenings and vaccines you need and how often you need them. This information is not intended to replace advice given to you by your health care provider. Make sure you discuss any questions you have with your health care provider. Document Released: 10/27/2015 Document Revised: 06/19/2016 Document Reviewed: 08/01/2015 Elsevier Interactive Patient Education  2017 [place] Prevention in the Home Falls can cause injuries. They can happen to people of all ages. There are many things you can do to make your home safe and to help prevent falls. What can I do on the outside of my home? Regularly fix the edges of walkways and driveways and fix any cracks. Remove anything that might make you trip as you walk through a door, such as a raised step or threshold. Trim any bushes or trees on the path to your home. Use bright outdoor lighting. Clear any walking paths of anything that might make someone trip, such as rocks or tools. Regularly check to see if handrails are loose or broken. Make sure that both sides of any steps have handrails. Any raised decks and porches should have guardrails on the edges. Have any leaves, snow, or ice cleared regularly. Use sand or salt on walking paths during winter. Clean up any spills in your garage right away. This includes oil or grease spills. What can I do in the bathroom? Use night lights. Install grab bars by the toilet and in the tub and shower. Do not use towel bars as grab bars. Use non-skid mats or decals in the tub or shower. If you need to sit down in the shower, use a plastic, non-slip stool. Keep the floor dry. Clean up any water that spills on the floor as soon as it happens. Remove soap  buildup in the tub or shower regularly. Attach bath mats securely with double-sided non-slip rug tape. Do not have throw rugs and other things on the floor that can make you trip. What can I do in the bedroom? Use night lights. Make sure that you have a light by your bed that is easy to reach. Do not use any sheets or blankets that are too big for your bed. They should not hang down onto the floor. Have a firm chair that has side arms. You can use this for support while you get dressed. Do not have throw rugs and other things on the floor that can make you trip. What can I do in the kitchen? Clean up any spills right away. Avoid walking on wet floors. Keep items that you use a lot in easy-to-reach places. If you need to reach something above you, use a strong step stool that has a grab bar. Keep electrical cords out of the way. Do not use floor polish or wax that makes floors slippery. If you must use wax, use non-skid floor wax. Do not have throw rugs and other things on the floor that can make you trip. What can I do with my stairs? Do not leave any items on the stairs. Make sure that there are handrails on both sides of the stairs and use them. Fix handrails that are broken or loose. Make sure that handrails are as long as the stairways.  Check any carpeting to make sure that it is firmly attached to the stairs. Fix any carpet that is loose or worn. Avoid having throw rugs at the top or bottom of the stairs. If you do have throw rugs, attach them to the floor with carpet tape. Make sure that you have a light switch at the top of the stairs and the bottom of the stairs. If you do not have them, ask someone to add them for you. What else can I do to help prevent falls? Wear shoes that: Do not have high heels. Have rubber bottoms. Are comfortable and fit you well. Are closed at the toe. Do not wear sandals. If you use a stepladder: Make sure that it is fully opened. Do not climb a closed  stepladder. Make sure that both sides of the stepladder are locked into place. Ask someone to hold it for you, if possible. Clearly mark and make sure that you can see: Any grab bars or handrails. First and last steps. Where the edge of each step is. Use tools that help you move around (mobility aids) if they are needed. These include: Canes. Walkers. Scooters. Crutches. Turn on the lights when you go into a dark area. Replace any light bulbs as soon as they burn out. Set up your furniture so you have a clear path. Avoid moving your furniture around. If any of your floors are uneven, fix them. If there are any pets around you, be aware of where they are. Review your medicines with your doctor. Some medicines can make you feel dizzy. This can increase your chance of falling. Ask your doctor what other things that you can do to help prevent falls. This information is not intended to replace advice given to you by your health care provider. Make sure you discuss any questions you have with your health care provider. Document Released: 07/27/2009 Document Revised: 03/07/2016 Document Reviewed: 11/04/2014 Elsevier Interactive Patient Education  2017 [employer].

## 2021-08-21 ENCOUNTER — Other Ambulatory Visit: Payer: Self-pay | Admitting: Family Medicine

## 2021-08-21 DIAGNOSIS — E890 Postprocedural hypothyroidism: Secondary | ICD-10-CM

## 2021-08-21 NOTE — Telephone Encounter (Signed)
Requested Prescriptions  Pending Prescriptions Disp Refills  . levothyroxine (SYNTHROID) 88 MCG tablet [Pharmacy Med Name: Levothyroxine Sodium 88 MCG Oral Tablet] 90 tablet 0    Sig: Take 1 tablet by mouth once daily     Endocrinology:  Hypothyroid Agents Failed - 08/21/2021  7:54 AM      Failed - TSH needs to be rechecked within 3 months after an abnormal result. Refill until TSH is due.      Passed - TSH in normal range and within 360 days    TSH  Date Value Ref Range Status  11/17/2020 1.82 0.40 - 4.50 mIU/L Final         Passed - Valid encounter within last 12 months    Recent Outpatient Visits          3 months ago Hypothyroidism following radioiodine therapy   [facility] Delsa Grana, PA-C   9 months ago Essential hypertension   [facility] Delsa Grana, [place]   11 months ago Attention deficit disorder (ADD) without hyperactivity   [facility] Delsa Grana, PA-C   1 year ago Essential hypertension   [facility] Delsa Grana, PA-C   1 year ago Encounter for examination following treatment at hospital   [facility] Delsa Grana, PA-C      Future Appointments            In 3 months Delsa Grana, PA-C [facility] [place]   In 10 months  [facility]

## 2021-08-23 ENCOUNTER — Telehealth: Payer: Self-pay | Admitting: Family Medicine

## 2021-08-23 ENCOUNTER — Other Ambulatory Visit: Payer: Self-pay | Admitting: Family Medicine

## 2021-08-23 DIAGNOSIS — E890 Postprocedural hypothyroidism: Secondary | ICD-10-CM

## 2021-08-23 NOTE — Telephone Encounter (Signed)
[place] [phone] called and spoke to [facility], Technician about the refill(s) levothyroxine 40mcg requested. Advised it was sent on 08/22/21 #90/0 refill(s). Transferred me to Marcie Bal, [facility] who states refill request was received but need authorization on changing the manufacturer. Notified Marcie Bal I will send this over and let Kristeen Miss make that decision.

## 2021-08-23 NOTE — Telephone Encounter (Signed)
Marcie Bal calling and is needing to have permission to switch manufacturers for the pts levothyroxine due to it being on back order. Please advise.

## 2021-08-23 NOTE — Telephone Encounter (Signed)
Requested medication (s) are due for refill today: Yes  Requested medication (s) are on the active medication list: Yes  Last refill:  08/21/21 #90/0 RF  Future visit scheduled: Yes  Notes to clinic:  Pharmacy wanting approval on switching manufacturer for medication. Can reach Marcie Bal, [facility] today to notify of change.       Requested Prescriptions  Pending Prescriptions Disp Refills   levothyroxine (SYNTHROID) 88 MCG tablet [Pharmacy Med Name: Levothyroxine Sodium 88 MCG Oral Tablet] 90 tablet 0    Sig: Take 1 tablet by mouth once daily     Endocrinology:  Hypothyroid Agents Failed - 08/23/2021 10:37 AM      Failed - TSH needs to be rechecked within 3 months after an abnormal result. Refill until TSH is due.      Passed - TSH in normal range and within 360 days    TSH  Date Value Ref Range Status  11/17/2020 1.82 0.40 - 4.50 mIU/L Final          Passed - Valid encounter within last 12 months    Recent Outpatient Visits           3 months ago Hypothyroidism following radioiodine therapy   [facility] Delsa Grana, PA-C   9 months ago Essential hypertension   [facility] Delsa Grana, [place]   11 months ago Attention deficit disorder (ADD) without hyperactivity   [facility] Delsa Grana, PA-C   1 year ago Essential hypertension   [facility] Delsa Grana, PA-C   1 year ago Encounter for examination following treatment at hospital   [facility] Delsa Grana, PA-C       Future Appointments             In 3 months Delsa Grana, PA-C [facility], [place]   In 10 months  [facility], [facility]

## 2021-08-23 NOTE — Telephone Encounter (Signed)
Return Marcie Bal call and told her it was okay to switch manufacturers. Confirmed with Roselyn Reef to do the change.

## 2021-09-27 DIAGNOSIS — N184 Chronic kidney disease, stage 4 (severe): Secondary | ICD-10-CM | POA: Diagnosis not present

## 2021-09-27 DIAGNOSIS — I10 Essential (primary) hypertension: Secondary | ICD-10-CM | POA: Diagnosis not present

## 2021-09-27 DIAGNOSIS — N2581 Secondary hyperparathyroidism of renal origin: Secondary | ICD-10-CM | POA: Diagnosis not present

## 2021-09-27 DIAGNOSIS — M109 Gout, unspecified: Secondary | ICD-10-CM | POA: Diagnosis not present

## 2021-11-16 ENCOUNTER — Other Ambulatory Visit: Payer: Self-pay | Admitting: Family Medicine

## 2021-11-16 DIAGNOSIS — Z79899 Other long term (current) drug therapy: Secondary | ICD-10-CM

## 2021-11-16 DIAGNOSIS — F988 Other specified behavioral and emotional disorders with onset usually occurring in childhood and adolescence: Secondary | ICD-10-CM

## 2021-11-16 MED ORDER — METHYLPHENIDATE HCL 5 MG PO TABS: ORAL_TABLET | ORAL | 0 refills | Status: DC

## 2021-11-16 NOTE — Telephone Encounter (Signed)
Medication Refill - Medication:  methylphenidate (RITALIN) 5 MG tablet   Has the patient contacted their pharmacy? Yes.   Couldn't get through  Preferred Pharmacy (with phone number or street name):  [place], [place] [facility] [place]  [address] [place] [address]  Phone:  [phone]  Fax:  [phone]   Has the patient been seen for an appointment in the last year OR does the patient have an upcoming appointment? Yes.    Agent: Please be advised that RX refills may take up to 3 business days. We ask that you follow-up with your pharmacy.

## 2021-11-16 NOTE — Telephone Encounter (Signed)
Requested medication (s) are due for refill today:   Provider to review  Requested medication (s) are on the active medication list:   Yes  Future visit scheduled:   Yes   Last ordered: 10/42022 #60, 0 refills  Returned because this is a non delegated refill   Requested Prescriptions  Pending Prescriptions Disp Refills   methylphenidate (RITALIN) 5 MG tablet 60 tablet 0    Sig: Take 5 mg po every morning and may take 5 mg po as needed in afternoon     Not Delegated - Psychiatry:  Stimulants/ADHD Failed - 11/16/2021 10:18 AM      Failed - This refill cannot be delegated      Failed - Urine Drug Screen completed in last 360 days      Passed - Last BP in normal range    BP Readings from Last 1 Encounters:  06/26/21 122/62          Passed - Last Heart Rate in normal range    Pulse Readings from Last 1 Encounters:  06/26/21 81          Passed - Valid encounter within last 6 months    Recent Outpatient Visits           6 months ago Hypothyroidism following radioiodine therapy   [facility] Delsa Grana, PA-C   12 months ago Essential hypertension   [facility] Delsa Grana, PA-C   1 year ago Attention deficit disorder (ADD) without hyperactivity   [facility] Delsa Grana, PA-C   1 year ago Essential hypertension   [facility] Delsa Grana, PA-C   1 year ago Encounter for examination following treatment at hospital   [facility] Delsa Grana, PA-C       Future Appointments             In 1 month Delsa Grana, PA-C [facility], [place]   In 7 months  [facility], [facility]

## 2021-11-23 ENCOUNTER — Ambulatory Visit: Payer: Medicare HMO | Admitting: Family Medicine

## 2021-11-26 ENCOUNTER — Other Ambulatory Visit: Payer: Self-pay | Admitting: Family Medicine

## 2021-11-26 DIAGNOSIS — E890 Postprocedural hypothyroidism: Secondary | ICD-10-CM

## 2021-12-06 DIAGNOSIS — E7800 Pure hypercholesterolemia, unspecified: Secondary | ICD-10-CM | POA: Diagnosis not present

## 2021-12-06 DIAGNOSIS — I77811 Abdominal aortic ectasia: Secondary | ICD-10-CM | POA: Diagnosis not present

## 2021-12-06 DIAGNOSIS — I38 Endocarditis, valve unspecified: Secondary | ICD-10-CM | POA: Diagnosis not present

## 2021-12-06 DIAGNOSIS — I480 Paroxysmal atrial fibrillation: Secondary | ICD-10-CM | POA: Diagnosis not present

## 2021-12-06 DIAGNOSIS — E782 Mixed hyperlipidemia: Secondary | ICD-10-CM | POA: Diagnosis not present

## 2022-01-04 ENCOUNTER — Ambulatory Visit (INDEPENDENT_AMBULATORY_CARE_PROVIDER_SITE_OTHER): Payer: Medicare HMO | Admitting: Family Medicine

## 2022-01-04 ENCOUNTER — Encounter: Payer: Self-pay | Admitting: Family Medicine

## 2022-01-04 VITALS — BP 124/68 | HR 96 | Temp 98.3°F | Resp 16 | Ht 76.0 in | Wt 220.7 lb

## 2022-01-04 DIAGNOSIS — D696 Thrombocytopenia, unspecified: Secondary | ICD-10-CM

## 2022-01-04 DIAGNOSIS — K219 Gastro-esophageal reflux disease without esophagitis: Secondary | ICD-10-CM

## 2022-01-04 DIAGNOSIS — F988 Other specified behavioral and emotional disorders with onset usually occurring in childhood and adolescence: Secondary | ICD-10-CM

## 2022-01-04 DIAGNOSIS — I10 Essential (primary) hypertension: Secondary | ICD-10-CM | POA: Diagnosis not present

## 2022-01-04 DIAGNOSIS — Z5181 Encounter for therapeutic drug level monitoring: Secondary | ICD-10-CM

## 2022-01-04 DIAGNOSIS — Z23 Encounter for immunization: Secondary | ICD-10-CM | POA: Diagnosis not present

## 2022-01-04 DIAGNOSIS — N401 Enlarged prostate with lower urinary tract symptoms: Secondary | ICD-10-CM

## 2022-01-04 DIAGNOSIS — F339 Major depressive disorder, recurrent, unspecified: Secondary | ICD-10-CM

## 2022-01-04 DIAGNOSIS — G8194 Hemiplegia, unspecified affecting left nondominant side: Secondary | ICD-10-CM | POA: Diagnosis not present

## 2022-01-04 DIAGNOSIS — N2581 Secondary hyperparathyroidism of renal origin: Secondary | ICD-10-CM | POA: Diagnosis not present

## 2022-01-04 DIAGNOSIS — E890 Postprocedural hypothyroidism: Secondary | ICD-10-CM | POA: Diagnosis not present

## 2022-01-04 DIAGNOSIS — E782 Mixed hyperlipidemia: Secondary | ICD-10-CM

## 2022-01-04 DIAGNOSIS — I482 Chronic atrial fibrillation, unspecified: Secondary | ICD-10-CM

## 2022-01-04 DIAGNOSIS — N184 Chronic kidney disease, stage 4 (severe): Secondary | ICD-10-CM

## 2022-01-04 DIAGNOSIS — G4700 Insomnia, unspecified: Secondary | ICD-10-CM | POA: Diagnosis not present

## 2022-01-04 DIAGNOSIS — I77811 Abdominal aortic ectasia: Secondary | ICD-10-CM

## 2022-01-04 DIAGNOSIS — R69 Illness, unspecified: Secondary | ICD-10-CM | POA: Diagnosis not present

## 2022-01-04 DIAGNOSIS — R350 Frequency of micturition: Secondary | ICD-10-CM

## 2022-01-04 DIAGNOSIS — D631 Anemia in chronic kidney disease: Secondary | ICD-10-CM

## 2022-01-04 DIAGNOSIS — Z79899 Other long term (current) drug therapy: Secondary | ICD-10-CM

## 2022-01-04 MED ORDER — METHYLPHENIDATE HCL 5 MG PO TABS: ORAL_TABLET | ORAL | 0 refills | Status: DC

## 2022-01-04 MED ORDER — SHINGRIX 50 MCG/0.5ML IM SUSR: 0.5000 mL | Freq: Once | INTRAMUSCULAR | 1 refills | Status: AC

## 2022-01-04 NOTE — Progress Notes (Signed)
? ?Name: Duane [name]   MRN: [id number]    DOB: [birth year]   Date:01/04/2022 ? ?     Progress Note ? ?Chief Complaint  ?Patient presents with  ? Follow-up  ? Hypertension  ? Hyperlipidemia  ? Insomnia  ? ? ? ?Subjective:  ? ?Duane [name] is a 71 y.o. male, presents to clinic for routine f/up and med refil ? ?CKD stage 4 - managed by nephrology - outside labs and recent OV reviewed ?Secondary hyperparathyroid, anemia and chronic gout - pt states no concerns, no gout flare, no change in management, last GFR per nephro reviewed ?On losartan and allopurinol per nephro ?Pt reports good med compliance and denies any SE.   ?HTN: ?Blood pressure today is well controlled. ?BP Readings from Last 3 Encounters:  ?01/04/22 124/68  ?06/26/21 122/62  ?05/17/21 116/72  ?Pt denies CP, SOB, exertional sx, LE edema, palpitation, Ha's, visual disturbances, lightheadedness, hypotension, syncope. ? ?Hx of stroke residual attention deficit, MDD, left hemiparesis - pt states no dysphagia issues ?Insomnia on remeron - sleeping well ?On methylphenidate 5 mg once to twice daily and SSRI-prozac 20 mg ?Overall mood stable - no changes or concerns ? ?  01/04/2022  ?  9:19 AM 06/26/2021  ? 10:59 AM 05/17/2021  ? 10:24 AM  ?Depression screen PHQ 2/9  ?Decreased Interest 0 0 0  ?Down, Depressed, Hopeless 0 0 0  ?PHQ - 2 Score 0 0 0  ?Altered sleeping 0  0  ?Tired, decreased energy 0  0  ?Change in appetite 0  0  ?Feeling bad or failure about yourself  0  0  ?Trouble concentrating 0  0  ?Moving slowly or fidgety/restless 0  0  ?Suicidal thoughts 0  0  ?PHQ-9 Score 0  0  ?Difficult doing work/chores Not difficult at all  Not difficult at all  ?Phq neg and reviewed today ?Cholesterol not well controlled with pravastatin - changed to crestor 10 mg, has taken for the past 6-7 months, no SE or sx/concerns ?Lab Results  ?Component Value Date  ? CHOL 183 05/17/2021  ? HDL 43 05/17/2021  ? LDLCALC 115 (H) 05/17/2021  ? TRIG 133 05/17/2021  ?  CHOLHDL 4.3 05/17/2021  ?States cannot take lipitor or other statins due to past liver damage/LFT elevation - did a whole work up with specialist and we advised to not take lipitor - on 40 mg pravastatin - not working on diet efforts very much, but he doesn't eat very much either ? ?Afib on eliquis, cardizam - no CP, palpitations, hx of afib with RVR, no recent episodes of feeling change in palpitations or new syncope/hypotension ? ?Hypothyroidism: out of meds ?Current Medication Regimen: 88 mcg synthroid ?Takes medicine corrently ?Current Symptoms: denies fatigue, weight changes, heat/cold intolerance, bowel/skin changes or CVS symptoms ?Most recent results are below; we will not be repeating labs today - per pt preference ?Lab Results  ?Component Value Date  ? TSH 1.82 11/17/2020  ? ?GERD on pepcid BID daily, controls sx, no abd pain, coughing, diarrhea, blood in stool/melena - stable, no change ? ?BPH - on cardura, urinary frequency throughout the day, no nocturia, no straining, dribbling, double voiding ? ? ?Current Outpatient Medications:  ?  allopurinol (ZYLOPRIM) 300 MG tablet, Take 300 mg by mouth daily., Disp: , Rfl:  ?  apixaban (ELIQUIS) 5 MG TABS tablet, Take 1 tablet by mouth every 12 (twelve) hours., Disp: , Rfl:  ?  Ascorbic Acid (VITAMIN C) 1000 MG tablet, Take  1 tablet by mouth daily., Disp: , Rfl:  ?  calcitRIOL (ROCALTROL) 0.25 MCG capsule, Take 0.25 mcg daily by mouth. , Disp: , Rfl:  ?  Cholecalciferol (VITAMIN D3) 2000 units capsule, Take 2,000 Units by mouth daily., Disp: , Rfl:  ?  cyanocobalamin 500 MCG tablet, Take 500 mcg by mouth daily., Disp: , Rfl:  ?  diltiazem (CARDIZEM CD) 120 MG 24 hr capsule, Take 120 mg by mouth daily., Disp: , Rfl:  ?  doxazosin (CARDURA) 1 MG tablet, Take 1 tablet by mouth once daily, Disp: 90 tablet, Rfl: 3 ?  famotidine (PEPCID) 20 MG tablet, Take 1 tablet (20 mg total) by mouth 2 (two) times daily as needed for heartburn or indigestion., Disp: 180 tablet,  Rfl: 3 ?  ferrous sulfate 325 (65 FE) MG tablet, Take 325 mg by mouth daily with breakfast. , Disp: , Rfl:  ?  FLUoxetine (PROZAC) 20 MG tablet, Take 1 tablet (20 mg total) by mouth daily., Disp: 90 tablet, Rfl: 3 ?  levothyroxine (SYNTHROID) 88 MCG tablet, Take 1 tablet by mouth once daily, Disp: 30 tablet, Rfl: 0 ?  losartan (COZAAR) 25 MG tablet, Take 1 tablet by mouth daily., Disp: , Rfl:  ?  methylphenidate (RITALIN) 5 MG tablet, Take 5 mg po every morning and may take 5 mg po as needed in afternoon, Disp: 60 tablet, Rfl: 0 ?  mirtazapine (REMERON) 15 MG tablet, Take 1 tablet (15 mg total) by mouth at bedtime., Disp: 90 tablet, Rfl: 3 ?  rosuvastatin (CRESTOR) 10 MG tablet, Take 1 tablet (10 mg total) by mouth daily., Disp: 90 tablet, Rfl: 3 ? ?Patient Active Problem List  ? Diagnosis Date Noted  ? Thrombocytopenia ([place]) 12/27/2020  ? Hypomagnesemia 11/10/2020  ? Valvular heart disease 11/10/2020  ? Hemochromatosis, hereditary ([place]) 07/21/2020  ? Episode of recurrent major depressive disorder ([place]) 06/11/2019  ? Aortic ectasia, abdominal ([place]) 05/22/2018  ? Hyperparathyroidism, secondary renal ([place]) 10/02/2017  ? Controlled substance agreement signed 05/23/2017  ? On stimulant medication 02/22/2017  ? Vitamin D deficiency 09/04/2015  ? Anemia, chronic renal failure 06/09/2015  ? Chronic kidney disease, stage IV (severe) ([place])   ? Hypertension   ? Chronic gout due to renal impairment   ? Hyperlipidemia   ? Hypothyroidism following radioiodine therapy   ? Restrictive lung disease   ? Chronic atrial fibrillation (HCC)   ? History of arterial ischemic stroke 02/21/2014  ? Left hemiparesis ([place]) 02/11/2014  ? ? ?Past Surgical History:  ?Procedure Laterality Date  ? CATARACT EXTRACTION W/PHACO Right 08/27/2016  ? Procedure: CATARACT EXTRACTION PHACO AND INTRAOCULAR LENS PLACEMENT (IOC);  Surgeon: Eulogio Bear, MD;  Location: [place] [facility];  Service: Ophthalmology;  Laterality: Right;  RIGHT  ? ENTEROSTOMY  CLOSURE  11/27/15  ? ileostomy takedown  ? EXPLORATORY LAPAROTOMY  11/27/15  ? illeostomy  07/2014  ? POLYPECTOMY  10/15  ? GI surgery to remove polyp  ? ? ?Family History  ?Problem Relation Age of Onset  ? Arthritis Mother   ? Heart disease Mother   ? Heart attack Father   ? Heart disease Father   ? Cirrhosis Sister   ? Hyperparathyroidism Neg Hx   ? ? ?Social History  ? ?Tobacco Use  ? Smoking status: Never  ? Smokeless tobacco: Never  ? Tobacco comments:  ?  smoking cessation materials not required  ?Vaping Use  ? Vaping Use: Never used  ?Substance Use Topics  ? Alcohol use: No  ?  Drug use: No  ?  ? ?Allergies  ?Allergen Reactions  ? Bactrim [Sulfamethoxazole-Trimethoprim] Other (See Comments)  ?  Hyperkalemia  ? Chlorpromazine Other (See Comments)  ?  Hiccups, hallucinations  ? Cyclobenzaprine Other (See Comments)  ?  Sleeps for 8 hrs after taking it  ? Oxycodone Other (See Comments)  ?  Excessive sleepiness  ? ? ?Health Maintenance  ?Topic Date Due  ? Zoster Vaccines- Shingrix (1 of 2) Never done  ? COVID-19 Vaccine (3 - Moderna risk series) 01/20/2022 (Originally 06/23/2020)  ? COLONOSCOPY (Pts 45-32yr Insurance coverage will need to be confirmed)  07/07/2024  ? TETANUS/TDAP  01/09/2026  ? Pneumonia Vaccine 71 Years old  Completed  ? INFLUENZA VACCINE  Completed  ? Hepatitis C Screening  Completed  ? HPV VACCINES  Aged Out  ? ? ?Chart Review Today: ?I personally reviewed active problem list, medication list, allergies, family history, social history, health maintenance, notes from last encounter, lab results, imaging with the patient/caregiver today. ?Spent more than 10 min for chart prep, chart and lab review through care everywhere of recent labs and specialists OV ? ?Review of Systems  ?Constitutional: Negative.   ?HENT: Negative.    ?Eyes: Negative.   ?Respiratory: Negative.    ?Cardiovascular: Negative.   ?Gastrointestinal: Negative.   ?Endocrine: Negative.   ?Genitourinary: Negative.   ?Musculoskeletal:  Negative.   ?Skin: Negative.   ?Allergic/Immunologic: Negative.   ?Neurological: Negative.   ?Hematological: Negative.   ?Psychiatric/Behavioral: Negative.    ?All other systems reviewed and are negative.

## 2022-01-05 LAB — COMPLETE METABOLIC PANEL WITH GFR
AG Ratio: 1.6 (calc) (ref 1.0–2.5)
ALT: 24 U/L (ref 9–46)
AST: 26 U/L (ref 10–35)
Albumin: 4 g/dL (ref 3.6–5.1)
Alkaline phosphatase (APISO): 79 U/L (ref 35–144)
BUN/Creatinine Ratio: 10 (calc) (ref 6–22)
BUN: 28 mg/dL — ABNORMAL HIGH (ref 7–25)
CO2: 23 mmol/L (ref 20–32)
Calcium: 9.6 mg/dL (ref 8.6–10.3)
Chloride: 110 mmol/L (ref 98–110)
Creat: 2.8 mg/dL — ABNORMAL HIGH (ref 0.70–1.28)
Globulin: 2.5 g/dL (calc) (ref 1.9–3.7)
Glucose, Bld: 88 mg/dL (ref 65–99)
Potassium: 5.2 mmol/L (ref 3.5–5.3)
Sodium: 142 mmol/L (ref 135–146)
Total Bilirubin: 0.5 mg/dL (ref 0.2–1.2)
Total Protein: 6.5 g/dL (ref 6.1–8.1)
eGFR: 24 mL/min/{1.73_m2} — ABNORMAL LOW (ref >=60)

## 2022-01-05 LAB — LIPID PANEL
Cholesterol: 137 mg/dL (ref <200)
HDL: 42 mg/dL (ref >=40)
LDL Cholesterol (Calc): 75 mg/dL (calc)
Non-HDL Cholesterol (Calc): 95 mg/dL (calc) (ref <130)
Total CHOL/HDL Ratio: 3.3 (calc) (ref <5.0)
Triglycerides: 112 mg/dL (ref <150)

## 2022-01-05 LAB — TSH: TSH: 1.54 mIU/L (ref 0.40–4.50)

## 2022-01-07 ENCOUNTER — Other Ambulatory Visit: Payer: Self-pay | Admitting: Family Medicine

## 2022-01-07 DIAGNOSIS — E890 Postprocedural hypothyroidism: Secondary | ICD-10-CM

## 2022-01-07 MED ORDER — LEVOTHYROXINE SODIUM 88 MCG PO TABS: 88.0000 ug | ORAL_TABLET | Freq: Every day | ORAL | 3 refills | Status: DC

## 2022-01-28 DIAGNOSIS — N184 Chronic kidney disease, stage 4 (severe): Secondary | ICD-10-CM | POA: Diagnosis not present

## 2022-01-28 DIAGNOSIS — I10 Essential (primary) hypertension: Secondary | ICD-10-CM | POA: Diagnosis not present

## 2022-01-28 DIAGNOSIS — N2581 Secondary hyperparathyroidism of renal origin: Secondary | ICD-10-CM | POA: Diagnosis not present

## 2022-01-28 DIAGNOSIS — D631 Anemia in chronic kidney disease: Secondary | ICD-10-CM | POA: Diagnosis not present

## 2022-03-25 ENCOUNTER — Other Ambulatory Visit: Payer: Self-pay | Admitting: Family Medicine

## 2022-03-25 DIAGNOSIS — F988 Other specified behavioral and emotional disorders with onset usually occurring in childhood and adolescence: Secondary | ICD-10-CM

## 2022-03-25 DIAGNOSIS — Z79899 Other long term (current) drug therapy: Secondary | ICD-10-CM

## 2022-03-25 MED ORDER — DOXAZOSIN MESYLATE 1 MG PO TABS: 1.0000 mg | ORAL_TABLET | Freq: Every day | ORAL | 2 refills | Status: DC

## 2022-03-25 NOTE — Telephone Encounter (Signed)
Medication Refill: methylphenidate (RITALIN) 5 MG tablet [[id number]]  Pharmacy:  [facility], [place] Phone:  [phone]  Fax:  [phone]      Going out of town on 03/29/22

## 2022-03-26 ENCOUNTER — Other Ambulatory Visit: Payer: Self-pay

## 2022-03-26 DIAGNOSIS — F988 Other specified behavioral and emotional disorders with onset usually occurring in childhood and adolescence: Secondary | ICD-10-CM

## 2022-03-26 DIAGNOSIS — Z79899 Other long term (current) drug therapy: Secondary | ICD-10-CM

## 2022-03-26 MED ORDER — METHYLPHENIDATE HCL 5 MG PO TABS: ORAL_TABLET | ORAL | 0 refills | Status: DC

## 2022-05-03 ENCOUNTER — Other Ambulatory Visit: Payer: Self-pay | Admitting: Family Medicine

## 2022-05-06 ENCOUNTER — Other Ambulatory Visit: Payer: Self-pay

## 2022-05-09 ENCOUNTER — Other Ambulatory Visit: Payer: Self-pay | Admitting: Family Medicine

## 2022-05-09 DIAGNOSIS — F339 Major depressive disorder, recurrent, unspecified: Secondary | ICD-10-CM

## 2022-05-09 DIAGNOSIS — I69398 Other sequelae of cerebral infarction: Secondary | ICD-10-CM

## 2022-05-13 ENCOUNTER — Other Ambulatory Visit: Payer: Self-pay | Admitting: Family Medicine

## 2022-05-13 DIAGNOSIS — K219 Gastro-esophageal reflux disease without esophagitis: Secondary | ICD-10-CM

## 2022-05-30 DIAGNOSIS — D631 Anemia in chronic kidney disease: Secondary | ICD-10-CM | POA: Diagnosis not present

## 2022-05-30 DIAGNOSIS — I10 Essential (primary) hypertension: Secondary | ICD-10-CM | POA: Diagnosis not present

## 2022-05-30 DIAGNOSIS — N2581 Secondary hyperparathyroidism of renal origin: Secondary | ICD-10-CM | POA: Diagnosis not present

## 2022-05-30 DIAGNOSIS — N184 Chronic kidney disease, stage 4 (severe): Secondary | ICD-10-CM | POA: Diagnosis not present

## 2022-05-31 ENCOUNTER — Other Ambulatory Visit: Payer: Self-pay | Admitting: Family Medicine

## 2022-05-31 DIAGNOSIS — Z79899 Other long term (current) drug therapy: Secondary | ICD-10-CM

## 2022-05-31 DIAGNOSIS — F988 Other specified behavioral and emotional disorders with onset usually occurring in childhood and adolescence: Secondary | ICD-10-CM

## 2022-05-31 NOTE — Telephone Encounter (Signed)
Not on his current medication list. Pt last OV note states to follow up around 07/11/2022 which he is already set for that day. If patient is wanting that medication he needs to get a different appointment schedule in sooner.

## 2022-05-31 NOTE — Telephone Encounter (Signed)
Requested medication is not on current list, routing for review.

## 2022-05-31 NOTE — Telephone Encounter (Signed)
Medication Refill - Medication: Adderall '5mg'$   Has the patient contacted their pharmacy? No. (Agent: If no, request that the patient contact the pharmacy for the refill. If patient does not wish to contact the pharmacy document the reason why and proceed with request.) (Agent: If yes, when and what did the pharmacy advise?)  Preferred Pharmacy (with phone number or street name): [facility] [place] Has the patient been seen for an appointment in the last year OR does the patient have an upcoming appointment? yes  Agent: Please be advised that RX refills may take up to 3 business days. We ask that you follow-up with your pharmacy.

## 2022-05-31 NOTE — Telephone Encounter (Signed)
Called and spoke to wife and it isn't adderell that pt is requesting it is Ritalin.

## 2022-06-03 MED ORDER — METHYLPHENIDATE HCL 5 MG PO TABS: ORAL_TABLET | ORAL | 0 refills | Status: DC

## 2022-06-12 DIAGNOSIS — E782 Mixed hyperlipidemia: Secondary | ICD-10-CM | POA: Diagnosis not present

## 2022-06-12 DIAGNOSIS — E7800 Pure hypercholesterolemia, unspecified: Secondary | ICD-10-CM | POA: Diagnosis not present

## 2022-06-12 DIAGNOSIS — I38 Endocarditis, valve unspecified: Secondary | ICD-10-CM | POA: Diagnosis not present

## 2022-06-12 DIAGNOSIS — I480 Paroxysmal atrial fibrillation: Secondary | ICD-10-CM | POA: Diagnosis not present

## 2022-06-27 ENCOUNTER — Ambulatory Visit (INDEPENDENT_AMBULATORY_CARE_PROVIDER_SITE_OTHER): Payer: Medicare HMO

## 2022-06-27 VITALS — BP 128/80 | HR 98 | Temp 97.8°F | Resp 18 | Ht 76.0 in | Wt 215.5 lb

## 2022-06-27 DIAGNOSIS — Z0000 Encounter for general adult medical examination without abnormal findings: Secondary | ICD-10-CM | POA: Diagnosis not present

## 2022-06-27 NOTE — Progress Notes (Unsigned)
Subjective:   Duane [name] is a 71 y.o. male who presents for Medicare Annual/Subsequent preventive examination.  Review of Systems    Defer to PCP Cardiac Risk Factors include: advanced age (>62mn, >>79women);family history of premature cardiovascular disease     Objective:    There were no vitals filed for this visit. There is no height or weight on file to calculate BMI.     06/27/2022   11:19 AM 06/26/2021   11:02 AM 06/22/2020   11:04 AM 01/21/2020    5:17 PM 06/17/2019   10:57 AM 03/12/2018    3:29 PM 05/23/2017    8:52 AM  Advanced Directives  Does Patient Have a Medical Advance Directive? No No No No No No No  Would patient like information on creating a medical advance directive? No - Patient declined No - Patient declined No - Patient declined No - Patient declined Yes (MAU/Ambulatory/Procedural Areas - Information given) Yes (MAU/Ambulatory/Procedural Areas - Information given)     Current Medications (verified) Outpatient Encounter Medications as of 06/27/2022  Medication Sig   allopurinol (ZYLOPRIM) 300 MG tablet Take 300 mg by mouth daily.   apixaban (ELIQUIS) 5 MG TABS tablet Take 1 tablet by mouth every 12 (twelve) hours.   Ascorbic Acid (VITAMIN C) 1000 MG tablet Take 1 tablet by mouth daily.   calcitRIOL (ROCALTROL) 0.25 MCG capsule Take 0.25 mcg daily by mouth.    Cholecalciferol (VITAMIN D3) 2000 units capsule Take 2,000 Units by mouth daily.   cyanocobalamin 500 MCG tablet Take 500 mcg by mouth daily.   diltiazem (CARDIZEM CD) 120 MG 24 hr capsule Take 120 mg by mouth daily.   doxazosin (CARDURA) 1 MG tablet Take 1 tablet (1 mg total) by mouth daily.   famotidine (PEPCID) 20 MG tablet TAKE 1 TABLET BY MOUTH TWICE DAILY AS NEEDED FOR  HEARTBURN  OR  INDIGESTION   ferrous sulfate 325 (65 FE) MG tablet Take 325 mg by mouth daily with breakfast.    FLUoxetine (PROZAC) 20 MG tablet Take 1 tablet by mouth once daily   levothyroxine (SYNTHROID) 88 MCG tablet  Take 1 tablet (88 mcg total) by mouth daily before breakfast.   losartan (COZAAR) 25 MG tablet Take 1 tablet by mouth daily.   methylphenidate (RITALIN) 5 MG tablet Take 5 mg po every morning and may take 5 mg po as needed in afternoon   mirtazapine (REMERON) 15 MG tablet Take 1 tablet (15 mg total) by mouth at bedtime.   rosuvastatin (CRESTOR) 10 MG tablet Take 1 tablet by mouth once daily   [DISCONTINUED] allopurinol (ZYLOPRIM) 300 MG tablet Take 1 tablet by mouth daily.   No facility-administered encounter medications on file as of 06/27/2022.    Allergies (verified) Bactrim [sulfamethoxazole-trimethoprim], Chlorpromazine, Cyclobenzaprine, and Oxycodone   History: Past Medical History:  Diagnosis Date   A-fib ([facility]    requiring life-long anticouagulation   Aortic ectasia, abdominal ([facility] 05/22/[place]   [place]   [place] 01/[place]   Cerebellar infarct (HCC)    Chronic low back pain without sciatica 08/21/2016   CKD (chronic kidney disease)    creatinine baseline 1.3-1.5 in 2015   Colon polyp 11/27/2015   GERD (gastroesophageal reflux disease)    Gout    Graves disease    H/O hyperkalemia    with Bactrim   Hyperlipidemia    requiring life long statin therapy   Hyper[place], secondary renal ([facility] 10/02/2017   Hypertension  Hypomagnesemia    Hypothyroidism    Left hemiparesis [facility]) May 2015   s/p strke   Poor short term memory    Renal insufficiency    Restrictive lung disease    mild   Stroke ([place]) 02/20/14   left side hemiparesis, dysphagia   UTI (urinary tract infection) due to Enterococcus May 2015   Vitamin D deficiency disease    Past Surgical History:  Procedure Laterality Date   CATARACT EXTRACTION W/PHACO Right 08/27/2016   Procedure: CATARACT EXTRACTION PHACO AND INTRAOCULAR LENS PLACEMENT ([place]);  Surgeon: Eulogio Bear, MD;  Location: [place];  Service: Ophthalmology;  Laterality: Right;  RIGHT    ENTEROSTOMY CLOSURE  11/27/15   ileostomy takedown   EXPLORATORY LAPAROTOMY  11/27/15   illeostomy  07/2014   POLYPECTOMY  10/15   GI surgery to remove polyp   Family History  Problem Relation Age of Onset   Arthritis Mother    Heart disease Mother    Heart attack Father    Heart disease Father    Cirrhosis Sister    Hyperparathyroidism Neg Hx    Social History   Socioeconomic History   Marital status: Married    Spouse name: Curt Bears   Number of children: 2   Years of education: Not on file   Highest education level: 12th grade  Occupational History   Occupation: Retired  Tobacco Use   Smoking status: Never   Smokeless tobacco: Never   Tobacco comments:    smoking cessation materials not required  Vaping Use   Vaping Use: Never used  Substance and Sexual Activity   Alcohol use: No   Drug use: No   Sexual activity: Not Currently  Other Topics Concern   Not on file  Social History Narrative   Not on file   Social Determinants of Health   Financial Resource Strain: Low Risk  (06/26/2021)   Overall Financial Resource Strain (CARDIA)    Difficulty of Paying Living Expenses: Not hard at all  Food Insecurity: No Food Insecurity (06/26/2021)   Hunger Vital Sign    Worried About Running Out of Food in the Last Year: Never true    [place] in the Last Year: Never true  Transportation Needs: No Transportation Needs (06/26/2021)   PRAPARE - Hydrologist (Medical): No    Lack of Transportation (Non-Medical): No  Physical Activity: Inactive (06/26/2021)   Exercise Vital Sign    Days of Exercise per Week: 0 days    Minutes of Exercise per Session: 0 min  Stress: No Stress Concern Present (06/26/2021)   [place]    Feeling of Stress : Not at all  Social Connections: Moderately Isolated (06/26/2021)   Social Connection and Isolation Panel [NHANES]    Frequency of Communication  with Friends and Family: Twice a week    Frequency of Social Gatherings with Friends and Family: Once a week    Attends Religious Services: Never    Marine scientist or Organizations: No    Attends Archivist Meetings: Never    Marital Status: Married    Tobacco Counseling Counseling given: Not Answered Tobacco comments: smoking cessation materials not required   Clinical Intake:                 Diabetic?N/A         Activities of Daily Living    06/27/2022  11:20 AM 01/04/2022    9:19 AM  In your present state of health, do you have any difficulty performing the following activities:  Hearing? 0 0  Vision? 0 0  Difficulty concentrating or making decisions? 0 0  Walking or climbing stairs? 0 0  Dressing or bathing? 0 0  Doing errands, shopping? 0 0  Preparing Food and eating ? N   Using the Toilet? N   In the past six months, have you accidently leaked urine? N   Do you have problems with loss of bowel control? N   Managing your Medications? N   Managing your Finances? N   Housekeeping or managing your Housekeeping? N     Patient Care Team: Delsa Grana, PA-C as PCP - General (Family Medicine) Ubaldo Glassing Javier Docker, MD as Consulting Physician (Cardiology) Anthonette Legato, MD (Internal Medicine)  Indicate any recent Medical Services you may have received from other than [facility] providers in the past year (date may be approximate).     Assessment:   This is a routine wellness examination for [place].  Hearing/Vision screen No results found.  Dietary issues and exercise activities discussed: Current Exercise Habits: Home exercise routine, Type of exercise: walking, Time (Minutes): 20, Frequency (Times/Week): 7, Weekly Exercise (Minutes/Week): 140, Intensity: Moderate, Exercise limited by: None identified   Goals Addressed   None   Depression Screen    01/04/2022    9:19 AM 06/26/2021   10:59 AM 05/17/2021   10:24 AM 11/17/2020   11:16 AM  09/21/2020    3:43 PM 06/22/2020   11:03 AM 05/19/2020    2:44 PM  PHQ 2/9 Scores  PHQ - 2 Score 0 0 0 0 0 0 0  PHQ- 9 Score 0  0 0 0  0    Fall Risk    06/27/2022   11:20 AM 01/04/2022    9:19 AM 06/26/2021   11:03 AM 05/17/2021   10:12 AM 11/17/2020   11:15 AM  Fall Risk   Falls in the past year? 0 0 0 0 0  Number falls in past yr:  0 0 0 0  Injury with Fall?  0 0 0 0  Risk for fall due to : No Fall Risks No Fall Risks No Fall Risks  No Fall Risks  Follow up Education provided;Falls prevention discussed;Falls evaluation completed Falls prevention discussed Falls prevention discussed  Falls evaluation completed    FALL RISK PREVENTION PERTAINING TO THE HOME:  Any stairs in or around the home? Yes  If so, are there any without handrails? No  Home free of loose throw rugs in walkways, pet beds, electrical cords, etc? Yes  Adequate lighting in your home to reduce risk of falls? Yes   ASSISTIVE DEVICES UTILIZED TO PREVENT FALLS:  Life alert? No  Use of a cane, walker or w/c? Yes  Grab bars in the bathroom? No  Shower chair or bench in shower? No  Elevated toilet seat or a handicapped toilet? No   TIMED UP AND GO:  Was the test performed? Yes .  Length of time to ambulate 10 feet: 5 sec.   Gait steady and fast without use of assistive device  Cognitive Function:        06/27/2022   11:20 AM 06/22/2020   11:06 AM 06/17/2019   10:59 AM 03/12/2018    3:33 PM  6CIT Screen  What Year? 0 points 0 points 0 points 0 points  What month? 0 points 0 points 0 points  0 points  What time? 0 points 0 points 0 points 3 points  Count back from 20 0 points 0 points 0 points 0 points  Months in reverse 0 points 0 points 0 points 0 points  Repeat phrase 0 points 0 points 0 points 4 points  Total Score 0 points 0 points 0 points 7 points    Immunizations Immunization History  Administered Date(s) Administered   Fluad Quad(high Dose 65+) 08/02/2019, 06/22/2020, 06/26/2021   Influenza, High  Dose Seasonal PF 11/21/2016, 09/09/2017, 08/21/2018   Moderna Sars-Covid-2 Vaccination 04/28/2020, 05/26/2020   Pneumococcal Conjugate-13 12/26/2016   Pneumococcal Polysaccharide-23 03/12/2018   Tdap 01/10/2016    TDAP status: Up to date  Flu Vaccine status: Completed at today's visit  Pneumococcal vaccine status: Up to date  Covid-19 vaccine status: Completed vaccines  Qualifies for Shingles Vaccine? Yes   Zostavax completed No   Shingrix Completed?: No.    Education has been provided regarding the importance of this vaccine. Patient has been advised to call insurance company to determine out of pocket expense if they have not yet received this vaccine. Advised may also receive vaccine at local pharmacy or Health Dept. Verbalized acceptance and understanding.  Screening Tests Health Maintenance  Topic Date Due   Zoster Vaccines- Shingrix (1 of 2) Never done   COVID-19 Vaccine (3 - Moderna risk series) 06/23/2020   INFLUENZA VACCINE  05/14/2022   COLONOSCOPY (Pts 45-64yr Insurance coverage will need to be confirmed)  07/07/2024   TETANUS/TDAP  01/09/2026   Pneumonia Vaccine 71 Years old  Completed   Hepatitis C Screening  Completed   HPV VACCINES  Aged Out    Health Maintenance  Health Maintenance Due  Topic Date Due   Zoster Vaccines- Shingrix (1 of 2) Never done   COVID-19 Vaccine (3 - Moderna risk series) 06/23/2020   INFLUENZA VACCINE  05/14/2022    Colorectal cancer screening: Type of screening: Colonoscopy. Completed 06/17/2014. Repeat every 10 years  Lung Cancer Screening: (Low Dose CT Chest recommended if Age 71-80years, 30 pack-year currently smoking OR have quit w/in 15years.) does not qualify.   Lung Cancer Screening Referral: n/a  Additional Screening:  Hepatitis C Screening: does not qualify; Completed 09/10/018  Vision Screening: Recommended annual ophthalmology exams for early detection of glaucoma and other disorders of the eye. Is the patient up  to date with their annual eye exam?  Yes  Who is the provider or what is the name of the office in which the patient attends annual eye exams? [facility] pt is not established with a provider, would they like to be referred to a provider to establish care? No .   Dental Screening: Recommended annual dental exams for proper oral hygiene  Community Resource Referral / Chronic Care Management: CRR required this visit?  No   CCM required this visit?  No      Plan:     I have personally reviewed and noted the following in the patient's chart:   Medical and social history Use of alcohol, tobacco or illicit drugs  Current medications and supplements including opioid prescriptions. Patient is not currently taking opioid prescriptions. Functional ability and status Nutritional status Physical activity Advanced directives List of other physicians Hospitalizations, surgeries, and ER visits in previous 12 months Vitals Screenings to include cognitive, depression, and falls Referrals and appointments  In addition, I have reviewed and discussed with patient certain preventive protocols, quality metrics, and best practice recommendations. A written  personalized care plan for preventive services as well as general preventive health recommendations were provided to patient.     Royal Hawthorn, [place]   06/27/2022   Nurse Notes:   Mr. Vallier , Thank you for taking time to come for your Medicare Wellness Visit. I appreciate your ongoing commitment to your health goals. Please review the following plan we discussed and let me know if I can assist you in the future.   These are the goals we discussed:  Goals      DIET - INCREASE WATER INTAKE     Recommend to drink at least 6-8 8oz glasses of water per day.        This is a list of the screening recommended for you and due dates:  Health Maintenance  Topic Date Due   Zoster (Shingles) Vaccine (1 of 2) Never done    COVID-19 Vaccine (3 - Moderna risk series) 06/23/2020   Flu Shot  05/14/2022   Colon Cancer Screening  07/07/2024   Tetanus Vaccine  01/09/2026   Pneumonia Vaccine  Completed   Hepatitis C Screening: USPSTF Recommendation to screen - Ages 18-79 yo.  Completed   HPV Vaccine  Aged Out

## 2022-07-08 ENCOUNTER — Telehealth: Payer: Medicare HMO | Admitting: Family Medicine

## 2022-07-11 ENCOUNTER — Telehealth: Payer: Medicare HMO | Admitting: Family Medicine

## 2022-07-19 ENCOUNTER — Encounter: Payer: Self-pay | Admitting: Family Medicine

## 2022-07-19 ENCOUNTER — Ambulatory Visit (INDEPENDENT_AMBULATORY_CARE_PROVIDER_SITE_OTHER): Payer: Medicare HMO | Admitting: Family Medicine

## 2022-07-19 VITALS — BP 116/72 | HR 94 | Temp 98.2°F | Resp 16 | Ht 76.0 in | Wt 217.1 lb

## 2022-07-19 DIAGNOSIS — F02A11 Dementia in other diseases classified elsewhere, mild, with agitation: Secondary | ICD-10-CM

## 2022-07-19 DIAGNOSIS — E782 Mixed hyperlipidemia: Secondary | ICD-10-CM | POA: Diagnosis not present

## 2022-07-19 DIAGNOSIS — I38 Endocarditis, valve unspecified: Secondary | ICD-10-CM

## 2022-07-19 DIAGNOSIS — I10 Essential (primary) hypertension: Secondary | ICD-10-CM | POA: Diagnosis not present

## 2022-07-19 DIAGNOSIS — G4700 Insomnia, unspecified: Secondary | ICD-10-CM

## 2022-07-19 DIAGNOSIS — N184 Chronic kidney disease, stage 4 (severe): Secondary | ICD-10-CM | POA: Diagnosis not present

## 2022-07-19 DIAGNOSIS — F988 Other specified behavioral and emotional disorders with onset usually occurring in childhood and adolescence: Secondary | ICD-10-CM

## 2022-07-19 DIAGNOSIS — Z7901 Long term (current) use of anticoagulants: Secondary | ICD-10-CM | POA: Insufficient documentation

## 2022-07-19 DIAGNOSIS — D696 Thrombocytopenia, unspecified: Secondary | ICD-10-CM

## 2022-07-19 DIAGNOSIS — F331 Major depressive disorder, recurrent, moderate: Secondary | ICD-10-CM

## 2022-07-19 DIAGNOSIS — G8194 Hemiplegia, unspecified affecting left nondominant side: Secondary | ICD-10-CM | POA: Diagnosis not present

## 2022-07-19 DIAGNOSIS — Z23 Encounter for immunization: Secondary | ICD-10-CM

## 2022-07-19 DIAGNOSIS — E890 Postprocedural hypothyroidism: Secondary | ICD-10-CM

## 2022-07-19 DIAGNOSIS — I4891 Unspecified atrial fibrillation: Secondary | ICD-10-CM

## 2022-07-19 DIAGNOSIS — M1A30X0 Chronic gout due to renal impairment, unspecified site, without tophus (tophi): Secondary | ICD-10-CM

## 2022-07-19 DIAGNOSIS — N2581 Secondary hyperparathyroidism of renal origin: Secondary | ICD-10-CM

## 2022-07-19 DIAGNOSIS — D631 Anemia in chronic kidney disease: Secondary | ICD-10-CM

## 2022-07-19 DIAGNOSIS — R69 Illness, unspecified: Secondary | ICD-10-CM | POA: Diagnosis not present

## 2022-07-19 MED ORDER — METHYLPHENIDATE HCL 5 MG PO TABS: ORAL_TABLET | ORAL | 0 refills | Status: DC

## 2022-07-19 MED ORDER — ROSUVASTATIN CALCIUM 10 MG PO TABS: 10.0000 mg | ORAL_TABLET | Freq: Every day | ORAL | 3 refills | Status: DC

## 2022-07-19 MED ORDER — MIRTAZAPINE 15 MG PO TABS: 15.0000 mg | ORAL_TABLET | Freq: Every day | ORAL | 3 refills | Status: DC

## 2022-07-19 MED ORDER — SHINGRIX 50 MCG/0.5ML IM SUSR: 0.5000 mL | Freq: Once | INTRAMUSCULAR | 1 refills | Status: AC

## 2022-07-19 NOTE — Progress Notes (Signed)
Name: Duane [name]   MRN: [id number]    DOB: Mar 07, 1951   Date:07/19/2022       Progress Note  Chief Complaint  Patient presents with   Follow-up   Hypertension   Hypothyroidism   Hyperlipidemia   Depression     Subjective:   Duane [name] is a 71 y.o. male, presents to clinic for routine f/up and med refill Pt sees multiple other specialists - they recent visit and labs were reviewed through care everywhere today with the patient and his wife  Upon entering the exam room today the patient was upset and arguing with his wife something about being irritated for repeated blood pressure today They did buy a new blood pressure cuff/wrist cuff to help assist with virtual visits and obtaining vital signs, blood pressure wrist cuff was compared to multiple manual blood pressures today and is very accurate when his arm is placed correctly  Pt's wife reports that his mood most of the time are good, but if he gets frustrated or upset he is explosive with his anger and agitation.  It has been like this since his stroke in 2015.  He is on prozac, and low dose ritalin, uses remeron at night, no other meds needed previously for acute agitation.  Reviewed chart further to see stoke history - went to [place] in 2015 for stoke like sx, had right cervical ICA occlusion MRI showed right hemispheric and cerebellar acute infarcts in MCA and PICA distributions, largest area of involvement right temporal lobe, etiology most likely embolic secondary to aFib, TTE and thrombectomy was done, he had dysarthia and left sided weakness. Last neurology consult that I can see was 02/2016 at [facility] then only noted left sided slowing Per hx of care in primary care pt has had left hemiparesis, mood changes, memory deficits, attention deficits, mild ataxia, dysarthria and flattened affect Pts wife states he was significantly different with mood/behavior after stroke, the documentation that I can see from back then  at [place] is minimal and states he largely recovered with only mild left hemiparesis. After 2017 I can see no neurology consults or cognitive/memory eval     07/19/2022   11:06 AM 06/27/2022   11:31 AM 01/04/2022    9:19 AM  Depression screen PHQ 2/9  Decreased Interest 0 0 0  Down, Depressed, Hopeless 0 0 0  PHQ - 2 Score 0 0 0  Altered sleeping 0  0  Tired, decreased energy 0  0  Change in appetite 0  0  Feeling bad or failure about yourself  0  0  Trouble concentrating 0  0  Moving slowly or fidgety/restless 0  0  Suicidal thoughts 0  0  PHQ-9 Score 0  0  Difficult doing work/chores Not difficult at all  Not difficult at all  Pt states hes fine and feels good every day except when he has to come to the doctors [place] prozac 10, methylphenidate 5 once daily - no tachycardia, weight chagnes, dry mouth, cp - for memory and attention s/p stroke, previously on 2-3 dose a day Remeron q bedtime helps with sleep, no concerns Discussed with wife prn meds that may be helpful for when/if pt gets very aggitated  HTN/ckd stage 3b-4 per nephrology and cardiology He is on losartan 25 mg  BP Readings from Last 3 Encounters:  07/19/22 116/72  06/27/22 128/80  01/04/22 124/68  BP well controlled today  PaFib in cardizem rate controlled and anticoagulated with eliquis  per cardiology  HR controlled today, no palpitations, SOB, CP, orthopnea, hypotension, near syncope weight change - he follows with Dr. Ubaldo Glassing [facility] cardiology regularly Pulse Readings from Last 3 Encounters:  07/19/22 94  06/27/22 98  01/04/22 96   HLD  Last lipids at goal on crestor, tolerating and good med compliance  CKD stage 3b-4 per nephrology with secondary hyperparathyroid and gout, mild anemia he sees nephrology every 3 months last labs reviewed, he is on iron supplement, B12, Vit D, calcitriol, allopurinol with no recent gouty flares Recent GFR decline rechecked last 29  Impression/Recommendations  Duane [name] is a  71 y.o. 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  1. Chronic kidney disease stage IV. At the last visit the patient's chronic kidney disease was found to be a bit worse with most recent EGFR 23 and albumin creatinine ratio 15. We plan to follow-up renal parameters today and maintain the patient on losartan 25 mg daily.  2. Hypertension. Blood pressure currently 123/69. We plan to maintain the patient on diltiazem, doxazosin, losartan.  3. Secondary hyperparathyroidism. Most recent PTH was 63, phosphorus 3.6, calcium 9.8. Maintain the patient on Calcitrol 0.25 mcg p.o. daily.  4. Anemia of chronic kidney disease. Most recent hemoglobin is 13.3. No indication for Procrit. Repeat CBC today.  Return in about 3 months (around 08/30/2022).  Anemia:   05/30/2022 - HGB 12.7, HCT 38.1, platelet 129 Hemoglobin  Date Value Ref Range Status  02/17/2020 12.7 (L) 13.2 - 17.1 g/dL Final  01/25/2020 13.7 13.0 - 17.0 g/dL Final  01/24/2020 12.8 (L) 13.0 - 17.0 g/dL Final  01/23/2020 13.2 13.0 - 17.0 g/dL Final  10/21/2018 12.9 (L) 13.0 - 17.7 g/dL Final  06/09/2015 12.2 (L) 12.6 - 17.7 g/dL Final    Last Cardiology OV reviewed and copied below - multiple meds documented that we do not have on med list: Assessment and Plan   71 y.o. male with  ICD-10-CM ICD-9-CM  1. Persistent atrial fibrillation-currently in sinus bradycardia with both metoprolol and amiodarone. Will continue with Eliquis. We will continue with current regimen and follow. Renal function shows a creatinine of 2.3. We will continue with this dose of Eliquis following renal function. His creatinine is greater than 1.5 however his weight is more than 80 kg he is less than  53 years of age. I48.1 427.31  2. Atypical chest pain -no evidence of ischemia. R07.89 786.59  3. Mixed hyperlipidemia-stable. Back on pravastatin. Will follow liver function E78.2 272.2  4. History of arterial ischemic stroke; Right ICA occlusion from A Fib embolism - treated with tPA andmechanical throbectomy. Remains on anticoagulation with Eliquis. No further neurologic deficits. Z86.73 V12.54  5. CKD-being followed by nephrology.  Return in about 6 months (around 12/12/2022). These notes generated with voice recognition software. I apologize for typographical errors.  Sydnee Levans, MD Electronically signed by Sydnee Levans, MD at 06/12/2022 10:56 AM EDT   Pt not on metoprolol, amiodorone or pravastatin - on cardizem 120 mg daily since admission in 2021 for afib with RVR, and on crestor 10 mg with confirmed refills several times this year.  Hypothyroid- Graves dz s/p medical therapy 20+ years ago -  pt states mood/energy/weight all stable, on 88 mcg daily, last labs were normal he does  not wish to do labs today Lab Results  Component Value Date   TSH 1.54 01/04/2022      Current Outpatient Medications:    allopurinol (ZYLOPRIM) 300 MG tablet, Take 300 mg by mouth daily., Disp: , Rfl:    apixaban (ELIQUIS) 5 MG TABS tablet, Take 1 tablet by mouth every 12 (twelve) hours., Disp: , Rfl:    Ascorbic Acid (VITAMIN C) 1000 MG tablet, Take 1 tablet by mouth daily., Disp: , Rfl:    calcitRIOL (ROCALTROL) 0.25 MCG capsule, Take 0.25 mcg daily by mouth. , Disp: , Rfl:    Cholecalciferol (VITAMIN D3) 2000 units capsule, Take 2,000 Units by mouth daily., Disp: , Rfl:    cyanocobalamin 500 MCG tablet, Take 500 mcg by mouth daily., Disp: , Rfl:    diltiazem (CARDIZEM CD) 120 MG 24 hr capsule, Take 120 mg by mouth daily., Disp: , Rfl:    doxazosin (CARDURA) 1 MG tablet, Take 1 tablet (1 mg total) by mouth daily., Disp: 90 tablet, Rfl: 2   famotidine (PEPCID) 20 MG tablet, TAKE 1 TABLET  BY MOUTH TWICE DAILY AS NEEDED FOR  HEARTBURN  OR  INDIGESTION, Disp: 180 tablet, Rfl: 0   ferrous sulfate 325 (65 FE) MG tablet, Take 325 mg by mouth daily with breakfast. , Disp: , Rfl:    FLUoxetine (PROZAC) 20 MG tablet, Take 1 tablet by mouth once daily, Disp: 90 tablet, Rfl: 3   levothyroxine (SYNTHROID) 88 MCG tablet, Take 1 tablet (88 mcg total) by mouth daily before breakfast., Disp: 90 tablet, Rfl: 3   losartan (COZAAR) 25 MG tablet, Take 1 tablet by mouth daily., Disp: , Rfl:    methylphenidate (RITALIN) 5 MG tablet, Take 5 mg po every morning and may take 5 mg po as needed in afternoon, Disp: 120 tablet, Rfl: 0   mirtazapine (REMERON) 15 MG tablet, Take 1 tablet (15 mg total) by mouth at bedtime., Disp: 90 tablet, Rfl: 3   rosuvastatin (CRESTOR) 10 MG tablet, Take 1 tablet by mouth once daily, Disp: 90 tablet, Rfl: 0  Patient Active Problem List   Diagnosis Date Noted   Thrombocytopenia ([place]) 12/27/2020   Hypomagnesemia 11/10/2020   Valvular heart disease 11/10/2020   Hemochromatosis, hereditary ([place]) 07/21/2020   Episode of recurrent major depressive disorder ([place]) 06/11/2019   Aortic ectasia, abdominal ([place]) 05/22/2018   Hyperparathyroidism, secondary renal ([place]) 10/02/2017   Controlled substance agreement signed 05/23/2017   On stimulant medication 02/22/2017   Vitamin D deficiency 09/04/2015   Anemia, chronic renal failure 06/09/2015   Chronic kidney disease, stage IV (severe) (HCC)    Hypertension    Chronic gout due to renal impairment    Hyperlipidemia    Hypothyroidism following radioiodine therapy    Restrictive lung disease    Chronic atrial fibrillation ([place])    History of arterial ischemic stroke 02/21/2014   Left hemiparesis ([place]) 02/11/2014    Past Surgical History:  Procedure Laterality Date   CATARACT EXTRACTION W/PHACO Right 08/27/2016   Procedure: CATARACT EXTRACTION PHACO AND INTRAOCULAR LENS PLACEMENT ([place]);  Surgeon: Eulogio Bear, MD;   Location: [facility];  Service: Ophthalmology;  Laterality: Right;  RIGHT   ENTEROSTOMY CLOSURE  11/27/15   ileostomy takedown   EXPLORATORY LAPAROTOMY  11/27/15   illeostomy  07/2014   POLYPECTOMY  10/15   GI surgery to remove polyp    Family History  Problem Relation Age of Onset   Arthritis Mother    Heart disease Mother  Heart attack Father    Heart disease Father    Cirrhosis Sister    Hyperparathyroidism Neg Hx     Social History   Tobacco Use   Smoking status: Never   Smokeless tobacco: Never   Tobacco comments:    smoking cessation materials not required  Vaping Use   Vaping Use: Never used  Substance Use Topics   Alcohol use: No   Drug use: No     Allergies  Allergen Reactions   Bactrim [Sulfamethoxazole-Trimethoprim] Other (See Comments)    Hyperkalemia   Chlorpromazine Other (See Comments)    Hiccups, hallucinations   Cyclobenzaprine Other (See Comments)    Sleeps for 8 hrs after taking it   Oxycodone Other (See Comments)    Excessive sleepiness    Health Maintenance  Topic Date Due   Zoster Vaccines- Shingrix (1 of 2) Never done   COVID-19 Vaccine (3 - Moderna risk series) 06/23/2020   COLONOSCOPY (Pts 45-104yr Insurance coverage will need to be confirmed)  07/07/2024   TETANUS/TDAP  01/09/2026   Pneumonia Vaccine 71 Years old  Completed   INFLUENZA VACCINE  Completed   Hepatitis C Screening  Completed   HPV VACCINES  Aged Out    Chart Review Today: I personally reviewed active problem list, medication list, allergies, family history, social history, health maintenance, notes from last encounter, lab results, imaging with the patient/caregiver today.   Review of Systems  Constitutional: Negative.   HENT: Negative.    Eyes: Negative.   Respiratory: Negative.    Cardiovascular: Negative.   Gastrointestinal: Negative.   Endocrine: Negative.   Genitourinary: Negative.   Musculoskeletal: Negative.   Skin: Negative.    Allergic/Immunologic: Negative.   Neurological: Negative.   Hematological: Negative.   Psychiatric/Behavioral: Negative.    All other systems reviewed and are negative.    Objective:   Vitals:   07/19/22 1107  BP: 116/72  Pulse: 94  Resp: 16  Temp: 98.2 F (36.8 C)  TempSrc: Oral  SpO2: 99%  Weight: 217 lb 1.6 oz (98.5 kg)  Height: _0  (1.93 m)    Body mass index is 26.43 kg/m.  Physical Exam Vitals and nursing note reviewed.  Constitutional:      General: He is not in acute distress.    Appearance: Normal appearance. He is well-developed, well-groomed and overweight. He is not ill-appearing, toxic-appearing or diaphoretic.  HENT:     Head: Normocephalic and atraumatic.     Nose: Nose normal.  Eyes:     General:        Right eye: No discharge.        Left eye: No discharge.     Conjunctiva/sclera: Conjunctivae normal.  Neck:     Trachea: No tracheal deviation.  Cardiovascular:     Rate and Rhythm: Normal rate and regular rhythm.     Pulses: Normal pulses.     Heart sounds: Normal heart sounds. No murmur heard.    No friction rub. No gallop.  Pulmonary:     Effort: Pulmonary effort is normal. No respiratory distress.     Breath sounds: Normal breath sounds. No stridor. No wheezing, rhonchi or rales.  Musculoskeletal:     Right lower leg: No edema.     Left lower leg: No edema.  Skin:    General: Skin is warm and dry.     Findings: No bruising, erythema or rash.  Neurological:     Mental Status: He is alert. Mental status is at  baseline.     Motor: No abnormal muscle tone.     Coordination: Coordination normal.  Psychiatric:        Mood and Affect: Mood is not anxious or depressed. Affect is flat (mildly).        Speech: Speech is delayed and slurred.        Behavior: Behavior is agitated. Behavior is not slowed, aggressive, withdrawn or combative.     Comments: Pt upset/irritable today         Assessment & Plan:   Problem List Items Addressed  This Visit       Cardiovascular and Mediastinum   Essential hypertension - Primary    On losartan for renal protection and cardizem for afib BP and HR both at goal today, labs reviewed from nephrology from august      Relevant Medications   rosuvastatin (CRESTOR) 10 MG tablet   Atrial fibrillation ([place])    On cardizem, currently RRR in office, asx, long term anticoagulation per cardiology, no bleeding sign or sx, reviewed renal function and CBC       Relevant Medications   rosuvastatin (CRESTOR) 10 MG tablet   Valvular heart disease    Monitored by Cardiology      Relevant Medications   rosuvastatin (CRESTOR) 10 MG tablet     Endocrine   Hypothyroidism following radioiodine therapy (Chronic)    Pt denies any concerns related to thyroid - reports clinical euthyroid Last labs normal No recent dose changes, good med compliance and correct med administration  On 88 mcg levothyroxine No changes, will recheck TSH in 6 months      Hyperparathyroidism, secondary renal ([place])    Per nephrology, last labs reviewed on calcitriol, vit d, allopurinol        Nervous and Auditory   Left hemiparesis ([place])   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, with agitation ([place])    Reviewed chart more thorough regarding hx of stroke from 2015, last neurology note in 2017, with not much documentation on behavioral, cognitive, memory changes/deficits though he is on meds and wife reports dramatic change after stroke - on prozac, remeron and low dose ritalin ir He reports his mood as good and his wife does too except if he gets upset, which seems to happen easily and since I have met them he seems to be more aggitated and upset more often. Pts wife states she is safe and other than him verbally "exploding" usually they both avoid each other and then later he is back to normal.  There has never been an injury and pt has not used and needed any adjustment in mood/mdd meds, to  add any antipsychotics or anxiolytics Ritalin dose and frequency has been decreased and minimized over the past couple years so unlikely to be secondary to med se It would be good to get a new neurocognitive screening done with neurology - except pt is adamant about avoiding dr appt and any additional consults or appts will likely lead to an outburst. Discussed briefly with pts wife Possible CCM SW referral or palliative care could be helpful for support and assessment w/o more specialists visits?      Relevant Medications   mirtazapine (REMERON) 15 MG tablet   methylphenidate (RITALIN) 5 MG tablet (Start on 08/19/2022)     Musculoskeletal and Integument   Chronic gout due to renal impairment    Well-controlled on allopurinol 300 mg daily, managed by nephrology, no recent gouty flares  Genitourinary   Chronic kidney disease, stage IV (severe) (HCC) (Chronic)    Per nephrology, last GFR was 29, 83-monthlabs and follow-up, blood pressure is well controlled today      Anemia, chronic renal failure    Patient has stable mild anemia secondary to chronic kidney disease he is on iron supplement and anticoagulated without any signs or symptoms concerning for bleeding        Hematopoietic and Hemostatic   Thrombocytopenia (HCC)    Mild, monitored by nephrology        Other   Hyperlipidemia    Good statin compliance, has been on Crestor 10 mg daily, last labs showed excellent LDL control, will be due for labs in 6 months      Relevant Medications   rosuvastatin (CRESTOR) 10 MG tablet   Episode of recurrent major depressive disorder ([place]    Reported moods day-to-day are good, negative PHQ-9 screening, on fluoxetine 10 mg daily      Relevant Medications   mirtazapine (REMERON) 15 MG tablet   Insomnia    Symptoms well controlled Remeron      Relevant Medications   mirtazapine (REMERON) 15 MG tablet   Long term current use of anticoagulant    Monitoring      Attention  deficit disorder (ADD) without hyperactivity    memory deficits s/p right MCA stroke 2015 On methylphenidate once daily, previously up to [place] Controlled substance database reviewed and meds refilled  Does not seem to be having any mood, BP, HR side effects - does still help with cognitive function, memory, attention, focus       Relevant Medications   methylphenidate (RITALIN) 5 MG tablet (Start on 08/19/2022)   Other Visit Diagnoses     Need for shingles vaccine       Relevant Medications   Zoster Vaccine Adjuvanted ([facility] injection        Return in about 6 months (around 01/18/2023) for Routine follow-up needed labs .   LDelsa Grana PA-C 07/19/22 11:18 AM

## 2022-07-19 NOTE — Assessment & Plan Note (Signed)
Good statin compliance, has been on Crestor 10 mg daily, last labs showed excellent LDL control, will be due for labs in 6 months

## 2022-07-19 NOTE — Assessment & Plan Note (Signed)
Symptoms well controlled Remeron

## 2022-07-19 NOTE — Assessment & Plan Note (Signed)
On losartan for renal protection and cardizem for afib BP and HR both at goal today, labs reviewed from nephrology from august

## 2022-07-19 NOTE — Assessment & Plan Note (Signed)
Mild, monitored by nephrology

## 2022-07-19 NOTE — Assessment & Plan Note (Signed)
Patient has stable mild anemia secondary to chronic kidney disease he is on iron supplement and anticoagulated without any signs or symptoms concerning for bleeding

## 2022-07-19 NOTE — Assessment & Plan Note (Signed)
Reviewed chart more thorough regarding hx of stroke from 2015, last neurology note in 2017, with not much documentation on behavioral, cognitive, memory changes/deficits though he is on meds and wife reports dramatic change after stroke - on prozac, remeron and low dose ritalin ir He reports his mood as good and his wife does too except if he gets upset, which seems to happen easily and since I have met them he seems to be more aggitated and upset more often. Pts wife states she is safe and other than him verbally "exploding" usually they both avoid each other and then later he is back to normal.  There has never been an injury and pt has not used and needed any adjustment in mood/mdd meds, to add any antipsychotics or anxiolytics Ritalin dose and frequency has been decreased and minimized over the past couple years so unlikely to be secondary to med se It would be good to get a new neurocognitive screening done with neurology - except pt is adamant about avoiding dr appt and any additional consults or appts will likely lead to an outburst. Discussed briefly with pts wife Possible CCM SW referral or palliative care could be helpful for support and assessment w/o more specialists visits?

## 2022-07-19 NOTE — Assessment & Plan Note (Signed)
Monitoring

## 2022-07-19 NOTE — Assessment & Plan Note (Signed)
Monitored by Cardiology

## 2022-07-19 NOTE — Assessment & Plan Note (Signed)
On cardizem, currently RRR in office, asx, long term anticoagulation per cardiology, no bleeding sign or sx, reviewed renal function and CBC

## 2022-07-19 NOTE — Assessment & Plan Note (Signed)
memory deficits s/p right MCA stroke 2015 On methylphenidate once daily, previously up to [place] Controlled substance database reviewed and meds refilled  Does not seem to be having any mood, BP, HR side effects - does still help with cognitive function, memory, attention, focus

## 2022-07-19 NOTE — Assessment & Plan Note (Signed)
Pt denies any concerns related to thyroid - reports clinical euthyroid Last labs normal No recent dose changes, good med compliance and correct med administration  On 88 mcg levothyroxine No changes, will recheck TSH in 6 months

## 2022-07-19 NOTE — Assessment & Plan Note (Signed)
Well-controlled on allopurinol 300 mg daily, managed by nephrology, no recent gouty flares

## 2022-07-19 NOTE — Assessment & Plan Note (Signed)
Per nephrology, last labs reviewed on calcitriol, vit d, allopurinol

## 2022-07-19 NOTE — Assessment & Plan Note (Signed)
Reported moods day-to-day are good, negative PHQ-9 screening, on fluoxetine 10 mg daily

## 2022-07-19 NOTE — Assessment & Plan Note (Signed)
Per nephrology, last GFR was 29, 78-monthlabs and follow-up, blood pressure is well controlled today

## 2022-08-02 ENCOUNTER — Other Ambulatory Visit: Payer: Self-pay | Admitting: Family Medicine

## 2022-08-02 DIAGNOSIS — K219 Gastro-esophageal reflux disease without esophagitis: Secondary | ICD-10-CM

## 2022-10-09 DIAGNOSIS — D631 Anemia in chronic kidney disease: Secondary | ICD-10-CM | POA: Diagnosis not present

## 2022-10-09 DIAGNOSIS — N184 Chronic kidney disease, stage 4 (severe): Secondary | ICD-10-CM | POA: Diagnosis not present

## 2022-10-09 DIAGNOSIS — N2581 Secondary hyperparathyroidism of renal origin: Secondary | ICD-10-CM | POA: Diagnosis not present

## 2022-10-09 DIAGNOSIS — I10 Essential (primary) hypertension: Secondary | ICD-10-CM | POA: Diagnosis not present

## 2023-01-17 ENCOUNTER — Ambulatory Visit: Payer: Medicare HMO | Admitting: Family Medicine

## 2023-01-24 ENCOUNTER — Ambulatory Visit (INDEPENDENT_AMBULATORY_CARE_PROVIDER_SITE_OTHER): Payer: Medicare HMO | Admitting: Family Medicine

## 2023-01-24 ENCOUNTER — Encounter: Payer: Self-pay | Admitting: Family Medicine

## 2023-01-24 VITALS — BP 108/68 | HR 95 | Temp 97.8°F | Resp 16 | Ht 76.0 in | Wt 218.3 lb

## 2023-01-24 DIAGNOSIS — G4700 Insomnia, unspecified: Secondary | ICD-10-CM | POA: Diagnosis not present

## 2023-01-24 DIAGNOSIS — I77811 Abdominal aortic ectasia: Secondary | ICD-10-CM | POA: Diagnosis not present

## 2023-01-24 DIAGNOSIS — Z79899 Other long term (current) drug therapy: Secondary | ICD-10-CM

## 2023-01-24 DIAGNOSIS — E782 Mixed hyperlipidemia: Secondary | ICD-10-CM | POA: Diagnosis not present

## 2023-01-24 DIAGNOSIS — I4891 Unspecified atrial fibrillation: Secondary | ICD-10-CM

## 2023-01-24 DIAGNOSIS — N2581 Secondary hyperparathyroidism of renal origin: Secondary | ICD-10-CM

## 2023-01-24 DIAGNOSIS — I10 Essential (primary) hypertension: Secondary | ICD-10-CM | POA: Diagnosis not present

## 2023-01-24 DIAGNOSIS — N184 Chronic kidney disease, stage 4 (severe): Secondary | ICD-10-CM

## 2023-01-24 DIAGNOSIS — G8194 Hemiplegia, unspecified affecting left nondominant side: Secondary | ICD-10-CM

## 2023-01-24 DIAGNOSIS — F988 Other specified behavioral and emotional disorders with onset usually occurring in childhood and adolescence: Secondary | ICD-10-CM | POA: Diagnosis not present

## 2023-01-24 DIAGNOSIS — F331 Major depressive disorder, recurrent, moderate: Secondary | ICD-10-CM

## 2023-01-24 DIAGNOSIS — D696 Thrombocytopenia, unspecified: Secondary | ICD-10-CM | POA: Diagnosis not present

## 2023-01-24 DIAGNOSIS — R69 Illness, unspecified: Secondary | ICD-10-CM | POA: Diagnosis not present

## 2023-01-24 DIAGNOSIS — E890 Postprocedural hypothyroidism: Secondary | ICD-10-CM

## 2023-01-24 DIAGNOSIS — F02A11 Dementia in other diseases classified elsewhere, mild, with agitation: Secondary | ICD-10-CM

## 2023-01-24 MED ORDER — LEVOTHYROXINE SODIUM 88 MCG PO TABS: 88.0000 ug | ORAL_TABLET | Freq: Every day | ORAL | 3 refills | Status: DC

## 2023-01-24 MED ORDER — DOXAZOSIN MESYLATE 1 MG PO TABS: 1.0000 mg | ORAL_TABLET | Freq: Every day | ORAL | 2 refills | Status: DC

## 2023-01-24 MED ORDER — METHYLPHENIDATE HCL 5 MG PO TABS
ORAL_TABLET | ORAL | 0 refills | Status: DC
Start: 2023-01-24 — End: 2023-11-04

## 2023-01-24 MED ORDER — METHYLPHENIDATE HCL 5 MG PO TABS
ORAL_TABLET | ORAL | 0 refills | Status: DC
Start: 2023-04-25 — End: 2023-06-25

## 2023-01-24 MED ORDER — MIRTAZAPINE 15 MG PO TABS
15.0000 mg | ORAL_TABLET | Freq: Every day | ORAL | 3 refills | Status: DC
Start: 2023-01-24 — End: 2023-11-21

## 2023-01-24 NOTE — Progress Notes (Unsigned)
Name: Duane [name]   MRN: [id number]    DOB: 1950-12-10   Date:01/24/2023       Progress Note  Chief Complaint  Patient presents with   Follow-up   Hypertension   Hyperlipidemia   Depression   Insomnia     Subjective:   Duane [name] is a 72 y.o. male, presents to clinic for routine f/up and med refill  Annually due for lipids, TSH Otherwise he follows with nephrology and those labs are reviewed today Last labs done Dec    Chronic afib on eliquis managed by specialists Hx of stroke  Mood/attention issues managed on prozac and ritalin - sx stable Insomnia managed by remeron    01/24/2023   11:06 AM 07/19/2022   11:06 AM 06/27/2022   11:31 AM  Depression screen PHQ 2/9  Decreased Interest 0 0 0  Down, Depressed, Hopeless 0 0 0  PHQ - 2 Score 0 0 0  Altered sleeping 0 0   Tired, decreased energy 0 0   Change in appetite 0 0   Feeling bad or failure about yourself  0 0   Trouble concentrating 0 0   Moving slowly or fidgety/restless 0 0   Suicidal thoughts 0 0   PHQ-9 Score 0 0   Difficult doing work/chores Not difficult at all Not difficult at all    No sig weight or mood changes per pt and wife Wt Readings from Last 5 Encounters:  01/24/23 218 lb 4.8 oz (99 kg)  07/19/22 217 lb 1.6 oz (98.5 kg)  06/27/22 215 lb 8 oz (97.8 kg)  01/04/22 220 lb 11.2 oz (100.1 kg)  06/26/21 211 lb 9.6 oz (96 kg)   BMI Readings from Last 5 Encounters:  01/24/23 26.57 kg/m  07/19/22 26.43 kg/m  06/27/22 26.23 kg/m  01/04/22 26.86 kg/m  06/26/21 25.76 kg/m     Current Outpatient Medications:    allopurinol (ZYLOPRIM) 300 MG tablet, Take 300 mg by mouth daily., Disp: , Rfl:    apixaban (ELIQUIS) 5 MG TABS tablet, Take 1 tablet by mouth every 12 (twelve) hours., Disp: , Rfl:    Ascorbic Acid (VITAMIN C) 1000 MG tablet, Take 1 tablet by mouth daily., Disp: , Rfl:    calcitRIOL (ROCALTROL) 0.25 MCG capsule, Take 0.25 mcg daily by mouth. , Disp: , Rfl:     Cholecalciferol (VITAMIN D3) 2000 units capsule, Take 2,000 Units by mouth daily., Disp: , Rfl:    cyanocobalamin 500 MCG tablet, Take 500 mcg by mouth daily., Disp: , Rfl:    diltiazem (CARDIZEM CD) 120 MG 24 hr capsule, Take 120 mg by mouth daily., Disp: , Rfl:    doxazosin (CARDURA) 1 MG tablet, Take 1 tablet (1 mg total) by mouth daily., Disp: 90 tablet, Rfl: 2   famotidine (PEPCID) 20 MG tablet, TAKE 1 TABLET BY MOUTH TWICE DAILY AS NEEDED FOR HEARTBURN OR INDIGESTION, Disp: 180 tablet, Rfl: 3   ferrous sulfate 325 (65 FE) MG tablet, Take 325 mg by mouth daily with breakfast. , Disp: , Rfl:    FLUoxetine (PROZAC) 20 MG tablet, Take 1 tablet by mouth once daily, Disp: 90 tablet, Rfl: 3   levothyroxine (SYNTHROID) 88 MCG tablet, Take 1 tablet (88 mcg total) by mouth daily before breakfast., Disp: 90 tablet, Rfl: 3   losartan (COZAAR) 25 MG tablet, Take 1 tablet by mouth daily., Disp: , Rfl:    methylphenidate (RITALIN) 5 MG tablet, Take 5 mg po every morning and may  take 5 mg po as needed in afternoon, Disp: 120 tablet, Rfl: 0   mirtazapine (REMERON) 15 MG tablet, Take 1 tablet (15 mg total) by mouth at bedtime., Disp: 90 tablet, Rfl: 3   rosuvastatin (CRESTOR) 10 MG tablet, Take 1 tablet (10 mg total) by mouth daily., Disp: 90 tablet, Rfl: 3  Patient Active Problem List   Diagnosis Date Noted   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, with agitation 07/19/2022   Insomnia 07/19/2022   Long term current use of anticoagulant 07/19/2022   Attention deficit disorder (ADD) without hyperactivity 07/19/2022   Thrombocytopenia 12/27/2020   Hypomagnesemia 11/10/2020   Valvular heart disease 11/10/2020   Hemochromatosis, hereditary 07/21/2020   Episode of recurrent major depressive disorder 06/11/2019   Aortic ectasia, abdominal 05/22/2018   Hyperparathyroidism, secondary renal 10/02/2017   Controlled substance agreement signed 05/23/2017   On stimulant medication  02/22/2017   Vitamin D deficiency 09/04/2015   Anemia, chronic renal failure 06/09/2015   Chronic kidney disease, stage IV (severe) (HCC)    Essential hypertension    Chronic gout due to renal impairment    Hyperlipidemia    Hypothyroidism following radioiodine therapy    Restrictive lung disease    Atrial fibrillation    History of arterial ischemic stroke 02/21/2014   Left hemiparesis (HCC) 02/11/2014    Past Surgical History:  Procedure Laterality Date   CATARACT EXTRACTION W/PHACO Right 08/27/2016   Procedure: CATARACT EXTRACTION PHACO AND INTRAOCULAR LENS PLACEMENT (IOC);  Surgeon: Nevada Crane, MD;  Location: [facility];  Service: Ophthalmology;  Laterality: Right;  RIGHT   ENTEROSTOMY CLOSURE  11/27/15   ileostomy takedown   EXPLORATORY LAPAROTOMY  11/27/15   illeostomy  07/2014   POLYPECTOMY  10/15   GI surgery to remove polyp    Family History  Problem Relation Age of Onset   Arthritis Mother    Heart disease Mother    Heart attack Father    Heart disease Father    Cirrhosis Sister    Hyperparathyroidism Neg Hx     Social History   Tobacco Use   Smoking status: Never   Smokeless tobacco: Never   Tobacco comments:    smoking cessation materials not required  Vaping Use   Vaping Use: Never used  Substance Use Topics   Alcohol use: No   Drug use: No     Allergies  Allergen Reactions   Bactrim [Sulfamethoxazole-Trimethoprim] Other (See Comments)    Hyperkalemia   Chlorpromazine Other (See Comments)    Hiccups, hallucinations   Cyclobenzaprine Other (See Comments)    Sleeps for 8 hrs after taking it   Oxycodone Other (See Comments)    Excessive sleepiness    Health Maintenance  Topic Date Due   Zoster Vaccines- Shingrix (2 of 2) 01/17/2023   COVID-19 Vaccine (3 - Moderna risk series) 02/09/2023 (Originally 06/23/2020)   INFLUENZA VACCINE  05/15/2023   Medicare Annual Wellness (AWV)  06/28/2023   COLONOSCOPY (Pts 45-76yrs Insurance  coverage will need to be confirmed)  07/07/2024   DTaP/Tdap/Td (2 - Td or Tdap) 01/09/2026   Pneumonia Vaccine 50+ Years old  Completed   Hepatitis C Screening  Completed   HPV VACCINES  Aged Out    Chart Review Today: I personally reviewed active problem list, medication list, allergies, family history, social history, health maintenance, notes from last encounter, lab results, imaging with the patient/caregiver today.   Review of Systems  Constitutional: Negative.   HENT: Negative.  Eyes: Negative.   Respiratory: Negative.    Cardiovascular: Negative.   Gastrointestinal: Negative.   Endocrine: Negative.   Genitourinary: Negative.   Musculoskeletal: Negative.   Skin: Negative.   Allergic/Immunologic: Negative.   Neurological: Negative.   Hematological: Negative.   Psychiatric/Behavioral: Negative.    All other systems reviewed and are negative.    Objective:   Vitals:   01/24/23 1111  BP: 108/68  Pulse: 95  Resp: 16  Temp: 97.8 F (36.6 C)  TempSrc: Oral  SpO2: 99%  Weight: 218 lb 4.8 oz (99 kg)  Height:  (1.93 m)    Body mass index is 26.57 kg/m.  Physical Exam Vitals and nursing note reviewed.  Constitutional:      General: He is not in acute distress.    Appearance: Normal appearance. He is well-developed, well-groomed and overweight. He is not ill-appearing, toxic-appearing or diaphoretic.  HENT:     Head: Normocephalic and atraumatic.     Nose: Nose normal.  Eyes:     General:        Right eye: No discharge.        Left eye: No discharge.     Conjunctiva/sclera: Conjunctivae normal.  Neck:     Trachea: No tracheal deviation.  Cardiovascular:     Rate and Rhythm: Normal rate and regular rhythm.     Pulses: Normal pulses.     Heart sounds: Normal heart sounds. No murmur heard.    No friction rub. No gallop.  Pulmonary:     Effort: Pulmonary effort is normal. No respiratory distress.     Breath sounds: Normal breath sounds. No stridor. No  wheezing, rhonchi or rales.  Abdominal:     General: Bowel sounds are normal.     Palpations: Abdomen is soft.  Musculoskeletal:     Right lower leg: No edema.     Left lower leg: No edema.  Skin:    General: Skin is warm and dry.     Findings: No bruising, erythema or rash.  Neurological:     Mental Status: He is alert. Mental status is at baseline.     Motor: No abnormal muscle tone.     Coordination: Coordination normal.  Psychiatric:        Attention and Perception: He is inattentive.        Mood and Affect: Mood normal. Mood is not anxious or depressed.        Speech: Speech is not delayed.        Behavior: Behavior is slowed. Behavior is cooperative.         Assessment & Plan:   Problem List Items Addressed This Visit       Cardiovascular and Mediastinum   Essential hypertension - Primary    Stable, BP at goal today - no changes to management- On losartan for renal protection and cardizem for afib BP and HR both at goal today, labs reviewed from nephrology  BP Readings from Last 3 Encounters:  01/24/23 108/68  07/19/22 116/72  06/27/22 128/80        Relevant Medications   doxazosin (CARDURA) 1 MG tablet   Other Relevant Orders   COMPLETE METABOLIC PANEL WITH GFR (Completed)   Atrial fibrillation    Well controlled, currently cardiac exam RRR On cardizem, currently RRR in office, asx, long term anticoagulation per cardiology, no bleeding sign or sx, reviewed renal function and CBC  No palpitations, sx or concerns      Relevant Medications  doxazosin (CARDURA) 1 MG tablet   Aortic ectasia, abdominal   Relevant Medications   doxazosin (CARDURA) 1 MG tablet     Endocrine   Hypothyroidism following radioiodine therapy (Chronic)    Due for labs today, no changes with mood, weight, energy level, bowels, hair or skin Will recheck TSH and adjust med dose if needed      Relevant Medications   levothyroxine (SYNTHROID) 88 MCG tablet   Other Relevant  Orders   TSH (Completed)   Hyperparathyroidism, secondary renal    Stable, no recent changes - Per nephrology, last labs reviewed on calcitriol, vit d, allopurinol  Reviewed last OV and labs      Relevant Orders   COMPLETE METABOLIC PANEL WITH GFR (Completed)     Nervous and Auditory   Left hemiparesis (HCC)    No change to baseline mobility/cognitive/attention/personality/moods, continue same meds      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, with agitation    Mood and behavior stable on prozac and ritalin - both pt and wife report doing well, meds refilled Last OV pt was having outbursts and we discussed further eval/consults       Relevant Medications   methylphenidate (RITALIN) 5 MG tablet   mirtazapine (REMERON) 15 MG tablet   methylphenidate (RITALIN) 5 MG tablet (Start on 04/25/2023)     Genitourinary   Chronic kidney disease, stage IV (severe) (HCC) (Chronic)     Hematopoietic and Hemostatic   Thrombocytopenia     Other   Hyperlipidemia    Good statin compliance, has been on Crestor 10 mg daily Due for labs, no SE or concerns      Relevant Medications   doxazosin (CARDURA) 1 MG tablet   Other Relevant Orders   COMPLETE METABOLIC PANEL WITH GFR (Completed)   Lipid panel (Completed)   On stimulant medication    Stable, meds refilled after reviewing controlled substance database and reviewing effectiveness and pt and wife deny any concerning SE      Episode of recurrent major depressive disorder   Relevant Medications   mirtazapine (REMERON) 15 MG tablet   Insomnia   Relevant Medications   mirtazapine (REMERON) 15 MG tablet   Attention deficit disorder (ADD) without hyperactivity   Relevant Medications   methylphenidate (RITALIN) 5 MG tablet   methylphenidate (RITALIN) 5 MG tablet (Start on 04/25/2023)     Return in about 6 months (around 07/26/2023) for Routine follow-up (can do virtual appt).   Danelle Berry, PA-C 01/24/23  11:23 AM

## 2023-01-25 LAB — COMPLETE METABOLIC PANEL WITH GFR
AG Ratio: 1.4 (calc) (ref 1.0–2.5)
ALT: 21 U/L (ref 9–46)
AST: 24 U/L (ref 10–35)
Albumin: 4 g/dL (ref 3.6–5.1)
Alkaline phosphatase (APISO): 84 U/L (ref 35–144)
BUN/Creatinine Ratio: 12 (calc) (ref 6–22)
BUN: 30 mg/dL — ABNORMAL HIGH (ref 7–25)
CO2: 25 mmol/L (ref 20–32)
Calcium: 9.5 mg/dL (ref 8.6–10.3)
Chloride: 108 mmol/L (ref 98–110)
Creat: 2.53 mg/dL — ABNORMAL HIGH (ref 0.70–1.28)
Globulin: 2.8 g/dL (calc) (ref 1.9–3.7)
Glucose, Bld: 79 mg/dL (ref 65–99)
Potassium: 5 mmol/L (ref 3.5–5.3)
Sodium: 141 mmol/L (ref 135–146)
Total Bilirubin: 0.5 mg/dL (ref 0.2–1.2)
Total Protein: 6.8 g/dL (ref 6.1–8.1)
eGFR: 26 mL/min/{1.73_m2} — ABNORMAL LOW (ref >=60)

## 2023-01-25 LAB — LIPID PANEL
Cholesterol: 137 mg/dL (ref <200)
HDL: 46 mg/dL (ref >=40)
LDL Cholesterol (Calc): 73 mg/dL (calc)
Non-HDL Cholesterol (Calc): 91 mg/dL (calc) (ref <130)
Total CHOL/HDL Ratio: 3 (calc) (ref <5.0)
Triglycerides: 95 mg/dL (ref <150)

## 2023-01-25 LAB — TSH: TSH: 2.6 mIU/L (ref 0.40–4.50)

## 2023-01-30 NOTE — Assessment & Plan Note (Signed)
Mood and behavior stable on prozac and ritalin - both pt and wife report doing well, meds refilled Last OV pt was having outbursts and we discussed further eval/consults

## 2023-01-30 NOTE — Assessment & Plan Note (Signed)
Due for labs today, no changes with mood, weight, energy level, bowels, hair or skin Will recheck TSH and adjust med dose if needed

## 2023-01-30 NOTE — Assessment & Plan Note (Signed)
Stable, meds refilled after reviewing controlled substance database and reviewing effectiveness and pt and wife deny any concerning SE

## 2023-01-30 NOTE — Assessment & Plan Note (Signed)
Stable, BP at goal today - no changes to management- On losartan for renal protection and cardizem for afib BP and HR both at goal today, labs reviewed from nephrology  BP Readings from Last 3 Encounters:  01/24/23 108/68  07/19/22 116/72  06/27/22 128/80

## 2023-01-30 NOTE — Assessment & Plan Note (Signed)
Well controlled, currently cardiac exam RRR On cardizem, currently RRR in office, asx, long term anticoagulation per cardiology, no bleeding sign or sx, reviewed renal function and CBC  No palpitations, sx or concerns

## 2023-01-30 NOTE — Assessment & Plan Note (Signed)
Stable, no recent changes - Per nephrology, last labs reviewed on calcitriol, vit d, allopurinol  Reviewed last OV and labs

## 2023-01-30 NOTE — Assessment & Plan Note (Signed)
Good statin compliance, has been on Crestor 10 mg daily Due for labs, no SE or concerns

## 2023-01-30 NOTE — Assessment & Plan Note (Signed)
No change to baseline mobility/cognitive/attention/personality/moods, continue same meds

## 2023-01-31 DIAGNOSIS — I77811 Abdominal aortic ectasia: Secondary | ICD-10-CM | POA: Diagnosis not present

## 2023-01-31 DIAGNOSIS — Z8673 Personal history of transient ischemic attack (TIA), and cerebral infarction without residual deficits: Secondary | ICD-10-CM | POA: Diagnosis not present

## 2023-01-31 DIAGNOSIS — I38 Endocarditis, valve unspecified: Secondary | ICD-10-CM | POA: Diagnosis not present

## 2023-01-31 DIAGNOSIS — E782 Mixed hyperlipidemia: Secondary | ICD-10-CM | POA: Diagnosis not present

## 2023-01-31 DIAGNOSIS — I10 Essential (primary) hypertension: Secondary | ICD-10-CM | POA: Diagnosis not present

## 2023-01-31 DIAGNOSIS — I480 Paroxysmal atrial fibrillation: Secondary | ICD-10-CM | POA: Diagnosis not present

## 2023-01-31 DIAGNOSIS — N184 Chronic kidney disease, stage 4 (severe): Secondary | ICD-10-CM | POA: Diagnosis not present

## 2023-02-11 ENCOUNTER — Telehealth: Payer: Self-pay | Admitting: Family Medicine

## 2023-02-11 NOTE — Telephone Encounter (Signed)
Contacted [employer] to schedule their annual wellness visit. Call back at later date: 02/12/2023  [facility] Care Guide [facility] AWV TEAM Direct Dial: [phone]

## 2023-02-12 DIAGNOSIS — D631 Anemia in chronic kidney disease: Secondary | ICD-10-CM | POA: Diagnosis not present

## 2023-02-12 DIAGNOSIS — N2581 Secondary hyperparathyroidism of renal origin: Secondary | ICD-10-CM | POA: Diagnosis not present

## 2023-02-12 DIAGNOSIS — I10 Essential (primary) hypertension: Secondary | ICD-10-CM | POA: Diagnosis not present

## 2023-02-12 DIAGNOSIS — N184 Chronic kidney disease, stage 4 (severe): Secondary | ICD-10-CM | POA: Diagnosis not present

## 2023-02-18 DIAGNOSIS — I38 Endocarditis, valve unspecified: Secondary | ICD-10-CM | POA: Diagnosis not present

## 2023-02-18 DIAGNOSIS — I480 Paroxysmal atrial fibrillation: Secondary | ICD-10-CM | POA: Diagnosis not present

## 2023-02-21 DIAGNOSIS — H40003 Preglaucoma, unspecified, bilateral: Secondary | ICD-10-CM | POA: Diagnosis not present

## 2023-02-21 DIAGNOSIS — H2512 Age-related nuclear cataract, left eye: Secondary | ICD-10-CM | POA: Diagnosis not present

## 2023-02-21 DIAGNOSIS — Z961 Presence of intraocular lens: Secondary | ICD-10-CM | POA: Diagnosis not present

## 2023-02-28 DIAGNOSIS — I4819 Other persistent atrial fibrillation: Secondary | ICD-10-CM | POA: Diagnosis not present

## 2023-02-28 DIAGNOSIS — N184 Chronic kidney disease, stage 4 (severe): Secondary | ICD-10-CM | POA: Diagnosis not present

## 2023-02-28 DIAGNOSIS — E782 Mixed hyperlipidemia: Secondary | ICD-10-CM | POA: Diagnosis not present

## 2023-02-28 DIAGNOSIS — I77811 Abdominal aortic ectasia: Secondary | ICD-10-CM | POA: Diagnosis not present

## 2023-02-28 DIAGNOSIS — I10 Essential (primary) hypertension: Secondary | ICD-10-CM | POA: Diagnosis not present

## 2023-02-28 DIAGNOSIS — I340 Nonrheumatic mitral (valve) insufficiency: Secondary | ICD-10-CM | POA: Diagnosis not present

## 2023-03-17 ENCOUNTER — Encounter: Payer: Self-pay | Admitting: Anesthesiology

## 2023-03-18 DIAGNOSIS — H2512 Age-related nuclear cataract, left eye: Secondary | ICD-10-CM | POA: Diagnosis not present

## 2023-03-25 ENCOUNTER — Encounter: Payer: Self-pay | Admitting: Ophthalmology

## 2023-03-26 ENCOUNTER — Encounter: Payer: Self-pay | Admitting: Ophthalmology

## 2023-03-26 NOTE — Anesthesia Preprocedure Evaluation (Deleted)
Anesthesia Evaluation    Airway        Dental   Pulmonary           Cardiovascular hypertension,   05-07-24DOPPLER ECHO and OTHER SPECIAL PROCEDURES Aortic: MODERATE AR No AS 123.0 cm/sec peak vel 6.1 mmHg peak grad 3.0 mmHg mean grad 3.1 cm^2 by DOPPLER Mitral: SEVERE MR No MS 526.0 cm/sec peak vel 110.7 mmHg peak grad 76.5 mmHg mean grad MV Inflow E Vel = 107.0 cm/sec MV Annulus E'Vel = 9.0 cm/sec E/E'Ratio = 11.9 Tricuspid: MILD TR No TS 232.0 cm/sec peak TR vel Pulmonary: MILD PR No PS 65.6 cm/sec peak vel 1.7 mmHg peak grad _________________________________________________________________________________________ INTERPRETATION NORMAL LEFT VENTRICULAR SYSTOLIC FUNCTION WITH MILD LVH NORMAL RIGHT VENTRICULAR SYSTOLIC FUNCTION SEVERE VALVULAR REGURGITATION (See above) NO VALVULAR STENOSIS ESTIMATED LVEF >55% Aortic: MODERATE-SEVERE AI Mitral: SEVERE MR; SEVERE LAE Tricuspid: MILD-MODERATE TR Pulmonic: MILD PI SEVERE LAE MODERATE RAE      Neuro/Psych    GI/Hepatic   Endo/Other    Renal/GU      Musculoskeletal   Abdominal   Peds  Hematology   Anesthesia Other Findings Hypertension  Gout CKD (chronic kidney disease) Hyperlipidemia  Hypothyroidism Graves disease  H/O hyperkalemia UTI (urinary tract infection) due to Enterococcus Restrictive lung disease A-fib (HCC)  Cerebellar infarct (HCC) Left hemiparesis (HCC)  Vitamin D deficiency disease Stroke (HCC)  GERD (gastroesophageal reflux disease) Poor short term memory Hyperparathyroidism, secondary renal (HCC) Aortic ectasia, abdominal (HCC)  Atrial fibrillation with RVR (HCC) Hypomagnesemia  Chronic low back pain without sciatica Colon polyp  Mitral regurgitation    Reproductive/Obstetrics                              Anesthesia Physical Anesthesia Plan  ASA: 3  Anesthesia Plan:    Post-op Pain Management:     Induction:   PONV Risk Score and Plan:   Airway Management Planned:   Additional Equipment:   Intra-op Plan:   Post-operative Plan:   Informed Consent:   Plan Discussed with:   Anesthesia Plan Comments:          Anesthesia Quick Evaluation

## 2023-03-31 ENCOUNTER — Ambulatory Visit: Admission: RE | Admit: 2023-03-31 | Payer: Medicare HMO | Source: Home / Self Care | Admitting: Ophthalmology

## 2023-03-31 HISTORY — DX: Nonrheumatic aortic (valve) insufficiency: I35.1

## 2023-03-31 HISTORY — DX: Nonrheumatic mitral (valve) insufficiency: I34.0

## 2023-03-31 SURGERY — PHACOEMULSIFICATION, CATARACT, WITH IOL INSERTION
Anesthesia: Topical | Laterality: Left

## 2023-05-01 DIAGNOSIS — Z8673 Personal history of transient ischemic attack (TIA), and cerebral infarction without residual deficits: Secondary | ICD-10-CM | POA: Diagnosis not present

## 2023-05-01 DIAGNOSIS — E782 Mixed hyperlipidemia: Secondary | ICD-10-CM | POA: Diagnosis not present

## 2023-05-01 DIAGNOSIS — I340 Nonrheumatic mitral (valve) insufficiency: Secondary | ICD-10-CM | POA: Diagnosis not present

## 2023-05-01 DIAGNOSIS — I10 Essential (primary) hypertension: Secondary | ICD-10-CM | POA: Diagnosis not present

## 2023-05-01 DIAGNOSIS — I4819 Other persistent atrial fibrillation: Secondary | ICD-10-CM | POA: Diagnosis not present

## 2023-05-01 DIAGNOSIS — N184 Chronic kidney disease, stage 4 (severe): Secondary | ICD-10-CM | POA: Diagnosis not present

## 2023-05-05 NOTE — H&P (Signed)
Established Patient Visit    Chief Complaint: No chief complaint on file.   Date of Service: 05/01/2023 Date of Birth: July 27, 1951 PCP: [facility]    History of Present Illness: Duane [name] is a 72 y.o.male who presents to clinic today for follow-up. He has a past medical history of atrial fibrillation (on Eliquis, diltiazem), moderate mitral regurgitation (echocardiogram from 01/2020), embolic CVA in 2015, hypertension, hyperlipidemia, chronic kidney disease stage 4 (followed by nephrology). He was previously followed by Dr. Lady Gary. Patient last seen in our office by myself on 5/17 at which time results of his recent echo were discussed that demonstrates severe MR. Additional evaluation with TEE was discussed with patient and he decided to wait until after he had cataract surgery in June. There were scheduling issues with his cataract surgery and this has been postponed.    He reports to clinic today stating he has been doing well overall since his last appointment.  Did not have cataract surgery and is waiting to reschedule this until after he has TEE done.  He states that he continues to notice some exertional fatigue, denies any significant shortness of breath or leg swelling.  Denies chest pain, palpitations.  He states that he is ready to proceed with TEE to further evaluate his severe mitral regurgitation.  Denies any history of dysphagia, no prior neck surgery, no current dental issues.      Past Medical and Surgical History  Past Medical History:  Past Medical History      Past Medical History:  Diagnosis Date   Atrial fibrillation (CMS/HHS-HCC)     Chronic kidney disease      Stage 4   Colon polyp     CVA (cerebral infarction) (CMS/HHS-HCC)     Gout     Hypercholesteremia     Hypertension     Stroke (CMS/HHS-HCC) 2015    left side weakness, short term memory   Thyroid disease      Graves dz s/p medical therapy 20 years ago   Vitamin D deficiency          Past Surgical  History: He  has a past surgical history that includes creation ileostomy (07/2014); open colon resection  (07/2014); Colonoscopy; exploratory laparotomy (N/A, 11/27/2015); and closure enterostomy large/small intestine (N/A, 11/27/2015).    Medications and Allergies  Current Medications:  Medications Ordered Prior to Encounter        Current Outpatient Medications on File Prior to Visit  Medication Sig Dispense Refill   allopurinoL (ZYLOPRIM) 300 MG tablet Take 1 tablet by mouth once daily 90 tablet 0   apixaban (ELIQUIS) 5 mg tablet Take 1 tablet (5 mg total) by mouth every 12 (twelve) hours 60 tablet 11   calcitRIOL (ROCALTROL) 0.25 MCG capsule Take 0.25 mcg by mouth once daily       cholecalciferol (VITAMIN D3) 2,000 unit capsule Take 2,000 Units by mouth once daily.       cyanocobalamin (VITAMIN B12) 500 MCG tablet Take 1 tablet by mouth once daily       dilTIAZem (CARTIA XT) 120 MG XR capsule Take 1 capsule (120 mg total) by mouth once daily 90 capsule 1   doxazosin (CARDURA) 2 MG tablet Take 1 mg by mouth nightly         famotidine (PEPCID) 20 MG tablet Take 20 mg by mouth 2 (two) times daily.       ferrous sulfate 325 (65 FE) MG tablet Take 325 mg by mouth 3 (three) times a  week       FLUoxetine (PROZAC) 20 MG capsule Take 40 mg by mouth every morning         levothyroxine (SYNTHROID, LEVOTHROID) 88 MCG tablet Take 88 mcg by mouth once daily Take on an empty stomach with a glass of water at least 30-60 minutes before breakfast.         losartan (COZAAR) 25 MG tablet Take 25 mg by mouth once daily       methylphenidate HCl (RITALIN) 5 MG tablet Take 5 mg by mouth 2 (two) times daily.       mirtazapine (REMERON) 15 MG tablet Take 15 mg by mouth nightly.       rosuvastatin (CRESTOR) 10 MG tablet Take 1 tablet (10 mg total) by mouth once daily 90 tablet 3    No current facility-administered medications on file prior to visit.        Allergies: Flexeril [cyclobenzaprine], Oxycodone,  Sulfamethoxazole-trimethoprim, and Thorazine [chlorpromazine]   Social and Family History  Social History:   reports that he has never smoked. He has never used smokeless tobacco. He reports that he does not drink alcohol and does not use drugs.   Family History:  Family History        Family History  Problem Relation Name Age of Onset   Gout Mother       Heart disease Mother       Myocardial Infarction (Heart attack) Father       Anesthesia problems Neg Hx       Malignant hyperthermia Neg Hx            Review of Systems  Review of Systems:  Positive for exertional fatigue Negative for weight gain weight loss, weakness, vision change, hearing loss, cough, congestion, PND, orthopnea, heartburn, nausea, diaphoresis, vomiting, diarrhea, bloody stool, melena, stomach pain, extremity pain, leg weakness, leg cramping, leg blood clots, headache, blackouts, nosebleed, trouble swallowing, mouth pain, urinary frequency, urination at night, muscle weakness, skin lesions, skin rashes, tingling ,ulcers, numbness, anxiety, and/or depression   Physical Examination  Vitals: BP 120/70 (BP Location: Left upper arm, Patient Position: Sitting, BP Cuff Size: Adult)   Pulse 76   Resp 15   Ht 193 cm (6\' 4" )   Wt 96.6 kg (213 lb)   SpO2 97%   BMI 25.93 kg/m  Ht: 193 cm (6\' 4" ) Wt: 96.6 kg (213 lb) BSA: Body surface area is 2.28 meters squared. Body mass index is 25.93 kg/m.   Appearance: Well appearing, no acute distress. HEENT: Pupils equally reactive to light and accomodation, no apparent xanthelasma or other lesions. Neck: Supple without apparent masses or lymphadenopathy. Lungs: Normal respiratory effort. No wheezes, rales, or rhonchi. Heart: Irregularly irregular. Normal S1, S2.  Holosystolic murmur. No gallops, or rub. PMI normal size and placement. Carotid upstroke normal without bruit. Jugular venous pressure is normal.  Abdomen: Soft, nontender, non-distended, with normal bowel sounds.  Abdominal aorta is normal size without bruit. No apparent masses. Extremities: No edema, no cyanosis, no clubbing, no ulcers. Peripheral Pulses: 2+ in all extremities, 2+ femoral pulses bilaterally, 2+ DP pulses. Neurological: Alert and oriented to time, place, and person.     Diagnostics    Echo 02/18/2023:  ECHOCARDIOGRAPHIC DESCRIPTIONS AORTIC ROOT                  Size: Normal            Dissection: INDETERM FOR DISSECTION AORTIC VALVE  Leaflets: Tricuspid                   Morphology: MILDLY THICKENED              Mobility: Fully mobile LEFT VENTRICLE                  Size: Normal                        Anterior: Normal           Contraction: Normal                         Lateral: Normal            Closest EF: >55% (Estimated)                Septal: Normal             LV Masses: No Masses                       Apical: Normal                   LVH: MILD LVH CONCENTRIC           Inferior: Normal                                                     Posterior: Normal          Dias.FxClass: Normal               LV Note: SIGMOID SEPTUM MITRAL VALVE              Leaflets: Normal                        Mobility: Fully mobile            Morphology: THICKENED LEAFLET(S)               MV Note: ANNULAR CALCIFICATION LEFT ATRIUM                  Size: SEVERELY ENLARGED            LA Masses: No masses             IA Septum: Normal IAS MAIN PA                  Size: Normal PULMONIC VALVE            Morphology: Normal                        Mobility: Fully mobile RIGHT VENTRICLE             RV Masses: No Masses                         Size: Normal             Free Wall: Normal                     Contraction: Normal  TAPSE = 2.3 cm Normal Range [>= 1.6cm] TRICUSPID VALVE              Leaflets: Normal                        Mobility: Fully mobile            Morphology: Normal RIGHT ATRIUM                  Size: MODERATELY ENLARGED           RA Other: None                RA Mass: No masses PERICARDIUM                 Fluid: No effusion INFERIOR VENACAVA                  Size: Not seen _________________________________________________________________________________________   DOPPLER ECHO and OTHER SPECIAL PROCEDURES                Aortic: MODERATE AR                No AS                        123.0 cm/sec peak vel      6.1 mmHg peak grad                        3.0 mmHg mean grad         3.1 cm^2 by DOPPLER                Mitral: SEVERE MR                  No MS                        526.0 cm/sec peak vel      110.7 mmHg peak grad                        76.5 mmHg mean grad                        MV Inflow E Vel = 107.0 cm/sec      MV Annulus E'Vel = 9.0 cm/sec                        E/E'Ratio = 11.9             Tricuspid: MILD TR                    No TS                        232.0 cm/sec peak TR vel             Pulmonary: MILD PR                    No PS                        65.6 cm/sec peak vel       1.7 mmHg peak grad _________________________________________________________________________________________ INTERPRETATION NORMAL LEFT VENTRICULAR SYSTOLIC FUNCTION   WITH MILD LVH NORMAL RIGHT VENTRICULAR SYSTOLIC FUNCTION  SEVERE VALVULAR REGURGITATION (See above) NO VALVULAR STENOSIS ESTIMATED LVEF >55% Aortic: MODERATE-SEVERE AI Mitral: SEVERE MR; SEVERE LAE Tricuspid: MILD-MODERATE TR Pulmonic: MILD PI SEVERE LAE MODERATE RAE       Assessment    72 y.o. male with      Encounter Diagnoses  Name Primary?   Severe mitral regurgitation Yes   Persistent atrial fibrillation (CMS/HHS-HCC)     Essential hypertension     Mixed hyperlipidemia     History of arterial ischemic stroke; Right ICA occlusion from A Fib embolism - treated with tPA and mechanical throbectomy     CKD (chronic kidney disease) stage 4, GFR 15-29 ml/min (CMS/HHS-HCC)        Plan    Severe Mitral Regurgitation: Echo 02/2023 with severe MR and moderate  to severe AI, normal EF (annular calcification, thickened leaflets, 5.26 m/sec peak vel, 76.5 mmHg mean grad).  -Patient denies SOB, edema. Endorses some fatigue with exertion that has progressively worsened over the last 1-2 years. -TEE discussed in detail today, he will need this done to further evaluate his mitral valve.  Patient amenable to proceeding with TEE with Dr. Melton Alar at [facility].  Risk and benefits discussed in clinic today.   Persistent Atrial Fibrillation: Patient denies palpitations, rate controlled on diltiazem. Rate controlled AF on EKG today -Continue Diltiazem 120 mg daily for rate control.  -Continue Eliquis 5 mg twice daily for stroke prevention, reduced dose of Eliquis not indicated at this time despite Cr 2.73 given patient's age (72 y/o) and weight (97.5 kg). Patient advised to monitor for significant bleeding.   Hypertension: BP controlled in clinic today at 120/70. -Continue Losartan 25 mg daily for blood pressure control.    Hyperlipidemia: Well controlled on lipid panel 01/24/23 with LDL 73 and HDL 46 -Continue Rosuvastatin 10 mg daily for lipid control.    Abdominal Aortic Ectasia: Noted on previous CT abdomen/pelvis from 2017 -Continue statin.    History of CVA: Denies recurrent CVA symptoms. -2015: Right ICA occlusion from embolism treated with tPA and mechanical thrombectomy. Remains on Eliquis.    Chronic Kidney Disease Stage IV: Cr 2.73 and GFR 24 on labs 02/12/2023. -Continue following with Nephrology.         Orders Placed This Encounter  Procedures   ECG 12-lead      Return in about 6 weeks (around 06/12/2023).

## 2023-05-06 ENCOUNTER — Other Ambulatory Visit: Payer: Self-pay

## 2023-05-06 ENCOUNTER — Encounter: Payer: Self-pay | Admitting: Internal Medicine

## 2023-05-06 ENCOUNTER — Ambulatory Visit
Admission: RE | Admit: 2023-05-06 | Discharge: 2023-05-06 | Disposition: A | Discharge status: Home / Self Care | Payer: Medicare HMO | Source: Home / Self Care | Attending: Internal Medicine | Admitting: Internal Medicine

## 2023-05-06 ENCOUNTER — Ambulatory Visit: Payer: Medicare HMO | Admitting: Anesthesiology

## 2023-05-06 ENCOUNTER — Ambulatory Visit (HOSPITAL_BASED_OUTPATIENT_CLINIC_OR_DEPARTMENT_OTHER)
Admission: RE | Admit: 2023-05-06 | Discharge: 2023-05-06 | Disposition: A | Discharge status: Home / Self Care | Payer: Medicare HMO | Source: Home / Self Care | Attending: Internal Medicine | Admitting: Internal Medicine

## 2023-05-06 ENCOUNTER — Encounter
Admission: RE | Disposition: A | Discharge status: Home / Self Care | Payer: Self-pay | Source: Home / Self Care | Attending: Internal Medicine

## 2023-05-06 DIAGNOSIS — E782 Mixed hyperlipidemia: Secondary | ICD-10-CM | POA: Insufficient documentation

## 2023-05-06 DIAGNOSIS — I4891 Unspecified atrial fibrillation: Secondary | ICD-10-CM | POA: Diagnosis not present

## 2023-05-06 DIAGNOSIS — Z79899 Other long term (current) drug therapy: Secondary | ICD-10-CM | POA: Insufficient documentation

## 2023-05-06 DIAGNOSIS — I340 Nonrheumatic mitral (valve) insufficiency: Secondary | ICD-10-CM | POA: Insufficient documentation

## 2023-05-06 DIAGNOSIS — I38 Endocarditis, valve unspecified: Secondary | ICD-10-CM

## 2023-05-06 DIAGNOSIS — Z8673 Personal history of transient ischemic attack (TIA), and cerebral infarction without residual deficits: Secondary | ICD-10-CM | POA: Insufficient documentation

## 2023-05-06 DIAGNOSIS — I4819 Other persistent atrial fibrillation: Secondary | ICD-10-CM | POA: Insufficient documentation

## 2023-05-06 DIAGNOSIS — I351 Nonrheumatic aortic (valve) insufficiency: Secondary | ICD-10-CM | POA: Diagnosis not present

## 2023-05-06 DIAGNOSIS — N184 Chronic kidney disease, stage 4 (severe): Secondary | ICD-10-CM | POA: Diagnosis not present

## 2023-05-06 DIAGNOSIS — Z7901 Long term (current) use of anticoagulants: Secondary | ICD-10-CM | POA: Insufficient documentation

## 2023-05-06 DIAGNOSIS — I129 Hypertensive chronic kidney disease with stage 1 through stage 4 chronic kidney disease, or unspecified chronic kidney disease: Secondary | ICD-10-CM | POA: Insufficient documentation

## 2023-05-06 HISTORY — PX: TEE WITHOUT CARDIOVERSION: SHX5443

## 2023-05-06 LAB — ECHO TEE
MV M vel: 5.39 m/s
MV Peak grad: 116.2 mmHg
Radius: 1 cm

## 2023-05-06 SURGERY — ECHOCARDIOGRAM, TRANSESOPHAGEAL
Anesthesia: General

## 2023-05-06 MED ORDER — SODIUM CHLORIDE 0.9 % IV SOLN: INTRAVENOUS | Status: DC

## 2023-05-06 MED ORDER — PROPOFOL 10 MG/ML IV BOLUS
INTRAVENOUS | Status: AC
  Filled 2023-05-06: qty 40

## 2023-05-06 MED ORDER — PROPOFOL 10 MG/ML IV BOLUS
INTRAVENOUS | Status: DC | PRN
Start: 2023-05-06 — End: 2023-05-06
  Administered 2023-05-06 (×5): 40 mg via INTRAVENOUS
  Administered 2023-05-06: 60 mg via INTRAVENOUS
  Administered 2023-05-06: 40 mg via INTRAVENOUS

## 2023-05-06 NOTE — Progress Notes (Signed)
*  PRELIMINARY RESULTS* Echocardiogram Echocardiogram Transesophageal has been performed.  Duane [name] 05/06/2023, 12:55 PM

## 2023-05-06 NOTE — CV Procedure (Signed)
TEE: Under moderate sedation, TEE was performed without complications: LV: Normal. Normal EF. RV: Normal LA: Normal. Left atrial appendage: Normal without thrombus. Normal function. Inter atrial septum is intact without defect. Double contrast study negative for atrial level shunting. No late appearance of bubbles either. RA: Normal  MV: Severe MR. TV: Normal Trace TR AV: Normal. No AI or AS. PV: Normal. Trace PI.  Thoracic and ascending aorta: Normal without significant plaque or atheromatous changes.  Duane [name] Duane Defrain, DO 05/06/23 12:52 PM

## 2023-05-06 NOTE — Transfer of Care (Signed)
Immediate Anesthesia Transfer of Care Note  Patient: Benedetto Coons  Procedure(s) Performed: TRANSESOPHAGEAL ECHOCARDIOGRAM (TEE)  Patient Location:  spu  Anesthesia Type:General  Level of Consciousness: awake, alert , and oriented  Airway & Oxygen Therapy: Patient Spontanous Breathing and Patient connected to nasal cannula oxygen  Post-op Assessment: Report given to RN and Post -op Vital signs reviewed and stable  Post vital signs: Reviewed  Last Vitals:  Vitals Value Taken Time  BP 121/69 05/06/23 1240  Temp    Pulse 66   Resp 18   SpO2 94 % 05/06/23 1240    Last Pain:  Vitals:   05/06/23 1103  TempSrc:   PainSc: 0-No pain         Complications: No notable events documented.

## 2023-05-06 NOTE — Anesthesia Preprocedure Evaluation (Signed)
Anesthesia Evaluation  Patient identified by MRN, date of birth, ID band Patient awake    Reviewed: Allergy & Precautions, H&P , NPO status , Patient's Chart, lab work & pertinent test results, reviewed documented beta blocker date and time   History of Anesthesia Complications Negative for: history of anesthetic complications  Airway Mallampati: I  TM Distance: >3 FB Neck ROM: full    Dental  (+) Dental Advidsory Given, Teeth Intact, Missing, Poor Dentition   Pulmonary neg pulmonary ROS, Continuous Positive Airway Pressure Ventilation    Pulmonary exam normal breath sounds clear to auscultation       Cardiovascular Exercise Tolerance: Good hypertension, (-) angina (-) Past MI and (-) Cardiac Stents + dysrhythmias Atrial Fibrillation (-) Valvular Problems/Murmurs Rhythm:regular Rate:Normal     Neuro/Psych neg Seizures CVA, Residual Symptoms  negative psych ROS   GI/Hepatic Neg liver ROS,GERD  ,,  Endo/Other  neg diabetesHypothyroidism    Renal/GU CRFRenal disease  negative genitourinary   Musculoskeletal   Abdominal   Peds  Hematology negative hematology ROS (+)   Anesthesia Other Findings Past Medical History: No date: A-fib [facility])     Comment:  requiring life-long anticouagulation 05/22/2018: Aortic ectasia, abdominal (HCC)     Comment:  [place] Sept 2018 01/22/2020: Atrial fibrillation with RVR (HCC) No date: Cerebellar infarct (HCC) 08/21/2016: Chronic low back pain without sciatica No date: CKD (chronic kidney disease)     Comment:  creatinine baseline 1.3-1.5 in 2015 (2024 - stage 4) 11/27/2015: Colon polyp No date: GERD (gastroesophageal reflux disease) No date: Gout No date: Graves disease No date: H/O hyperkalemia     Comment:  with Bactrim No date: Hyperlipidemia     Comment:  requiring life long statin therapy 10/02/2017: Hyperparathyroidism, secondary renal (HCC) No date: Hypertension No date:  Hypomagnesemia No date: Hypothyroidism 02/2014: Left hemiparesis (HCC)     Comment:  s/p strke No date: Mild aortic regurgitation No date: Mitral regurgitation     Comment:  severe No date: Moderate mitral regurgitation by prior echocardiogram No date: Poor short term memory No date: Renal insufficiency No date: Restrictive lung disease     Comment:  mild No date: Severe mitral regurgitation by prior echocardiogram 02/20/2014: Stroke [facility])     Comment:  left side hemiparesis, dysphagia 02/2014: UTI (urinary tract infection) due to Enterococcus No date: Vitamin D deficiency disease   Reproductive/Obstetrics negative OB ROS                             Anesthesia Physical Anesthesia Plan  ASA: 3  Anesthesia Plan: General   Post-op Pain Management:    Induction: Intravenous  PONV Risk Score and Plan: 2 and Propofol infusion and TIVA  Airway Management Planned: Natural Airway and Nasal Cannula  Additional Equipment:   Intra-op Plan:   Post-operative Plan:   Informed Consent: I have reviewed the patients History and Physical, chart, labs and discussed the procedure including the risks, benefits and alternatives for the proposed anesthesia with the patient or authorized representative who has indicated his/her understanding and acceptance.     Dental Advisory Given  Plan Discussed with: Anesthesiologist, CRNA and Surgeon  Anesthesia Plan Comments:         Anesthesia Quick Evaluation

## 2023-05-07 ENCOUNTER — Encounter: Payer: Self-pay | Admitting: Internal Medicine

## 2023-05-07 NOTE — Anesthesia Postprocedure Evaluation (Signed)
Anesthesia Post Note  Patient: Duane [name]  Procedure(s) Performed: TRANSESOPHAGEAL ECHOCARDIOGRAM (TEE)  Patient location during evaluation: Specials Recovery Anesthesia Type: General Level of consciousness: awake and alert Pain management: pain level controlled Vital Signs Assessment: post-procedure vital signs reviewed and stable Respiratory status: spontaneous breathing, nonlabored ventilation, respiratory function stable and patient connected to nasal cannula oxygen Cardiovascular status: blood pressure returned to baseline and stable Postop Assessment: no apparent nausea or vomiting Anesthetic complications: no   No notable events documented.   Last Vitals:  Vitals:   05/06/23 1345 05/06/23 1400  BP: 114/72 (!) 121/53  Pulse: (!) 54   Resp: 17   Temp:    SpO2: 93%     Last Pain:  Vitals:   05/06/23 1400  TempSrc:   PainSc: 0-No pain                 Lenard Simmer

## 2023-05-09 ENCOUNTER — Other Ambulatory Visit: Payer: Self-pay | Admitting: Family Medicine

## 2023-05-09 DIAGNOSIS — I69398 Other sequelae of cerebral infarction: Secondary | ICD-10-CM

## 2023-05-09 DIAGNOSIS — F339 Major depressive disorder, recurrent, unspecified: Secondary | ICD-10-CM

## 2023-05-16 DIAGNOSIS — I480 Paroxysmal atrial fibrillation: Secondary | ICD-10-CM | POA: Diagnosis not present

## 2023-05-16 DIAGNOSIS — I10 Essential (primary) hypertension: Secondary | ICD-10-CM | POA: Diagnosis not present

## 2023-05-16 DIAGNOSIS — I4819 Other persistent atrial fibrillation: Secondary | ICD-10-CM | POA: Diagnosis not present

## 2023-05-16 DIAGNOSIS — I340 Nonrheumatic mitral (valve) insufficiency: Secondary | ICD-10-CM | POA: Diagnosis not present

## 2023-05-16 DIAGNOSIS — E782 Mixed hyperlipidemia: Secondary | ICD-10-CM | POA: Diagnosis not present

## 2023-05-20 DIAGNOSIS — I10 Essential (primary) hypertension: Secondary | ICD-10-CM | POA: Diagnosis not present

## 2023-05-20 DIAGNOSIS — N184 Chronic kidney disease, stage 4 (severe): Secondary | ICD-10-CM | POA: Diagnosis not present

## 2023-05-20 DIAGNOSIS — D631 Anemia in chronic kidney disease: Secondary | ICD-10-CM | POA: Diagnosis not present

## 2023-05-20 DIAGNOSIS — N2581 Secondary hyperparathyroidism of renal origin: Secondary | ICD-10-CM | POA: Diagnosis not present

## 2023-06-19 DIAGNOSIS — I4821 Permanent atrial fibrillation: Secondary | ICD-10-CM | POA: Diagnosis not present

## 2023-06-19 DIAGNOSIS — I080 Rheumatic disorders of both mitral and aortic valves: Secondary | ICD-10-CM | POA: Diagnosis not present

## 2023-06-25 ENCOUNTER — Other Ambulatory Visit: Payer: Self-pay | Admitting: Family Medicine

## 2023-06-25 DIAGNOSIS — F988 Other specified behavioral and emotional disorders with onset usually occurring in childhood and adolescence: Secondary | ICD-10-CM

## 2023-06-25 NOTE — Telephone Encounter (Signed)
Medication Refill - Medication:  methylphenidate (RITALIN) 5 MG tablet  *has 4 left   Has the patient contacted their pharmacy? Yes, advised to contact PCP office  Preferred Pharmacy (with phone number or street name):  [facility] [address] [place] - [address]  Phone: [phone] Fax: [phone]  Has the patient been seen for an appointment in the last year OR does the patient have an upcoming appointment? Yes, [facility] Visit scheduled for 9.19.24

## 2023-06-26 NOTE — Telephone Encounter (Signed)
Requested medication (s) are due for refill today: Yes  Requested medication (s) are on the active medication list: Yes  Last refill:  04/25/23  Future visit scheduled: Yes  Notes to clinic:  Unable to refill per protocol, cannot delegate.      Requested Prescriptions  Pending Prescriptions Disp Refills   methylphenidate (RITALIN) 5 MG tablet 120 tablet 0    Sig: Take 5 mg po every morning and may take 5 mg po as needed in afternoon     Not Delegated - Psychiatry:  Stimulants/ADHD Failed - 06/25/2023 12:24 PM      Failed - This refill cannot be delegated      Failed - Urine Drug Screen completed in last 360 days      Passed - Last BP in normal range    BP Readings from Last 1 Encounters:  05/06/23 (!) 121/53         Passed - Last Heart Rate in normal range    Pulse Readings from Last 1 Encounters:  05/06/23 (!) 54         Passed - Valid encounter within last 6 months    Recent Outpatient Visits           5 months ago Essential hypertension   [facility] Danelle Berry, [place]   11 months ago Essential hypertension   [facility] Danelle Berry, PA-C   1 year ago Essential hypertension   [place] [facility] Danelle Berry, PA-C   2 years ago Hypothyroidism following radioiodine therapy   [facility] Danelle Berry, PA-C   2 years ago Essential hypertension   [place] [facility] Danelle Berry, PA-C       Future Appointments             In 1 week  [facility]   In 1 month Danelle Berry, PA-C [facility], [facility]

## 2023-06-30 ENCOUNTER — Telehealth: Payer: Self-pay | Admitting: Family Medicine

## 2023-06-30 MED ORDER — METHYLPHENIDATE HCL 5 MG PO TABS
ORAL_TABLET | ORAL | 0 refills | Status: DC
Start: 2023-06-30 — End: 2024-03-11

## 2023-06-30 NOTE — Telephone Encounter (Signed)
Routed to provider on 06/26/23, still pending.

## 2023-06-30 NOTE — Telephone Encounter (Signed)
Patient called in today to check on status, copied from TE dated 06/15/23.

## 2023-06-30 NOTE — Telephone Encounter (Signed)
Pt is calling to check on the status of medication refill   Medication Refill - Medication: methylphenidate (RITALIN) 5 MG tablet [[id number]]   Has the patient contacted their pharmacy? Yes.   (Agent: If no, request that the patient contact the pharmacy for the refill. If patient does not wish to contact the pharmacy document the reason why and proceed with request.) (Agent: If yes, when and what did the pharmacy advise?)  Preferred Pharmacy (with phone number or street name):  [facility] [address] [place] - [address] Phone: [phone]  Fax: [phone]     Has the patient been seen for an appointment in the last year OR does the patient have an upcoming appointment? Yes.    Agent: Please be advised that RX refills may take up to 3 business days. We ask that you follow-up with your pharmacy.

## 2023-07-03 ENCOUNTER — Ambulatory Visit: Payer: Medicare HMO

## 2023-07-03 VITALS — BP 112/60 | Ht 76.0 in | Wt 215.9 lb

## 2023-07-03 DIAGNOSIS — Z0000 Encounter for general adult medical examination without abnormal findings: Secondary | ICD-10-CM | POA: Diagnosis not present

## 2023-07-03 DIAGNOSIS — Z23 Encounter for immunization: Secondary | ICD-10-CM

## 2023-07-03 NOTE — Progress Notes (Addendum)
Subjective:   Duane [name] is a 72 y.o. male who presents for Medicare Annual/Subsequent preventive examination.  Visit Complete: In person  Patient Medicare AWV questionnaire was completed by the patient on (not done); I have confirmed that all information answered by patient is correct and no changes since this date. Cardiac Risk Factors include: advanced age (>96men, >58 women);dyslipidemia;hypertension;male gender;sedentary lifestyle    Objective:    Today's Vitals   07/03/23 1002  Weight: 215 lb 14.4 oz (97.9 kg)  Height: 6\' 4"  (1.93 m)   Body mass index is 26.28 kg/m.     07/03/2023   10:09 AM 05/06/2023   11:11 AM 06/27/2022   11:19 AM 06/26/2021   11:02 AM 06/22/2020   11:04 AM 01/21/2020    5:17 PM 06/17/2019   10:57 AM  Advanced Directives  Does Patient Have a Medical Advance Directive? No No No No No No No  Would patient like information on creating a medical advance directive?  No - Patient declined No - Patient declined No - Patient declined No - Patient declined No - Patient declined Yes (MAU/Ambulatory/Procedural Areas - Information given)    Current Medications (verified) Outpatient Encounter Medications as of 07/03/2023  Medication Sig   allopurinol (ZYLOPRIM) 300 MG tablet Take 300 mg by mouth daily.   apixaban (ELIQUIS) 5 MG TABS tablet Take 1 tablet by mouth every 12 (twelve) hours.   Ascorbic Acid (VITAMIN C) 1000 MG tablet Take 1 tablet by mouth daily.   calcitRIOL (ROCALTROL) 0.25 MCG capsule Take 0.25 mcg daily by mouth.    Cholecalciferol (VITAMIN D3) 2000 units capsule Take 2,000 Units by mouth daily.   cyanocobalamin 500 MCG tablet Take 500 mcg by mouth daily.   diltiazem (CARDIZEM CD) 120 MG 24 hr capsule Take 120 mg by mouth daily.   doxazosin (CARDURA) 1 MG tablet Take 1 tablet (1 mg total) by mouth daily.   famotidine (PEPCID) 20 MG tablet TAKE 1 TABLET BY MOUTH TWICE DAILY AS NEEDED FOR HEARTBURN OR INDIGESTION   ferrous sulfate 325 (65  FE) MG tablet Take 325 mg by mouth 3 (three) times a week. M-W-F   FLUoxetine (PROZAC) 20 MG tablet Take 1 tablet by mouth once daily   levothyroxine (SYNTHROID) 88 MCG tablet Take 1 tablet (88 mcg total) by mouth daily before breakfast.   losartan (COZAAR) 25 MG tablet Take 1 tablet by mouth daily.   methylphenidate (RITALIN) 5 MG tablet Take 5 mg po every morning and may take 5 mg po as needed in afternoon   methylphenidate (RITALIN) 5 MG tablet Take 5 mg po every morning and may take 5 mg po as needed in afternoon   mirtazapine (REMERON) 15 MG tablet Take 1 tablet (15 mg total) by mouth at bedtime.   rosuvastatin (CRESTOR) 10 MG tablet Take 1 tablet (10 mg total) by mouth daily.   No facility-administered encounter medications on file as of 07/03/2023.    Allergies (verified) Bactrim [sulfamethoxazole-trimethoprim], Chlorpromazine, Cyclobenzaprine, and Oxycodone   History: Past Medical History:  Diagnosis Date   A-fib [facility])    requiring life-long anticouagulation   Aortic ectasia, abdominal (HCC) 05/22/2018   [place] Sept 2018   Atrial fibrillation with RVR (HCC) 01/22/2020   Cerebellar infarct (HCC)    Chronic low back pain without sciatica 08/21/2016   CKD (chronic kidney disease)    creatinine baseline 1.3-1.5 in 2015 (2024 - stage 4)   Colon polyp 11/27/2015   GERD (gastroesophageal reflux disease)  Gout    Graves disease    H/O hyperkalemia    with Bactrim   Hyperlipidemia    requiring life long statin therapy   Hyperparathyroidism, secondary renal (HCC) 10/02/2017   Hypertension    Hypomagnesemia    Hypothyroidism    Left hemiparesis (HCC) 02/2014   s/p strke   Mild aortic regurgitation    Mitral regurgitation    severe   Moderate mitral regurgitation by prior echocardiogram    Poor short term memory    Renal insufficiency    Restrictive lung disease    mild   Severe mitral regurgitation by prior echocardiogram    Stroke (HCC) 02/20/2014   left side  hemiparesis, dysphagia   UTI (urinary tract infection) due to Enterococcus 02/2014   Vitamin D deficiency disease    Past Surgical History:  Procedure Laterality Date   CATARACT EXTRACTION W/PHACO Right 08/27/2016   Procedure: CATARACT EXTRACTION PHACO AND INTRAOCULAR LENS PLACEMENT (IOC);  Surgeon: Nevada Crane, MD;  Location[facility];  Service: Ophthalmology;  Laterality: Right;  RIGHT   ENTEROSTOMY CLOSURE  11/27/15   ileostomy takedown   EXPLORATORY LAPAROTOMY  11/27/15   illeostomy  07/2014   POLYPECTOMY  10/15   GI surgery to remove polyp   TEE WITHOUT CARDIOVERSION N/A 05/06/2023   Procedure: TRANSESOPHAGEAL ECHOCARDIOGRAM (TEE);  Surgeon: Clotilde Dieter, DO;  Location: [facility];  Service: Cardiovascular;  Laterality: N/A;   Family History  Problem Relation Age of Onset   Arthritis Mother    Heart disease Mother    Heart attack Father    Heart disease Father    Cirrhosis Sister    Hyperparathyroidism Neg Hx    Social History   Socioeconomic History   Marital status: Married    Spouse name: Samara Deist   Number of children: 2   Years of education: Not on file   Highest education level: 12th grade  Occupational History   Occupation: Retired  Tobacco Use   Smoking status: Never   Smokeless tobacco: Never   Tobacco comments:    smoking cessation materials not required  Vaping Use   Vaping status: Never Used  Substance and Sexual Activity   Alcohol use: No   Drug use: No   Sexual activity: Not Currently  Other Topics Concern   Not on file  Social History Narrative   Lives with wife at home: kids grown & live in area (daughter). Son lives in [place].    Social Determinants of Health   Financial Resource Strain: Low Risk  (07/03/2023)   Overall Financial Resource Strain (CARDIA)    Difficulty of Paying Living Expenses: Not hard at all  Food Insecurity: No Food Insecurity (07/03/2023)   Hunger Vital Sign    Worried About Running Out of Food in the  Last Year: Never true    Ran Out of Food in the Last Year: Never true  Transportation Needs: No Transportation Needs (07/03/2023)   PRAPARE - Administrator, Civil Service (Medical): No    Lack of Transportation (Non-Medical): No  Physical Activity: Inactive (07/03/2023)   Exercise Vital Sign    Days of Exercise per Week: 0 days    Minutes of Exercise per Session: 0 min  Stress: No Stress Concern Present (07/03/2023)   [employer] of Occupational Health - Occupational Stress Questionnaire    Feeling of Stress : Not at all  Social Connections: Moderately Isolated (07/03/2023)   Social Connection and Isolation Panel [NHANES]  Frequency of Communication with Friends and Family: More than three times a week    Frequency of Social Gatherings with Friends and Family: Three times a week    Attends Religious Services: Never    Active Member of Clubs or Organizations: No    Attends Banker Meetings: Never    Marital Status: Married    Tobacco Counseling Counseling given: Not Answered Tobacco comments: smoking cessation materials not required   Clinical Intake:  Pre-visit preparation completed: No  Pain : No/denies pain     BMI - recorded: 26.28 Nutritional Status: BMI 25 -29 Overweight Nutritional Risks: None Diabetes: No  How often do you need to have someone help you when you read instructions, pamphlets, or other written materials from your doctor or pharmacy?: 1 - Never  Interpreter Needed?: No  Comments: lives with wife Information entered by :: B.Shean Gerding,LPN   Activities of Daily Living    07/03/2023   10:09 AM 05/06/2023   11:02 AM  In your present state of health, do you have any difficulty performing the following activities:  Hearing? 0 0  Vision? 0 0  Difficulty concentrating or making decisions? 1 0  Comment memory   Walking or climbing stairs? 0 1  Dressing or bathing? 0 0  Doing errands, shopping? 0   Preparing Food and  eating ? N   Using the Toilet? N   In the past six months, have you accidently leaked urine? N   Do you have problems with loss of bowel control? N   Managing your Medications? N   Managing your Finances? N   Housekeeping or managing your Housekeeping? N     Patient Care Team: Danelle Berry, PA-C as PCP - General (Family Medicine) Lady Gary Darlin Priestly, MD as Consulting Physician (Cardiology) Mady Haagensen, MD (Internal Medicine)  Indicate any recent Medical Services you may have received from other than [facility] providers in the past year (date may be approximate).     Assessment:   This is a routine wellness examination for [place].  Hearing/Vision screen Hearing Screening - Comments:: Pt says his hearing is good Vision Screening - Comments:: Pt says vision is good Willey Blade   Goals Addressed   None    Depression Screen    07/03/2023   10:07 AM 01/24/2023   11:06 AM 07/19/2022   11:06 AM 06/27/2022   11:31 AM 01/04/2022    9:19 AM 06/26/2021   10:59 AM 05/17/2021   10:24 AM  PHQ 2/9 Scores  PHQ - 2 Score 0 0 0 0 0 0 0  PHQ- 9 Score  0 0  0  0    Fall Risk    07/03/2023   10:04 AM 01/24/2023   11:06 AM 07/19/2022   11:06 AM 06/27/2022   11:20 AM 01/04/2022    9:19 AM  Fall Risk   Falls in the past year? 0 0 0 0 0  Number falls in past yr: 0 0 0  0  Injury with Fall? 0 0 0  0  Risk for fall due to : No Fall Risks No Fall Risks No Fall Risks No Fall Risks No Fall Risks  Follow up Education provided;Falls prevention discussed Falls prevention discussed;Education provided;Falls evaluation completed Falls prevention discussed;Education provided Education provided;Falls prevention discussed;Falls evaluation completed Falls prevention discussed    MEDICARE RISK AT HOME: Medicare Risk at Home Any stairs in or around the home?: Yes If so, are there any without handrails?: Yes Home free of  loose throw rugs in walkways, pet beds, electrical cords, etc?: Yes Adequate lighting in  your home to reduce risk of falls?: Yes Life alert?: No Use of a cane, walker or w/c?: No Grab bars in the bathroom?: Yes Shower chair or bench in shower?: Yes Elevated toilet seat or a handicapped toilet?: Yes  TIMED UP AND GO:  Was the test performed?  Yes  Length of time to ambulate 10 feet: 10 sec Gait steady and fast without use of assistive device    Cognitive Function:        07/03/2023   10:11 AM 06/27/2022   11:20 AM 06/22/2020   11:06 AM 06/17/2019   10:59 AM 03/12/2018    3:33 PM  6CIT Screen  What Year? 0 points 0 points 0 points 0 points 0 points  What month? 0 points 0 points 0 points 0 points 0 points  What time? 0 points 0 points 0 points 0 points 3 points  Count back from 20 0 points 0 points 0 points 0 points 0 points  Months in reverse 0 points 0 points 0 points 0 points 0 points  Repeat phrase 0 points 0 points 0 points 0 points 4 points  Total Score 0 points 0 points 0 points 0 points 7 points    Immunizations Immunization History  Administered Date(s) Administered   Fluad Quad(high Dose 65+) 08/02/2019, 06/22/2020, 06/26/2021, 06/27/2022   Fluad Trivalent(High Dose 65+) 07/03/2023   Influenza, High Dose Seasonal PF 11/21/2016, 09/09/2017, 08/21/2018   Moderna Sars-Covid-2 Vaccination 04/28/2020, 05/26/2020   Pneumococcal Conjugate-13 12/26/2016   Pneumococcal Polysaccharide-23 03/12/2018   Tdap 01/10/2016   Zoster Recombinant(Shingrix) 08/23/2022, 11/22/2022    TDAP status: Up to date  Flu Vaccine status: Completed at today's visit  Pneumococcal vaccine status: Up to date  Covid-19 vaccine status: Completed vaccines  Qualifies for Shingles Vaccine? Yes   Zostavax completed No   Shingrix Completed?: No.    Education has been provided regarding the importance of this vaccine. Patient has been advised to call insurance company to determine out of pocket expense if they have not yet received this vaccine. Advised may also receive vaccine at local  pharmacy or Health Dept. Verbalized acceptance and understanding.  Screening Tests Health Maintenance  Topic Date Due   COVID-19 Vaccine (3 - Moderna risk series) 06/23/2020   Medicare Annual Wellness (AWV)  07/02/2024   Colonoscopy  07/07/2024   DTaP/Tdap/Td (2 - Td or Tdap) 01/09/2026   Pneumonia Vaccine 65+ Years old  Completed   INFLUENZA VACCINE  Completed   Hepatitis C Screening  Completed   Zoster Vaccines- Shingrix  Completed   HPV VACCINES  Aged Out    Health Maintenance  Health Maintenance Due  Topic Date Due   COVID-19 Vaccine (3 - Moderna risk series) 06/23/2020    Colorectal cancer screening: Type of screening: Colonoscopy. Completed yes. Repeat every 5-10 years  Lung Cancer Screening: (Low Dose CT Chest recommended if Age 61-80 years, 20 pack-year currently smoking OR have quit w/in 15years.) does not qualify.   Lung Cancer Screening Referral: no  Additional Screening:  Hepatitis C Screening: does not qualify; Completed yes  Vision Screening: Recommended annual ophthalmology exams for early detection of glaucoma and other disorders of the eye. Is the patient up to date with their annual eye exam?  Yes  Who is the provider or what is the name of the office in which the patient attends annual eye exams? Dr Willey Blade If pt is not  established with a provider, would they like to be referred to a provider to establish care? No .   Dental Screening: Recommended annual dental exams for proper oral hygiene  Diabetic Foot Exam: n/a  Community Resource Referral / Chronic Care Management: CRR required this visit?  No   CCM required this visit?  No     Plan:     I have personally reviewed and noted the following in the patient's chart:   Medical and social history Use of alcohol, tobacco or illicit drugs  Current medications and supplements including opioid prescriptions. Patient is not currently taking opioid prescriptions. Functional ability and  status Nutritional status Physical activity Advanced directives List of other physicians Hospitalizations, surgeries, and ER visits in previous 12 months Vitals Screenings to include cognitive, depression, and falls Referrals and appointments  In addition, I have reviewed and discussed with patient certain preventive protocols, quality metrics, and best practice recommendations. A written personalized care plan for preventive services as well as general preventive health recommendations were provided to patient.     Sue Lush, LPN   2/84/1324   After Visit Summary: (In Person-Printed) AVS printed and given to the patient  Nurse Notes: The patient states he is doing well and has no concerns or questions at this time.

## 2023-07-03 NOTE — Patient Instructions (Signed)
Duane [name] , Thank you for taking time to come for your Medicare Wellness Visit. I appreciate your ongoing commitment to your health goals. Please review the following plan we discussed and let me know if I can assist you in the future.   Referrals/Orders/Follow-Ups/Clinician Recommendations: none  This is a list of the screening recommended for you and due dates:  Health Maintenance  Topic Date Due   COVID-19 Vaccine (3 - Moderna risk series) 06/23/2020   Medicare Annual Wellness Visit  07/02/2024   Colon Cancer Screening  07/07/2024   DTaP/Tdap/Td vaccine (2 - Td or Tdap) 01/09/2026   Pneumonia Vaccine  Completed   Flu Shot  Completed   Hepatitis C Screening  Completed   Zoster (Shingles) Vaccine  Completed   HPV Vaccine  Aged Out    Advanced directives: (Declined) Advance directive discussed with you today. Even though you declined this today, please call our office should you change your mind, and we can give you the proper paperwork for you to fill out.  Next Medicare Annual Wellness Visit scheduled for next year: Yes 07/08/2024 @ 9:35am in person

## 2023-07-11 ENCOUNTER — Other Ambulatory Visit: Payer: Self-pay | Admitting: Family Medicine

## 2023-07-11 DIAGNOSIS — E782 Mixed hyperlipidemia: Secondary | ICD-10-CM

## 2023-07-14 ENCOUNTER — Other Ambulatory Visit: Payer: Self-pay

## 2023-07-14 NOTE — Telephone Encounter (Signed)
Requested Prescriptions  Pending Prescriptions Disp Refills   rosuvastatin (CRESTOR) 10 MG tablet [Pharmacy Med Name: Rosuvastatin Calcium 10 MG Oral Tablet] 90 tablet 0    Sig: Take 1 tablet by mouth once daily     Cardiovascular:  Antilipid - Statins 2 Failed - 07/11/2023  6:28 PM      Failed - Cr in normal range and within 360 days    Creat  Date Value Ref Range Status  01/24/2023 2.53 (H) 0.70 - 1.28 mg/dL Final         Failed - Lipid Panel in normal range within the last 12 months    Cholesterol, Total  Date Value Ref Range Status  06/09/2015 163 100 - 199 mg/dL Final   Cholesterol  Date Value Ref Range Status  01/24/2023 137 <200 mg/dL Final   LDL Cholesterol (Calc)  Date Value Ref Range Status  01/24/2023 73 mg/dL (calc) Final    Comment:    Reference range: <100 . Desirable range <100 mg/dL for primary prevention;   <70 mg/dL for patients with CHD or diabetic patients  with > or = 2 CHD risk factors. Marland Kitchen LDL-C is now calculated using the Martin-Hopkins  calculation, which is a validated novel method providing  better accuracy than the Friedewald equation in the  estimation of LDL-C.  Horald Pollen et al. Lenox Ahr. 9604;540(98): 2061-2068  (http://education.[facility].com/faq/FAQ164)    HDL  Date Value Ref Range Status  01/24/2023 46 > OR = 40 mg/dL Final  11/91/4782 41 >95 mg/dL Final    Comment:    According to ATP-III Guidelines, HDL-C >59 mg/dL is considered a negative risk factor for CHD.    Triglycerides  Date Value Ref Range Status  01/24/2023 95 <150 mg/dL Final         Passed - Patient is not pregnant      Passed - Valid encounter within last 12 months    Recent Outpatient Visits           5 months ago Essential hypertension   [facility] Danelle Berry, PA-C   12 months ago Essential hypertension   [facility] Danelle Berry, PA-C   1 year ago Essential hypertension   [facility] Danelle Berry, PA-C   2 years ago Hypothyroidism following radioiodine therapy   [facility] Danelle Berry, PA-C   2 years ago Essential hypertension   [facility] Danelle Berry, [place]       Future Appointments             In 2 weeks Danelle Berry, PA-C [facility], [facility]

## 2023-07-29 ENCOUNTER — Ambulatory Visit: Payer: Medicare HMO | Admitting: Family Medicine

## 2023-08-01 ENCOUNTER — Other Ambulatory Visit: Payer: Self-pay | Admitting: Family Medicine

## 2023-08-01 DIAGNOSIS — F0631 Mood disorder due to known physiological condition with depressive features: Secondary | ICD-10-CM

## 2023-08-01 DIAGNOSIS — F339 Major depressive disorder, recurrent, unspecified: Secondary | ICD-10-CM

## 2023-08-01 DIAGNOSIS — I69398 Other sequelae of cerebral infarction: Secondary | ICD-10-CM

## 2023-08-01 NOTE — Telephone Encounter (Signed)
Requested Prescriptions  Pending Prescriptions Disp Refills   FLUoxetine (PROZAC) 20 MG tablet [Pharmacy Med Name: FLUoxetine HCl 20 MG Oral Tablet] 90 tablet 0    Sig: Take 1 tablet by mouth once daily     Psychiatry:  Antidepressants - SSRI Passed - 08/01/2023  2:48 PM      Passed - Completed PHQ-2 or PHQ-9 in the last 360 days      Passed - Valid encounter within last 6 months    Recent Outpatient Visits           6 months ago Essential hypertension   [facility] Danelle Berry, PA-C   1 year ago Essential hypertension   [facility] Danelle Berry, PA-C   1 year ago Essential hypertension   [facility] Danelle Berry, PA-C   2 years ago Hypothyroidism following radioiodine therapy   [facility] Danelle Berry, PA-C   2 years ago Essential hypertension   [facility] Danelle Berry, [place]       Future Appointments             In 2 weeks Mecum, Oswaldo Conroy, PA-C [facility], [facility]

## 2023-08-18 ENCOUNTER — Ambulatory Visit: Payer: Medicare HMO | Admitting: Family Medicine

## 2023-08-18 DIAGNOSIS — I10 Essential (primary) hypertension: Secondary | ICD-10-CM | POA: Diagnosis not present

## 2023-08-18 DIAGNOSIS — N184 Chronic kidney disease, stage 4 (severe): Secondary | ICD-10-CM | POA: Diagnosis not present

## 2023-08-18 DIAGNOSIS — N2581 Secondary hyperparathyroidism of renal origin: Secondary | ICD-10-CM | POA: Diagnosis not present

## 2023-08-18 DIAGNOSIS — D631 Anemia in chronic kidney disease: Secondary | ICD-10-CM | POA: Diagnosis not present

## 2023-08-19 ENCOUNTER — Ambulatory Visit: Payer: Medicare HMO | Admitting: Physician Assistant

## 2023-08-21 ENCOUNTER — Ambulatory Visit (INDEPENDENT_AMBULATORY_CARE_PROVIDER_SITE_OTHER): Payer: Medicare HMO | Admitting: Physician Assistant

## 2023-08-21 ENCOUNTER — Encounter: Payer: Self-pay | Admitting: Physician Assistant

## 2023-08-21 VITALS — BP 114/62 | HR 80 | Resp 16 | Ht 76.0 in | Wt 217.2 lb

## 2023-08-21 DIAGNOSIS — I4891 Unspecified atrial fibrillation: Secondary | ICD-10-CM

## 2023-08-21 DIAGNOSIS — K219 Gastro-esophageal reflux disease without esophagitis: Secondary | ICD-10-CM

## 2023-08-21 DIAGNOSIS — N184 Chronic kidney disease, stage 4 (severe): Secondary | ICD-10-CM | POA: Diagnosis not present

## 2023-08-21 DIAGNOSIS — N2581 Secondary hyperparathyroidism of renal origin: Secondary | ICD-10-CM | POA: Diagnosis not present

## 2023-08-21 DIAGNOSIS — E890 Postprocedural hypothyroidism: Secondary | ICD-10-CM

## 2023-08-21 DIAGNOSIS — E782 Mixed hyperlipidemia: Secondary | ICD-10-CM | POA: Diagnosis not present

## 2023-08-21 DIAGNOSIS — I10 Essential (primary) hypertension: Secondary | ICD-10-CM | POA: Diagnosis not present

## 2023-08-21 MED ORDER — FAMOTIDINE 20 MG PO TABS
20.0000 mg | ORAL_TABLET | Freq: Every day | ORAL | 1 refills | Status: DC
Start: 2023-08-21 — End: 2024-02-24

## 2023-08-21 MED ORDER — DOXAZOSIN MESYLATE 1 MG PO TABS: 1.0000 mg | ORAL_TABLET | Freq: Every day | ORAL | 1 refills | Status: DC

## 2023-08-21 NOTE — Assessment & Plan Note (Signed)
Chronic, historic condition Reviewed most recent nephrology notes from 08/18/2023 Most recent EGFR was 28 per nephrology notes Continue per nephrology recommendations Follow-up in 3 months or sooner if concerns arise

## 2023-08-21 NOTE — Assessment & Plan Note (Addendum)
Chronic, historic condition Followed by nephro- reviewed most recent apt notes and labs  Appears well managed for now Continue current regimen Continue collaboration with nephrology

## 2023-08-21 NOTE — Progress Notes (Signed)
Established Patient Office Visit  Name: Duane [name]   MRN: [id number]    DOB: May 27, 1951   Date:08/21/2023  Today's Provider: Jacquelin Hawking, MHS, PA-C Introduced myself to the patient as a PA-C and provided education on APPs in clinical practice.         Subjective  Chief Complaint  Chief Complaint  Patient presents with   Hyperlipidemia   Hypertension   Depression    HPI   HYPERTENSION / HYPERLIPIDEMIA Satisfied with current treatment? yes Duration of hypertension: years BP monitoring frequency: not checking BP range:  BP medication side effects: no Past BP meds: diltiazem and losartan (cozaar) Duration of hyperlipidemia: years Cholesterol medication side effects: no Cholesterol supplements: none Past cholesterol medications: rosuvastatin (crestor) Medication compliance: good compliance Aspirin: no Recent stressors: no Recurrent headaches: no Visual changes: no Palpitations: no Dyspnea: no Chest pain: no Lower extremity edema: no Dizzy/lightheaded: no    Mood  He reports he is doing well  Denies concerns for depressed mood or anxiety He reports his sleep is good, thinks he gets about 10-12 hours per night  Appetite is usually good as well    HYPOTHYROIDISM Thyroid control status:controlled and stable Satisfied with current treatment? 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, with agitation (HCC) 07/19/2022   Insomnia 07/19/2022   Long term current use of anticoagulant 07/19/2022    Attention deficit disorder (ADD) without hyperactivity 07/19/2022   Thrombocytopenia (HCC) 12/27/2020   Hypomagnesemia 11/10/2020   Valvular heart disease 11/10/2020   Hemochromatosis, hereditary (HCC) 07/21/2020   Episode of recurrent major depressive disorder (HCC) 06/11/2019   Aortic ectasia, abdominal (HCC) 05/22/2018   Hyperparathyroidism, secondary renal (HCC) 10/02/2017   Controlled substance agreement signed 05/23/2017   On stimulant medication 02/22/2017   Vitamin D deficiency 09/04/2015   Anemia, chronic renal failure 06/09/2015   Chronic kidney disease, stage IV (severe) (HCC)    Essential hypertension    Chronic gout due to renal impairment    Hyperlipidemia    Hypothyroidism following radioiodine therapy    Restrictive lung disease    Atrial fibrillation (HCC)    History of arterial ischemic stroke 02/21/2014   Left hemiparesis (HCC) 02/11/2014    Past Surgical History:  Procedure Laterality Date   CATARACT EXTRACTION W/PHACO Right 08/27/2016   Procedure: CATARACT EXTRACTION PHACO AND INTRAOCULAR LENS PLACEMENT (IOC);  Surgeon: Nevada Crane, MD;  Location: [facility];  Service: Ophthalmology;  Laterality: Right;  RIGHT   ENTEROSTOMY CLOSURE  11/27/15   ileostomy takedown   EXPLORATORY LAPAROTOMY  11/27/15   illeostomy  07/2014   POLYPECTOMY  10/15   GI surgery to remove polyp   TEE WITHOUT CARDIOVERSION N/A 05/06/2023   Procedure: TRANSESOPHAGEAL ECHOCARDIOGRAM (TEE);  Surgeon: Clotilde Dieter, DO;  Location: [facility];  Service: Cardiovascular;  Laterality: N/A;    Family History  Problem Relation Age of Onset   Arthritis Mother    Heart disease Mother    Heart attack Father    Heart disease Father    Cirrhosis Sister    Hyperparathyroidism Neg  Hx     Social History   Tobacco Use   Smoking status: Never   Smokeless tobacco: Never   Tobacco comments:    smoking cessation materials not required  Substance Use Topics   Alcohol use: No      Current Outpatient Medications:    allopurinol (ZYLOPRIM) 300 MG tablet, Take 300 mg by mouth daily., Disp: , Rfl:    apixaban (ELIQUIS) 5 MG TABS tablet, Take 1 tablet by mouth every 12 (twelve) hours., Disp: , Rfl:    Ascorbic Acid (VITAMIN C) 1000 MG tablet, Take 1 tablet by mouth daily., Disp: , Rfl:    calcitRIOL (ROCALTROL) 0.25 MCG capsule, Take 0.25 mcg daily by mouth. , Disp: , Rfl:    Cholecalciferol (VITAMIN D3) 2000 units capsule, Take 2,000 Units by mouth daily., Disp: , Rfl:    cyanocobalamin 500 MCG tablet, Take 500 mcg by mouth daily., Disp: , Rfl:    diltiazem (CARDIZEM CD) 120 MG 24 hr capsule, Take 120 mg by mouth daily., Disp: , Rfl:    ferrous sulfate 325 (65 FE) MG tablet, Take 325 mg by mouth 3 (three) times a week. M-W-F, Disp: , Rfl:    FLUoxetine (PROZAC) 20 MG tablet, Take 1 tablet by mouth once daily, Disp: 90 tablet, Rfl: 0   levothyroxine (SYNTHROID) 88 MCG tablet, Take 1 tablet (88 mcg total) by mouth daily before breakfast., Disp: 90 tablet, Rfl: 3   losartan (COZAAR) 25 MG tablet, Take 1 tablet by mouth daily., Disp: , Rfl:    methylphenidate (RITALIN) 5 MG tablet, Take 5 mg po every morning and may take 5 mg po as needed in afternoon, Disp: 120 tablet, Rfl: 0   methylphenidate (RITALIN) 5 MG tablet, Take 5 mg po every morning and may take 5 mg po as needed in afternoon, Disp: 120 tablet, Rfl: 0   mirtazapine (REMERON) 15 MG tablet, Take 1 tablet (15 mg total) by mouth at bedtime., Disp: 90 tablet, Rfl: 3   rosuvastatin (CRESTOR) 10 MG tablet, Take 1 tablet by mouth once daily, Disp: 90 tablet, Rfl: 1   doxazosin (CARDURA) 1 MG tablet, Take 1 tablet (1 mg total) by mouth daily., Disp: 90 tablet, Rfl: 1   famotidine (PEPCID) 20 MG tablet, Take 1 tablet (20 mg total) by mouth daily., Disp: 90 tablet, Rfl: 1  Allergies  Allergen Reactions   Bactrim [Sulfamethoxazole-Trimethoprim] Other (See Comments)    Hyperkalemia   Chlorpromazine Other (See Comments)     Hiccups, hallucinations   Cyclobenzaprine Other (See Comments)    Sleeps for 8 hrs after taking it   Oxycodone Other (See Comments)    Excessive sleepiness    I personally reviewed active problem list, medication list, allergies, health maintenance, notes from last encounter, lab results with the patient/caregiver today.   ROS  See HPI for relevant ROS    Objective  Vitals:   08/21/23 0918  BP: 114/62  Pulse: 80  Resp: 16  SpO2: 98%  Weight: 217 lb 3.2 oz (98.5 kg)  Height: 6\' 4"  (1.93 m)    Body mass index is 26.44 kg/m.  Physical Exam Vitals reviewed.  Constitutional:      General: He is awake.     Appearance: Normal appearance. He is well-developed and well-groomed.  HENT:     Head: Normocephalic and atraumatic.  Cardiovascular:     Rate and Rhythm: Normal rate. Rhythm irregularly irregular.     Pulses: Normal pulses.  Radial pulses are 2+ on the right side and 2+ on the left side.     Heart sounds: Normal heart sounds. No murmur heard.    No friction rub. No gallop.  Pulmonary:     Effort: Pulmonary effort is normal.     Breath sounds: Normal breath sounds. No decreased air movement. No decreased breath sounds, wheezing, rhonchi or rales.  Musculoskeletal:     Cervical back: Normal range of motion.     Right lower leg: No edema.     Left lower leg: No edema.  Neurological:     General: No focal deficit present.     Mental Status: He is alert and oriented to person, place, and time. Mental status is at baseline.     GCS: GCS eye subscore is 4. GCS verbal subscore is 5. GCS motor subscore is 6.  Psychiatric:        Attention and Perception: Attention and perception normal.        Mood and Affect: Mood normal. Affect is flat.        Speech: Speech normal.        Behavior: Behavior is withdrawn. Behavior is cooperative.      No results found for this or any previous visit (from the past 2160 hour(s)).   PHQ2/9:    08/21/2023    9:15 AM  07/03/2023   10:07 AM 01/24/2023   11:06 AM 07/19/2022   11:06 AM 06/27/2022   11:31 AM  Depression screen PHQ 2/9  Decreased Interest 0 0 0 0 0  Down, Depressed, Hopeless 0 0 0 0 0  PHQ - 2 Score 0 0 0 0 0  Altered sleeping 0  0 0   Tired, decreased energy 0  0 0   Change in appetite 0  0 0   Feeling bad or failure about yourself  0  0 0   Trouble concentrating 0  0 0   Moving slowly or fidgety/restless 0  0 0   Suicidal thoughts 0  0 0   PHQ-9 Score 0  0 0   Difficult doing work/chores Not difficult at all  Not difficult at all Not difficult at all       Fall Risk:    08/21/2023    9:15 AM 07/03/2023   10:04 AM 01/24/2023   11:06 AM 07/19/2022   11:06 AM 06/27/2022   11:20 AM  Fall Risk   Falls in the past year? 0 0 0 0 0  Number falls in past yr: 0 0 0 0   Injury with Fall? 0 0 0 0   Risk for fall due to : No Fall Risks No Fall Risks No Fall Risks No Fall Risks No Fall Risks  Follow up Falls prevention discussed;Education provided;Falls evaluation completed Education provided;Falls prevention discussed Falls prevention discussed;Education provided;Falls evaluation completed Falls prevention discussed;Education provided Education provided;Falls prevention discussed;Falls evaluation completed      Functional Status Survey: Is the patient deaf or have difficulty hearing?: No Does the patient have difficulty seeing, even when wearing glasses/contacts?: No Does the patient have difficulty concentrating, remembering, or making decisions?: Yes Does the patient have difficulty walking or climbing stairs?: No Does the patient have difficulty dressing or bathing?: No Does the patient have difficulty doing errands alone such as visiting a doctor's office or shopping?: No    Assessment & Plan  Problem List Items Addressed This Visit       Cardiovascular and Mediastinum   Essential  hypertension - Primary    Chronic, historic condition BP overall in goal today He is not checking  blood pressure at home Appears well-managed on current regimen comprised of losartan 25 mg p.o. daily, diltiazem 120 mg p.o. daily Continue current regimen Follow-up in 6 months or sooner if concerns arise      Relevant Medications   doxazosin (CARDURA) 1 MG tablet   Atrial fibrillation (HCC)    Chronic, historic condition Appears well-managed on current regimen of Cardizem 120 mg p.o. daily He is also on anticoagulation with Eliquis 5 mg p.o. daily and appears to be tolerating well Continue current regimen Continue collaboration with cardiology Follow-up in 3 months or sooner if concerns arise      Relevant Medications   doxazosin (CARDURA) 1 MG tablet     Endocrine   Hypothyroidism following radioiodine therapy (Chronic)    Chronic, historic condition Denies changes to mood, weight, energy level, bowels Appears well-managed on current regimen comprised of levothyroxine 88 mcg p.o. daily Most recent TSH was in recommended levels Recheck in about 3 months  Follow-up in 3 months or sooner if concerns arise      Hyperparathyroidism, secondary renal (HCC)    Chronic, historic condition Followed by nephro- reviewed most recent apt notes and labs  Appears well managed for now Continue current regimen Continue collaboration with nephrology        Genitourinary   Chronic kidney disease, stage IV (severe) (HCC) (Chronic)    Chronic, historic condition Reviewed most recent nephrology notes from 08/18/2023 Most recent EGFR was 28 per nephrology notes Continue per nephrology recommendations Follow-up in 3 months or sooner if concerns arise        Other   Hyperlipidemia    Chronic, historic condition Appears well manage on current regimen comprised of Crestor 10 mg p.o. daily Recheck lipid panel in 6 months For now continue current regimen Follow-up in 6 months or sooner if concerns arise       Relevant Medications   doxazosin (CARDURA) 1 MG tablet   Other Visit  Diagnoses     Gastroesophageal reflux disease, unspecified whether esophagitis present       Relevant Medications   famotidine (PEPCID) 20 MG tablet        Return in about 3 months (around 11/21/2023) for HLD, HTN, mood, concentration.   I, Nivedita Mirabella E Duwayne Matters, PA-C, have reviewed all documentation for this visit. The documentation on 08/21/23 for the exam, diagnosis, procedures, and orders are all accurate and complete.   Jacquelin Hawking, MHS, PA-C [facility]

## 2023-08-21 NOTE — Assessment & Plan Note (Signed)
Chronic, historic condition BP overall in goal today He is not checking blood pressure at home Appears well-managed on current regimen comprised of losartan 25 mg p.o. daily, diltiazem 120 mg p.o. daily Continue current regimen Follow-up in 6 months or sooner if concerns arise

## 2023-08-21 NOTE — Assessment & Plan Note (Signed)
Chronic, historic condition Appears well-managed on current regimen of Cardizem 120 mg p.o. daily He is also on anticoagulation with Eliquis 5 mg p.o. daily and appears to be tolerating well Continue current regimen Continue collaboration with cardiology Follow-up in 3 months or sooner if concerns arise

## 2023-08-21 NOTE — Assessment & Plan Note (Signed)
Chronic, historic condition Denies changes to mood, weight, energy level, bowels Appears well-managed on current regimen comprised of levothyroxine 88 mcg p.o. daily Most recent TSH was in recommended levels Recheck in about 3 months  Follow-up in 3 months or sooner if concerns arise

## 2023-08-21 NOTE — Assessment & Plan Note (Signed)
Chronic, historic condition Appears well manage on current regimen comprised of Crestor 10 mg p.o. daily Recheck lipid panel in 6 months For now continue current regimen Follow-up in 6 months or sooner if concerns arise

## 2023-10-30 ENCOUNTER — Other Ambulatory Visit: Payer: Self-pay | Admitting: Family Medicine

## 2023-10-30 DIAGNOSIS — I69398 Other sequelae of cerebral infarction: Secondary | ICD-10-CM

## 2023-10-30 DIAGNOSIS — F339 Major depressive disorder, recurrent, unspecified: Secondary | ICD-10-CM

## 2023-11-04 ENCOUNTER — Other Ambulatory Visit: Payer: Self-pay | Admitting: Family Medicine

## 2023-11-04 DIAGNOSIS — F988 Other specified behavioral and emotional disorders with onset usually occurring in childhood and adolescence: Secondary | ICD-10-CM

## 2023-11-04 NOTE — Telephone Encounter (Signed)
Medication Refill -  Most Recent Primary Care Visit:  Provider: Jacquelin Hawking E  Department: [facility]  Visit Type: OFFICE VISIT  Date: 08/21/2023  Medication: methylphenidate (RITALIN) 5 MG tablet   Has the patient contacted their pharmacy? No (Agent: If no, request that the patient contact the pharmacy for the refill. If patient does not wish to contact the pharmacy document the reason why and proceed with request.) (Agent: If yes, when and what did the pharmacy advise?)  Is this the correct pharmacy for this prescription? Yes If no, delete pharmacy and type the correct one.  This is the patient's preferred pharmacy:  [facility] [address], [place] - [address] [place] [address] Phone: [phone] Fax: [phone]   Has the prescription been filled recently? Yes  Is the patient out of the medication? No 3 days left  Has the patient been seen for an appointment in the last year OR does the patient have an upcoming appointment? Yes  Can we respond through MyChart? Yes  Agent: Please be advised that Rx refills may take up to 3 business days. We ask that you follow-up with your pharmacy.

## 2023-11-04 NOTE — Telephone Encounter (Signed)
Requested medication (s) are due for refill today: yes  Requested medication (s) are on the active medication list: yes  Last refill:  01/24/23  Future visit scheduled: yes  Notes to clinic:  Unable to refill per protocol, cannot delegate.      Requested Prescriptions  Pending Prescriptions Disp Refills   methylphenidate (RITALIN) 5 MG tablet 120 tablet 0    Sig: Take 5 mg po every morning and may take 5 mg po as needed in afternoon     Not Delegated - Psychiatry:  Stimulants/ADHD Failed - 11/04/2023  1:39 PM      Failed - This refill cannot be delegated      Failed - Urine Drug Screen completed in last 360 days      Passed - Last BP in normal range    BP Readings from Last 1 Encounters:  08/21/23 114/62         Passed - Last Heart Rate in normal range    Pulse Readings from Last 1 Encounters:  08/21/23 80         Passed - Valid encounter within last 6 months    Recent Outpatient Visits           2 months ago Essential hypertension   [place] [facility] Mecum, Oswaldo Conroy, PA-C   9 months ago Essential hypertension   [facility] Danelle Berry, PA-C   1 year ago Essential hypertension   [place] [facility] Danelle Berry, PA-C   1 year ago Essential hypertension   [place] [facility] Danelle Berry, PA-C   2 years ago Hypothyroidism following radioiodine therapy   [facility] Danelle Berry, [place]       Future Appointments             In 2 weeks Danelle Berry, PA-C [facility]

## 2023-11-05 MED ORDER — METHYLPHENIDATE HCL 5 MG PO TABS
ORAL_TABLET | ORAL | 0 refills | Status: DC
Start: 2023-11-05 — End: 2023-11-21

## 2023-11-21 ENCOUNTER — Ambulatory Visit: Payer: Medicare Other | Admitting: Family Medicine

## 2023-11-21 ENCOUNTER — Ambulatory Visit: Payer: Medicare HMO | Admitting: Physician Assistant

## 2023-11-21 ENCOUNTER — Encounter: Payer: Self-pay | Admitting: Family Medicine

## 2023-11-21 VITALS — BP 120/74 | HR 73 | Resp 16 | Ht 76.0 in | Wt 214.0 lb

## 2023-11-21 DIAGNOSIS — E890 Postprocedural hypothyroidism: Secondary | ICD-10-CM

## 2023-11-21 DIAGNOSIS — Z760 Encounter for issue of repeat prescription: Secondary | ICD-10-CM

## 2023-11-21 DIAGNOSIS — I69398 Other sequelae of cerebral infarction: Secondary | ICD-10-CM

## 2023-11-21 DIAGNOSIS — E782 Mixed hyperlipidemia: Secondary | ICD-10-CM

## 2023-11-21 DIAGNOSIS — G4700 Insomnia, unspecified: Secondary | ICD-10-CM

## 2023-11-21 DIAGNOSIS — F339 Major depressive disorder, recurrent, unspecified: Secondary | ICD-10-CM

## 2023-11-21 DIAGNOSIS — F0631 Mood disorder due to known physiological condition with depressive features: Secondary | ICD-10-CM

## 2023-11-21 DIAGNOSIS — F988 Other specified behavioral and emotional disorders with onset usually occurring in childhood and adolescence: Secondary | ICD-10-CM | POA: Diagnosis not present

## 2023-11-21 MED ORDER — ROSUVASTATIN CALCIUM 10 MG PO TABS
10.0000 mg | ORAL_TABLET | Freq: Every day | ORAL | 1 refills | Status: DC
Start: 2023-11-21 — End: 2024-06-02

## 2023-11-21 MED ORDER — METHYLPHENIDATE HCL 5 MG PO TABS
ORAL_TABLET | ORAL | 0 refills | Status: DC
Start: 2023-12-06 — End: 2024-01-01

## 2023-11-21 MED ORDER — MIRTAZAPINE 15 MG PO TABS
15.0000 mg | ORAL_TABLET | Freq: Every day | ORAL | 1 refills | Status: DC
Start: 2023-11-21 — End: 2024-06-02

## 2023-11-21 MED ORDER — LEVOTHYROXINE SODIUM 88 MCG PO TABS
88.0000 ug | ORAL_TABLET | Freq: Every day | ORAL | 1 refills | Status: DC
Start: 2023-11-21 — End: 2024-06-02

## 2023-11-21 MED ORDER — FLUOXETINE HCL 20 MG PO TABS
20.0000 mg | ORAL_TABLET | Freq: Every day | ORAL | 3 refills | Status: DC
Start: 2023-11-21 — End: 2024-06-02

## 2023-11-21 NOTE — Assessment & Plan Note (Signed)
 Patient is euthyroid, no recent changes to medications They do not wish to do labs today so we will obtain labs at the next office visit and continue same dose Last TSH reviewed

## 2023-11-21 NOTE — Assessment & Plan Note (Signed)
 memory deficits s/p right MCA stroke 2015 On methylphenidate  once daily, previously up to [place] Controlled substance database reviewed and meds refilled  Does not seem to be having any mood, BP, HR side effects Pt and wife have asked about getting off the medications - they can try to skip doses and see if they notice decreased focus memory attention etc. and let me know if they would like to discontinue the medicine altogether I did send the current refills with a 30-day supply after reviewing PDMP

## 2023-11-21 NOTE — Assessment & Plan Note (Signed)
 Symptoms well controlled Remeron , no change

## 2023-11-21 NOTE — Patient Instructions (Signed)
 Try to pay cash for fluoxetine /prozac  - I would prefer to not change this medicine just because of insurance.  Look at [employer] for prices if not affordable at [facility] but they looked like it was $10  Please let me know about all meds and whether they give you 30 or 90 day supply.  Also let me know if you both have noticed any difference with days with or without ritalin .

## 2023-11-21 NOTE — Assessment & Plan Note (Signed)
 Lipids have been well-controlled on Crestor  5 mg he takes daily without any side effects or concerns, lipids are not currently due again we will defer labs until his next office visit Outside labs reviewed through care everywhere nephrology

## 2023-11-21 NOTE — Progress Notes (Signed)
 Name: Duane [name]   MRN: [id number]    DOB: 07/11/1951   Date:11/21/2023       Progress Note  Chief Complaint  Patient presents with   Medical Management of Chronic Issues    3 months   Hypertension     Subjective:   Duane [name] is a 73 y.o. male, presents to clinic for routine follow up on chronic conditions  Patient was seen about 3 months ago for routine follow-up but was instructed to come back, they also have insurance changes -patient's wife has a letter stating they will no longer cover Prozac  and they will only do 30-day supplies of his methylphenidate   Labs reviewed with nephrology through care everywhere  Patient and his wife ask about discontinuing methylphenidate  it was prescribed for ADHD symptoms status post stroke with neurocognitive energy and focus changes he has taken a lower dose over the years and currently is taking about 5 mg once daily upon waking he cannot tell me if he notices the effect of his medications PDMP reviewed  His moods and behavior have been stable on Prozac  20 mg daily, no recent or concerning changes    11/21/2023    9:22 AM 08/21/2023    9:15 AM 07/03/2023   10:07 AM  Depression screen PHQ 2/9  Decreased Interest 0 0 0  Down, Depressed, Hopeless 0 0 0  PHQ - 2 Score 0 0 0  Altered sleeping 0 0   Tired, decreased energy 0 0   Change in appetite 0 0   Feeling bad or failure about yourself  0 0   Trouble concentrating 0 0   Moving slowly or fidgety/restless 0 0   Suicidal thoughts 0 0   PHQ-9 Score 0 0   Difficult doing work/chores  Not difficult at all    Hypothyroidism: No recent changes to medications Current Symptoms: denies fatigue, weight changes, heat/cold intolerance, bowel/skin changes or CVS symptoms Most recent results are below; we will not be repeating labs today. Lab Results  Component Value Date   TSH 2.60 01/24/2023   HLD managed on crestor  Lab Results  Component Value Date   CHOL 137  01/24/2023   HDL 46 01/24/2023   LDLCALC 73 01/24/2023   TRIG 95 01/24/2023   CHOLHDL 3.0 01/24/2023      Current Outpatient Medications:    allopurinol  (ZYLOPRIM ) 300 MG tablet, Take 300 mg by mouth daily., Disp: , Rfl:    apixaban  (ELIQUIS ) 5 MG TABS tablet, Take 1 tablet by mouth every 12 (twelve) hours., Disp: , Rfl:    Ascorbic Acid  (VITAMIN C) 1000 MG tablet, Take 1 tablet by mouth daily., Disp: , Rfl:    calcitRIOL  (ROCALTROL ) 0.25 MCG capsule, Take 0.25 mcg daily by mouth. , Disp: , Rfl:    Cholecalciferol  (VITAMIN D3) 2000 units capsule, Take 2,000 Units by mouth daily., Disp: , Rfl:    cyanocobalamin  500 MCG tablet, Take 500 mcg by mouth daily., Disp: , Rfl:    diltiazem  (CARDIZEM  CD) 120 MG 24 hr capsule, Take 120 mg by mouth daily., Disp: , Rfl:    doxazosin  (CARDURA ) 1 MG tablet, Take 1 tablet (1 mg total) by mouth daily., Disp: 90 tablet, Rfl: 1   famotidine  (PEPCID ) 20 MG tablet, Take 1 tablet (20 mg total) by mouth daily., Disp: 90 tablet, Rfl: 1   ferrous sulfate  325 (65 FE) MG tablet, Take 325 mg by mouth 3 (three) times a week. M-W-F, Disp: , Rfl:  FLUoxetine  (PROZAC ) 20 MG tablet, Take 1 tablet by mouth once daily, Disp: 90 tablet, Rfl: 0   levothyroxine  (SYNTHROID ) 88 MCG tablet, Take 1 tablet (88 mcg total) by mouth daily before breakfast., Disp: 90 tablet, Rfl: 3   losartan  (COZAAR ) 25 MG tablet, Take 1 tablet by mouth daily., Disp: , Rfl:    methylphenidate  (RITALIN ) 5 MG tablet, Take 5 mg po every morning and may take 5 mg po as needed in afternoon, Disp: 120 tablet, Rfl: 0   methylphenidate  (RITALIN ) 5 MG tablet, Take 5 mg po every morning and may take 5 mg po as needed in afternoon, Disp: 120 tablet, Rfl: 0   mirtazapine  (REMERON ) 15 MG tablet, Take 1 tablet (15 mg total) by mouth at bedtime., Disp: 90 tablet, Rfl: 3   rosuvastatin  (CRESTOR ) 10 MG tablet, Take 1 tablet by mouth once daily, Disp: 90 tablet, Rfl: 1  Patient Active Problem List   Diagnosis Date  Noted   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, with agitation (HCC) 07/19/2022   Insomnia 07/19/2022   Long term current use of anticoagulant 07/19/2022   Attention deficit disorder (ADD) without hyperactivity 07/19/2022   Thrombocytopenia (HCC) 12/27/2020   Hypomagnesemia 11/10/2020   Valvular heart disease 11/10/2020   Hemochromatosis, hereditary (HCC) 07/21/2020   Episode of recurrent major depressive disorder (HCC) 06/11/2019   Aortic ectasia, abdominal (HCC) 05/22/2018   Hyperparathyroidism, secondary renal (HCC) 10/02/2017   Controlled substance agreement signed 05/23/2017   On stimulant medication 02/22/2017   Vitamin D  deficiency 09/04/2015   Anemia, chronic renal failure 06/09/2015   Chronic kidney disease, stage IV (severe) (HCC)    Essential hypertension    Chronic gout due to renal impairment    Hyperlipidemia    Hypothyroidism following radioiodine therapy    Restrictive lung disease    Atrial fibrillation (HCC)    History of arterial ischemic stroke 02/21/2014   Left hemiparesis (HCC) 02/11/2014    Past Surgical History:  Procedure Laterality Date   CATARACT EXTRACTION W/PHACO Right 08/27/2016   Procedure: CATARACT EXTRACTION PHACO AND INTRAOCULAR LENS PLACEMENT (IOC);  Surgeon: Adine Oneil Novak, MD;  Location: [facility];  Service: Ophthalmology;  Laterality: Right;  RIGHT   ENTEROSTOMY CLOSURE  11/27/15   ileostomy takedown   EXPLORATORY LAPAROTOMY  11/27/15   illeostomy  07/2014   POLYPECTOMY  10/15   GI surgery to remove polyp   TEE WITHOUT CARDIOVERSION N/A 05/06/2023   Procedure: TRANSESOPHAGEAL ECHOCARDIOGRAM (TEE);  Surgeon: Dewane Shiner, DO;  Location: [facility];  Service: Cardiovascular;  Laterality: N/A;    Family History  Problem Relation Age of Onset   Arthritis Mother    Heart disease Mother    Heart attack Father    Heart disease Father    Cirrhosis Sister    Hyperparathyroidism Neg Hx     Social  History   Tobacco Use   Smoking status: Never   Smokeless tobacco: Never   Tobacco comments:    smoking cessation materials not required  Vaping Use   Vaping status: Never Used  Substance Use Topics   Alcohol use: No   Drug use: No     Allergies  Allergen Reactions   Bactrim [Sulfamethoxazole-Trimethoprim] Other (See Comments)    Hyperkalemia   Chlorpromazine Other (See Comments)    Hiccups, hallucinations   Cyclobenzaprine Other (See Comments)    Sleeps for 8 hrs after taking it   Oxycodone Other (See Comments)    Excessive sleepiness  Health Maintenance  Topic Date Due   COVID-19 Vaccine (3 - Moderna risk series) 06/23/2020   Medicare Annual Wellness (AWV)  07/02/2024   Colonoscopy  07/07/2024   DTaP/Tdap/Td (2 - Td or Tdap) 01/09/2026   Pneumonia Vaccine 38+ Years old  Completed   INFLUENZA VACCINE  Completed   Hepatitis C Screening  Completed   Zoster Vaccines- Shingrix   Completed   HPV VACCINES  Aged Out    Chart Review Today: I personally reviewed active problem list, medication list, allergies, family history, social history, health maintenance, notes from last encounter, lab results, imaging with the patient/caregiver today.   Review of Systems  Constitutional: Negative.   HENT: Negative.    Eyes: Negative.   Respiratory: Negative.    Cardiovascular: Negative.   Gastrointestinal: Negative.   Endocrine: Negative.   Genitourinary: Negative.   Musculoskeletal: Negative.   Skin: Negative.   Allergic/Immunologic: Negative.   Neurological: Negative.   Hematological: Negative.   Psychiatric/Behavioral: Negative.    All other systems reviewed and are negative.    Objective:   Vitals:   11/21/23 0923  BP: 120/74  Pulse: 73  Resp: 16  SpO2: 98%  Weight: 214 lb (97.1 kg)  Height: 6' 4 (1.93 m)    Body mass index is 26.05 kg/m.  Physical Exam Vitals and nursing note reviewed.  Constitutional:      General: He is not in acute distress.     Appearance: Normal appearance. He is well-developed, well-groomed and overweight. He is not ill-appearing, toxic-appearing or diaphoretic.  HENT:     Head: Normocephalic and atraumatic.     Nose: Nose normal.  Eyes:     General:        Right eye: No discharge.        Left eye: No discharge.     Conjunctiva/sclera: Conjunctivae normal.  Neck:     Trachea: No tracheal deviation.  Cardiovascular:     Rate and Rhythm: Normal rate and regular rhythm.     Pulses: Normal pulses.     Heart sounds: Normal heart sounds. No murmur heard.    No friction rub. No gallop.  Pulmonary:     Effort: Pulmonary effort is normal. No respiratory distress.     Breath sounds: Normal breath sounds. No stridor. No wheezing, rhonchi or rales.  Abdominal:     General: Bowel sounds are normal.     Palpations: Abdomen is soft.  Musculoskeletal:     Right lower leg: No edema.     Left lower leg: No edema.  Skin:    General: Skin is warm and dry.     Findings: No bruising, erythema or rash.  Neurological:     Mental Status: He is alert. Mental status is at baseline.     Motor: No abnormal muscle tone.     Coordination: Coordination normal.  Psychiatric:        Attention and Perception: He is inattentive.        Mood and Affect: Mood normal. Mood is not anxious or depressed.        Speech: Speech is not delayed.        Behavior: Behavior is slowed. Behavior is cooperative.      Functional Status Survey:   Results for orders placed or performed during the hospital encounter of 05/06/23  ECHO TEE   Collection Time: 05/06/23 12:54 PM  Result Value Ref Range   MV M vel 5.39 m/s   MV Peak grad 116.2 mmHg  Radius 1.00 cm   Est EF 55 - 60%       Assessment & Plan:   Medication refill  Patient did a routine office visit 3 months ago and has seen his specialist recently as well.  He did see a float provider and typically we will do 47-month visits and do minimal lab work due to patient preference. I  did review the most recent office visit and outside specialist and most recent lab work His wife today wished to discuss med changes and insurance changes there may be some difference in coverage for some of his medications and some medications may only be filled with 30-day supply  Depression due to old stroke -     FLUoxetine  HCl; Take 1 tablet (20 mg total) by mouth daily.  Dispense: 90 tablet; Refill: 3  Episode of recurrent major depressive disorder, unspecified depression episode severity (HCC) Assessment & Plan: Patient has been stable on fluoxetine  20 mg daily for many years With a change of insurance fluoxetine  is not covered we reviewed cash prices at [employer] and patient's wife will get a 90-day supply at [facility] which appears to currently be $10 encouraged her to not run it through insurance and to use GoodRx Prefer to avoid medication changes just because of insurance Continue prozac  20 mg daily    11/21/2023    9:22 AM 08/21/2023    9:15 AM 07/03/2023   10:07 AM  Depression screen PHQ 2/9  Decreased Interest 0 0 0  Down, Depressed, Hopeless 0 0 0  PHQ - 2 Score 0 0 0  Altered sleeping 0 0   Tired, decreased energy 0 0   Change in appetite 0 0   Feeling bad or failure about yourself  0 0   Trouble concentrating 0 0   Moving slowly or fidgety/restless 0 0   Suicidal thoughts 0 0   PHQ-9 Score 0 0   Difficult doing work/chores  Not difficult at all      Orders: -     FLUoxetine  HCl; Take 1 tablet (20 mg total) by mouth daily.  Dispense: 90 tablet; Refill: 3  Attention deficit disorder (ADD) without hyperactivity Assessment & Plan: memory deficits s/p right MCA stroke 2015 On methylphenidate  once daily, previously up to [place] Controlled substance database reviewed and meds refilled  Does not seem to be having any mood, BP, HR side effects Pt and wife have asked about getting off the medications - they can try to skip doses and see if they notice decreased focus memory  attention etc. and let me know if they would like to discontinue the medicine altogether I did send the current refills with a 30-day supply after reviewing PDMP  Orders: -     Methylphenidate  HCl; Take 5 mg po every morning and may take 5 mg po as needed in afternoon  Dispense: 60 tablet; Refill: 0  Hypothyroidism following radioiodine therapy Assessment & Plan: Patient is euthyroid, no recent changes to medications They do not wish to do labs today so we will obtain labs at the next office visit and continue same dose Last TSH reviewed  Orders: -     Levothyroxine  Sodium; Take 1 tablet (88 mcg total) by mouth daily before breakfast.  Dispense: 90 tablet; Refill: 1  Insomnia, unspecified type Assessment & Plan: Symptoms well controlled Remeron , no change  Orders: -     Mirtazapine ; Take 1 tablet (15 mg total) by mouth at bedtime.  Dispense: 90 tablet; Refill: 1  Mixed hyperlipidemia Assessment & Plan: Lipids have been well-controlled on Crestor  5 mg he takes daily without any side effects or concerns, lipids are not currently due again we will defer labs until his next office visit Outside labs reviewed through care everywhere nephrology  Orders: -     Rosuvastatin  Calcium ; Take 1 tablet (10 mg total) by mouth daily.  Dispense: 90 tablet; Refill: 1     Return in about 6 months (around 05/20/2024) for labs and routine f/up in office.   Michelene Cower, PA-C 11/21/23 9:29 AM

## 2023-11-21 NOTE — Assessment & Plan Note (Signed)
 Patient has been stable on fluoxetine  20 mg daily for many years With a change of insurance fluoxetine  is not covered we reviewed cash prices at [employer] and patient's wife will get a 90-day supply at [facility] which appears to currently be $10 encouraged her to not run it through insurance and to use GoodRx Prefer to avoid medication changes just because of insurance Continue prozac  20 mg daily    11/21/2023    9:22 AM 08/21/2023    9:15 AM 07/03/2023   10:07 AM  Depression screen PHQ 2/9  Decreased Interest 0 0 0  Down, Depressed, Hopeless 0 0 0  PHQ - 2 Score 0 0 0  Altered sleeping 0 0   Tired, decreased energy 0 0   Change in appetite 0 0   Feeling bad or failure about yourself  0 0   Trouble concentrating 0 0   Moving slowly or fidgety/restless 0 0   Suicidal thoughts 0 0   PHQ-9 Score 0 0   Difficult doing work/chores  Not difficult at all

## 2023-12-10 DIAGNOSIS — I340 Nonrheumatic mitral (valve) insufficiency: Secondary | ICD-10-CM | POA: Diagnosis not present

## 2023-12-10 DIAGNOSIS — I482 Chronic atrial fibrillation, unspecified: Secondary | ICD-10-CM | POA: Diagnosis not present

## 2023-12-10 DIAGNOSIS — I10 Essential (primary) hypertension: Secondary | ICD-10-CM | POA: Diagnosis not present

## 2023-12-10 DIAGNOSIS — N184 Chronic kidney disease, stage 4 (severe): Secondary | ICD-10-CM | POA: Diagnosis not present

## 2023-12-25 DIAGNOSIS — N2581 Secondary hyperparathyroidism of renal origin: Secondary | ICD-10-CM | POA: Diagnosis not present

## 2023-12-25 DIAGNOSIS — I10 Essential (primary) hypertension: Secondary | ICD-10-CM | POA: Diagnosis not present

## 2023-12-25 DIAGNOSIS — N184 Chronic kidney disease, stage 4 (severe): Secondary | ICD-10-CM | POA: Diagnosis not present

## 2023-12-25 DIAGNOSIS — D631 Anemia in chronic kidney disease: Secondary | ICD-10-CM | POA: Diagnosis not present

## 2023-12-29 ENCOUNTER — Telehealth: Payer: Self-pay | Admitting: Family Medicine

## 2023-12-29 NOTE — Telephone Encounter (Signed)
 [place] with [employer] calling to advise PA was approved today 12/29/2023 and will be valid for one year.   Patient has been notified and [place] will fax information to office with approval.

## 2023-12-29 NOTE — Telephone Encounter (Signed)
PA submitted waiting for determination

## 2023-12-29 NOTE — Telephone Encounter (Signed)
 Prior Authorization from covermymeds  methylphenidate (RITALIN) 5 MG tablet   Key: [id number]

## 2024-01-01 ENCOUNTER — Other Ambulatory Visit: Payer: Self-pay | Admitting: Family Medicine

## 2024-01-01 DIAGNOSIS — F988 Other specified behavioral and emotional disorders with onset usually occurring in childhood and adolescence: Secondary | ICD-10-CM

## 2024-01-01 NOTE — Telephone Encounter (Signed)
 Requested medication (s) are due for refill today: yes  Requested medication (s) are on the active medication list: yes  Last refill:  06/30/23 #120/0  Future visit scheduled: yes  Notes to clinic:  Unable to refill per protocol, cannot delegate.    Requested Prescriptions  Pending Prescriptions Disp Refills   methylphenidate (RITALIN) 5 MG tablet 60 tablet 0    Sig: Take 5 mg po every morning and may take 5 mg po as needed in afternoon     Not Delegated - Psychiatry:  Stimulants/ADHD Failed - 01/01/2024  4:26 PM      Failed - This refill cannot be delegated      Failed - Urine Drug Screen completed in last 360 days      Passed - Last BP in normal range    BP Readings from Last 1 Encounters:  11/21/23 120/74         Passed - Last Heart Rate in normal range    Pulse Readings from Last 1 Encounters:  11/21/23 73         Passed - Valid encounter within last 6 months    Recent Outpatient Visits           4 months ago Essential hypertension   [place] [facility] Mecum, Oswaldo Conroy, PA-C   11 months ago Essential hypertension   [facility] Danelle Berry, PA-C   1 year ago Essential hypertension   [facility] Danelle Berry, PA-C   1 year ago Essential hypertension   [place] [facility] Danelle Berry, PA-C   2 years ago Hypothyroidism following radioiodine therapy   [facility] Danelle Berry, [place]       Future Appointments             In 4 months Danelle Berry, PA-C [facility], [facility]

## 2024-01-01 NOTE — Telephone Encounter (Signed)
 Copied from CRM [id number]. Topic: Clinical - Medication Refill >> Jan 01, 2024  9:02 AM Benetta Spar B wrote: Most Recent Primary Care Visit:  Provider: Danelle Berry  Department: [facility]  Visit Type: OFFICE VISIT  Date: 11/21/2023  Medication: methylphenidate (RITALIN) 5 MG tablet  Has the patient contacted their pharmacy? yes  (Agent: If yes, when and what did the pharmacy advise?)contact pdp/PA WAS APPROVED TODAY   Is this the correct pharmacy for this prescription? yes  This is the patient's preferred pharmacy:  [facility] [address] [place] - [address] [place] [address] Phone: [phone] Fax: [phone]   Has the prescription been filled recently? yes  Is the patient out of the medication? yes  Has the patient been seen for an appointment in the last year OR does the patient have an upcoming appointment? yes  Can we respond through MyChart? no  Agent: Please be advised that Rx refills may take up to 3 business days. We ask that you follow-up with your pharmacy.

## 2024-01-05 MED ORDER — METHYLPHENIDATE HCL 5 MG PO TABS
ORAL_TABLET | ORAL | 0 refills | Status: DC
Start: 2024-01-05 — End: 2024-06-02

## 2024-01-16 DIAGNOSIS — Z961 Presence of intraocular lens: Secondary | ICD-10-CM | POA: Diagnosis not present

## 2024-01-16 DIAGNOSIS — H2512 Age-related nuclear cataract, left eye: Secondary | ICD-10-CM | POA: Diagnosis not present

## 2024-01-27 DIAGNOSIS — E890 Postprocedural hypothyroidism: Secondary | ICD-10-CM | POA: Diagnosis not present

## 2024-01-27 DIAGNOSIS — I340 Nonrheumatic mitral (valve) insufficiency: Secondary | ICD-10-CM | POA: Diagnosis not present

## 2024-01-27 DIAGNOSIS — D696 Thrombocytopenia, unspecified: Secondary | ICD-10-CM | POA: Diagnosis not present

## 2024-01-27 DIAGNOSIS — I480 Paroxysmal atrial fibrillation: Secondary | ICD-10-CM | POA: Diagnosis not present

## 2024-01-27 DIAGNOSIS — E7800 Pure hypercholesterolemia, unspecified: Secondary | ICD-10-CM | POA: Diagnosis not present

## 2024-01-27 DIAGNOSIS — Z01818 Encounter for other preprocedural examination: Secondary | ICD-10-CM | POA: Diagnosis not present

## 2024-01-27 DIAGNOSIS — N184 Chronic kidney disease, stage 4 (severe): Secondary | ICD-10-CM | POA: Diagnosis not present

## 2024-01-27 DIAGNOSIS — I351 Nonrheumatic aortic (valve) insufficiency: Secondary | ICD-10-CM | POA: Diagnosis not present

## 2024-01-27 DIAGNOSIS — I10 Essential (primary) hypertension: Secondary | ICD-10-CM | POA: Diagnosis not present

## 2024-02-10 DIAGNOSIS — H2589 Other age-related cataract: Secondary | ICD-10-CM | POA: Diagnosis not present

## 2024-02-10 DIAGNOSIS — Z79890 Hormone replacement therapy (postmenopausal): Secondary | ICD-10-CM | POA: Diagnosis not present

## 2024-02-10 DIAGNOSIS — E890 Postprocedural hypothyroidism: Secondary | ICD-10-CM | POA: Diagnosis not present

## 2024-02-10 DIAGNOSIS — E785 Hyperlipidemia, unspecified: Secondary | ICD-10-CM | POA: Diagnosis not present

## 2024-02-10 DIAGNOSIS — D696 Thrombocytopenia, unspecified: Secondary | ICD-10-CM | POA: Diagnosis not present

## 2024-02-10 DIAGNOSIS — Z923 Personal history of irradiation: Secondary | ICD-10-CM | POA: Diagnosis not present

## 2024-02-10 DIAGNOSIS — Z7901 Long term (current) use of anticoagulants: Secondary | ICD-10-CM | POA: Diagnosis not present

## 2024-02-10 DIAGNOSIS — I351 Nonrheumatic aortic (valve) insufficiency: Secondary | ICD-10-CM | POA: Diagnosis not present

## 2024-02-10 DIAGNOSIS — Z885 Allergy status to narcotic agent status: Secondary | ICD-10-CM | POA: Diagnosis not present

## 2024-02-10 DIAGNOSIS — H2512 Age-related nuclear cataract, left eye: Secondary | ICD-10-CM | POA: Diagnosis not present

## 2024-02-10 DIAGNOSIS — I480 Paroxysmal atrial fibrillation: Secondary | ICD-10-CM | POA: Diagnosis not present

## 2024-02-10 DIAGNOSIS — Z79899 Other long term (current) drug therapy: Secondary | ICD-10-CM | POA: Diagnosis not present

## 2024-02-10 DIAGNOSIS — I69354 Hemiplegia and hemiparesis following cerebral infarction affecting left non-dominant side: Secondary | ICD-10-CM | POA: Diagnosis not present

## 2024-02-10 DIAGNOSIS — M109 Gout, unspecified: Secondary | ICD-10-CM | POA: Diagnosis not present

## 2024-02-10 DIAGNOSIS — E0500 Thyrotoxicosis with diffuse goiter without thyrotoxic crisis or storm: Secondary | ICD-10-CM | POA: Diagnosis not present

## 2024-02-10 DIAGNOSIS — E039 Hypothyroidism, unspecified: Secondary | ICD-10-CM | POA: Diagnosis not present

## 2024-02-10 DIAGNOSIS — I4891 Unspecified atrial fibrillation: Secondary | ICD-10-CM | POA: Diagnosis not present

## 2024-02-10 DIAGNOSIS — H269 Unspecified cataract: Secondary | ICD-10-CM | POA: Diagnosis not present

## 2024-02-10 DIAGNOSIS — I340 Nonrheumatic mitral (valve) insufficiency: Secondary | ICD-10-CM | POA: Diagnosis not present

## 2024-02-10 DIAGNOSIS — E7800 Pure hypercholesterolemia, unspecified: Secondary | ICD-10-CM | POA: Diagnosis not present

## 2024-02-10 DIAGNOSIS — N184 Chronic kidney disease, stage 4 (severe): Secondary | ICD-10-CM | POA: Diagnosis not present

## 2024-02-10 DIAGNOSIS — I129 Hypertensive chronic kidney disease with stage 1 through stage 4 chronic kidney disease, or unspecified chronic kidney disease: Secondary | ICD-10-CM | POA: Diagnosis not present

## 2024-02-22 ENCOUNTER — Other Ambulatory Visit: Payer: Self-pay | Admitting: Physician Assistant

## 2024-02-22 DIAGNOSIS — K219 Gastro-esophageal reflux disease without esophagitis: Secondary | ICD-10-CM

## 2024-02-24 NOTE — Telephone Encounter (Signed)
 Requested Prescriptions  Pending Prescriptions Disp Refills   famotidine  (PEPCID ) 20 MG tablet [Pharmacy Med Name: Famotidine  20 MG Oral Tablet] 90 tablet 0    Sig: Take 1 tablet by mouth once daily     Gastroenterology:  H2 Antagonists Passed - 02/24/2024  1:57 PM      Passed - Valid encounter within last 12 months    Recent Outpatient Visits           3 months ago Medication refill   [facility] Adeline Hone, PA-C       Future Appointments             In 2 months Tapia, Leisa, PA-C  [facility], [facility]

## 2024-03-11 ENCOUNTER — Other Ambulatory Visit: Payer: Self-pay | Admitting: Family Medicine

## 2024-03-11 DIAGNOSIS — F988 Other specified behavioral and emotional disorders with onset usually occurring in childhood and adolescence: Secondary | ICD-10-CM

## 2024-03-11 NOTE — Telephone Encounter (Signed)
 Copied from CRM [id number]. Topic: Clinical - Medication Refill >> Mar 11, 2024  9:58 AM Phil Braun wrote: Medication: methylphenidate  (RITALIN ) 5 MG tablet  Has the patient contacted their pharmacy? No   This is the patient's preferred pharmacy:  [facility] [address], [place] - [address] [place] [address] Phone: [phone] Fax: [phone]  Is this the correct pharmacy for this prescription? Yes  Has the prescription been filled recently? Yes  Is the patient out of the medication? Yes  Has the patient been seen for an appointment in the last year OR does the patient have an upcoming appointment? Yes  Can we respond through MyChart? No  Agent: Please be advised that Rx refills may take up to 3 business days. We ask that you follow-up with your pharmacy.

## 2024-03-12 NOTE — Telephone Encounter (Signed)
 Requested medications are due for refill today.  Yes  Requested medications are on the active medications list.  yes  Last refill. 01/05/2024 #60 0 rf  Future visit scheduled.   yes  Notes to clinic.  Refill not delegated.    Requested Prescriptions  Pending Prescriptions Disp Refills   methylphenidate  (RITALIN ) 5 MG tablet 120 tablet 0    Sig: Take 5 mg po every morning and may take 5 mg po as needed in afternoon     Not Delegated - Psychiatry:  Stimulants/ADHD Failed - 03/12/2024  4:19 PM      Failed - This refill cannot be delegated      Failed - Urine Drug Screen completed in last 360 days      Passed - Last BP in normal range    BP Readings from Last 1 Encounters:  11/21/23 120/74         Passed - Last Heart Rate in normal range    Pulse Readings from Last 1 Encounters:  11/21/23 73         Passed - Valid encounter within last 6 months    Recent Outpatient Visits           3 months ago Medication refill   [facility] Adeline Hone, [place]       Future Appointments             In 2 months Adeline Hone, PA-C [facility], [facility]

## 2024-03-15 MED ORDER — METHYLPHENIDATE HCL 5 MG PO TABS
ORAL_TABLET | ORAL | 0 refills | Status: DC
Start: 2024-04-09 — End: 2024-06-02

## 2024-04-01 ENCOUNTER — Other Ambulatory Visit: Payer: Self-pay | Admitting: Family Medicine

## 2024-04-05 NOTE — Telephone Encounter (Signed)
 Requested Prescriptions  Pending Prescriptions Disp Refills   doxazosin  (CARDURA ) 1 MG tablet [Pharmacy Med Name: Doxazosin  Mesylate 1 MG Oral Tablet] 90 tablet 0    Sig: Take 1 tablet by mouth once daily     Cardiovascular:  Alpha Blockers Passed - 04/05/2024  2:26 PM      Passed - Last BP in normal range    BP Readings from Last 1 Encounters:  11/21/23 120/74         Passed - Valid encounter within last 6 months    Recent Outpatient Visits           4 months ago Medication refill   [facility] Leavy Mole, [place]       Future Appointments             In 1 month Leavy Mole, PA-C [facility], [facility]

## 2024-04-29 DIAGNOSIS — N184 Chronic kidney disease, stage 4 (severe): Secondary | ICD-10-CM | POA: Diagnosis not present

## 2024-04-29 DIAGNOSIS — N2581 Secondary hyperparathyroidism of renal origin: Secondary | ICD-10-CM | POA: Diagnosis not present

## 2024-04-29 DIAGNOSIS — I10 Essential (primary) hypertension: Secondary | ICD-10-CM | POA: Diagnosis not present

## 2024-04-29 DIAGNOSIS — D631 Anemia in chronic kidney disease: Secondary | ICD-10-CM | POA: Diagnosis not present

## 2024-05-05 ENCOUNTER — Telehealth: Payer: Self-pay | Admitting: Family Medicine

## 2024-05-05 DIAGNOSIS — F988 Other specified behavioral and emotional disorders with onset usually occurring in childhood and adolescence: Secondary | ICD-10-CM

## 2024-05-05 NOTE — Telephone Encounter (Unsigned)
 Copied from CRM [id number]. Topic: Clinical - Medication Refill >> May 05, 2024 11:20 AM Carlatta H wrote: Medication: methylphenidate  (RITALIN ) 5 MG tablet  Has the patient contacted their pharmacy? No (Agent: If no, request that the patient contact the pharmacy for the refill. If patient does not wish to contact the pharmacy document the reason why and proceed with request.) (Agent: If yes, when and what did the pharmacy advise?)  This is the patient's preferred pharmacy:  [facility] [address] [place] - [address] [place] [address] Phone: [phone] Fax: [phone]  Is this the correct pharmacy for this prescription? Yes If no, delete pharmacy and type the correct one.   Has the prescription been filled recently? No  Is the patient out of the medication? Yes  Has the patient been seen for an appointment in the last year OR does the patient have an upcoming appointment? Yes  Can we respond through MyChart? No  Agent: Please be advised that Rx refills may take up to 3 business days. We ask that you follow-up with your pharmacy.

## 2024-05-07 NOTE — Telephone Encounter (Signed)
 Requested medication (s) are due for refill today: yes  Requested medication (s) are on the active medication list: yes  Last refill:  01/05/24  Future visit scheduled: yes  Notes to clinic:  Unable to refill per protocol, cannot delegate.      Requested Prescriptions  Pending Prescriptions Disp Refills   methylphenidate  (RITALIN ) 5 MG tablet 60 tablet 0    Sig: Take 5 mg po every morning and may take 5 mg po as needed in afternoon     Not Delegated - Psychiatry:  Stimulants/ADHD Failed - 05/07/2024  9:20 AM      Failed - This refill cannot be delegated      Failed - Urine Drug Screen completed in last 360 days      Passed - Last BP in normal range    BP Readings from Last 1 Encounters:  11/21/23 120/74         Passed - Last Heart Rate in normal range    Pulse Readings from Last 1 Encounters:  11/21/23 73         Passed - Valid encounter within last 6 months    Recent Outpatient Visits           5 months ago Medication refill   [facility] Leavy Mole, PA-C

## 2024-05-20 ENCOUNTER — Ambulatory Visit: Payer: Medicare Other | Admitting: Family Medicine

## 2024-05-26 DIAGNOSIS — I340 Nonrheumatic mitral (valve) insufficiency: Secondary | ICD-10-CM | POA: Diagnosis not present

## 2024-05-27 ENCOUNTER — Other Ambulatory Visit: Payer: Self-pay | Admitting: Family Medicine

## 2024-05-27 DIAGNOSIS — K219 Gastro-esophageal reflux disease without esophagitis: Secondary | ICD-10-CM

## 2024-05-31 NOTE — Telephone Encounter (Signed)
 Requested Prescriptions  Pending Prescriptions Disp Refills   famotidine  (PEPCID ) 20 MG tablet [Pharmacy Med Name: Famotidine  20 MG Oral Tablet] 90 tablet 0    Sig: Take 1 tablet by mouth once daily     Gastroenterology:  H2 Antagonists Passed - 05/31/2024 12:25 PM      Passed - Valid encounter within last 12 months    Recent Outpatient Visits           6 months ago Medication refill   [facility] Leavy Mole, PA-C

## 2024-06-02 DIAGNOSIS — I10 Essential (primary) hypertension: Secondary | ICD-10-CM | POA: Diagnosis not present

## 2024-06-02 DIAGNOSIS — I482 Chronic atrial fibrillation, unspecified: Secondary | ICD-10-CM | POA: Diagnosis not present

## 2024-06-02 DIAGNOSIS — E782 Mixed hyperlipidemia: Secondary | ICD-10-CM | POA: Diagnosis not present

## 2024-06-02 DIAGNOSIS — I340 Nonrheumatic mitral (valve) insufficiency: Secondary | ICD-10-CM | POA: Diagnosis not present

## 2024-06-02 NOTE — Progress Notes (Unsigned)
 Name: Duane [name]   MRN: [id number]    DOB: Oct 28, 1950   Date:06/03/2024       Progress Note  Chief Complaint  Patient presents with   Medical Management of Chronic Issues    6 month follow-up   Hyperthyroidism   Hyperlipidemia     Subjective:   Duane [name] is a 73 y.o. male, presents to clinic for routine follow up on chronic conditions  HLD due for labs on crestor  10 Lab Results  Component Value Date   CHOL 137 01/24/2023   HDL 46 01/24/2023   LDLCALC 73 01/24/2023   TRIG 95 01/24/2023   CHOLHDL 3.0 01/24/2023   Hypothyroid on levothyroxine  88  mcg daily Lab Results  Component Value Date   TSH 2.60 01/24/2023  Current Symptoms: denies fatigue, weight changes, heat/cold intolerance, bowel/skin changes or CVS symptoms Most recent results are below; we will be repeating labs today. Lab Results  Component Value Date   TSH 2.60 01/24/2023       Current Outpatient Medications:    allopurinol  (ZYLOPRIM ) 300 MG tablet, Take 300 mg by mouth daily., Disp: , Rfl:    apixaban  (ELIQUIS ) 5 MG TABS tablet, Take 1 tablet by mouth every 12 (twelve) hours., Disp: , Rfl:    Ascorbic Acid  (VITAMIN C) 1000 MG tablet, Take 1 tablet by mouth daily., Disp: , Rfl:    calcitRIOL  (ROCALTROL ) 0.25 MCG capsule, Take 0.25 mcg daily by mouth. , Disp: , Rfl:    Cholecalciferol  (VITAMIN D3) 2000 units capsule, Take 2,000 Units by mouth daily., Disp: , Rfl:    cyanocobalamin  500 MCG tablet, Take 500 mcg by mouth daily., Disp: , Rfl:    diltiazem  (CARDIZEM  CD) 120 MG 24 hr capsule, Take 120 mg by mouth daily., Disp: , Rfl:    famotidine  (PEPCID ) 20 MG tablet, Take 1 tablet by mouth once daily, Disp: 90 tablet, Rfl: 0   ferrous sulfate  325 (65 FE) MG tablet, Take 325 mg by mouth 3 (three) times a week. M-W-F, Disp: , Rfl:    losartan  (COZAAR ) 25 MG tablet, Take 1 tablet by mouth daily., Disp: , Rfl:    doxazosin  (CARDURA ) 1 MG tablet, Take 1 tablet (1 mg total) by mouth daily.,  Disp: 90 tablet, Rfl: 0   FLUoxetine  (PROZAC ) 20 MG tablet, Take 1 tablet (20 mg total) by mouth daily., Disp: 90 tablet, Rfl: 3   levothyroxine  (SYNTHROID ) 88 MCG tablet, Take 1 tablet (88 mcg total) by mouth daily before breakfast., Disp: 90 tablet, Rfl: 1   [START ON 07/05/2024] methylphenidate  (RITALIN ) 5 MG tablet, Take 5 mg po every morning and may take 5 mg po as needed in afternoon, Disp: 120 tablet, Rfl: 0   [START ON 08/30/2024] methylphenidate  (RITALIN ) 5 MG tablet, Take 5 mg po every morning and may take 5 mg po as needed in afternoon, Disp: 120 tablet, Rfl: 0   mirtazapine  (REMERON ) 15 MG tablet, Take 1 tablet (15 mg total) by mouth at bedtime., Disp: 90 tablet, Rfl: 1   rosuvastatin  (CRESTOR ) 10 MG tablet, Take 1 tablet (10 mg total) by mouth daily., Disp: 90 tablet, Rfl: 1  Patient Active Problem List   Diagnosis Date Noted   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, with agitation (HCC) 07/19/2022   Insomnia 07/19/2022   Long term current use of anticoagulant 07/19/2022   Attention deficit disorder (ADD) without hyperactivity 07/19/2022   Thrombocytopenia (HCC) 12/27/2020   Hypomagnesemia 11/10/2020   Valvular  heart disease 11/10/2020   Hemochromatosis, hereditary (HCC) 07/21/2020   Episode of recurrent major depressive disorder (HCC) 06/11/2019   Aortic ectasia, abdominal (HCC) 05/22/2018   Hyperparathyroidism, secondary renal (HCC) 10/02/2017   Controlled substance agreement signed 05/23/2017   On stimulant medication 02/22/2017   Vitamin D  deficiency 09/04/2015   Anemia, chronic renal failure 06/09/2015   Chronic kidney disease, stage IV (severe) (HCC)    Essential hypertension    Chronic gout due to renal impairment    Hyperlipidemia    Hypothyroidism following radioiodine therapy    Restrictive lung disease    Atrial fibrillation (HCC)    History of arterial ischemic stroke 02/21/2014   Left hemiparesis (HCC) 02/11/2014    Past  Surgical History:  Procedure Laterality Date   CATARACT EXTRACTION W/PHACO Right 08/27/2016   Procedure: CATARACT EXTRACTION PHACO AND INTRAOCULAR LENS PLACEMENT (IOC);  Surgeon: Adine Oneil Novak, MD;  Location: [facility];  Service: Ophthalmology;  Laterality: Right;  RIGHT   ENTEROSTOMY CLOSURE  11/27/15   ileostomy takedown   EXPLORATORY LAPAROTOMY  11/27/15   illeostomy  07/2014   POLYPECTOMY  10/15   GI surgery to remove polyp   TEE WITHOUT CARDIOVERSION N/A 05/06/2023   Procedure: TRANSESOPHAGEAL ECHOCARDIOGRAM (TEE);  Surgeon: Dewane Shiner, DO;  Location: [facility];  Service: Cardiovascular;  Laterality: N/A;    Family History  Problem Relation Age of Onset   Arthritis Mother    Heart disease Mother    Heart attack Father    Heart disease Father    Cirrhosis Sister    Hyperparathyroidism Neg Hx     Social History   Tobacco Use   Smoking status: Never   Smokeless tobacco: Never   Tobacco comments:    smoking cessation materials not required  Vaping Use   Vaping status: Never Used  Substance Use Topics   Alcohol use: No   Drug use: No     Allergies  Allergen Reactions   Bactrim [Sulfamethoxazole-Trimethoprim] Other (See Comments)    Hyperkalemia   Chlorpromazine Other (See Comments)    Hiccups, hallucinations   Cyclobenzaprine Other (See Comments)    Sleeps for 8 hrs after taking it   Oxycodone Other (See Comments)    Excessive sleepiness    Health Maintenance  Topic Date Due   Medicare Annual Wellness (AWV)  07/02/2024   Colonoscopy  07/07/2024   COVID-19 Vaccine (3 - Moderna risk series) 06/19/2024 (Originally 06/23/2020)   INFLUENZA VACCINE  01/11/2025 (Originally 05/14/2024)   DTaP/Tdap/Td (2 - Td or Tdap) 01/09/2026   Pneumococcal Vaccine: 50+ Years  Completed   Hepatitis C Screening  Completed   Zoster Vaccines- Shingrix   Completed   HPV VACCINES  Aged Out   Meningococcal B Vaccine  Aged Out    Chart Review Today: I personally  reviewed active problem list, medication list, allergies, family history, social history, health maintenance, notes from last encounter, lab results, imaging with the patient/caregiver today.   Review of Systems  Constitutional: Negative.   HENT: Negative.    Eyes: Negative.   Respiratory: Negative.    Cardiovascular: Negative.   Gastrointestinal: Negative.   Endocrine: Negative.   Genitourinary: Negative.   Musculoskeletal: Negative.   Skin: Negative.   Allergic/Immunologic: Negative.   Neurological: Negative.   Hematological: Negative.   Psychiatric/Behavioral: Negative.    All other systems reviewed and are negative.    Objective:   Vitals:   06/03/24 1000  BP: 128/66  Pulse: 82  Resp: 16  SpO2:  97%  Weight: 206 lb (93.4 kg)  Height: 6' 4 (1.93 m)    Body mass index is 25.08 kg/m.  Physical Exam Vitals and nursing note reviewed.  Constitutional:      General: He is not in acute distress.    Appearance: Normal appearance. He is well-developed, well-groomed and overweight. He is not ill-appearing, toxic-appearing or diaphoretic.  HENT:     Head: Normocephalic and atraumatic.     Right Ear: External ear normal.     Left Ear: External ear normal.     Nose: Nose normal.  Eyes:     General: No scleral icterus.       Right eye: No discharge.        Left eye: No discharge.     Conjunctiva/sclera: Conjunctivae normal.  Neck:     Trachea: No tracheal deviation.  Cardiovascular:     Rate and Rhythm: Normal rate and regular rhythm.     Pulses: Normal pulses.     Heart sounds: Normal heart sounds. No murmur heard.    No friction rub. No gallop.  Pulmonary:     Effort: Pulmonary effort is normal. No respiratory distress.     Breath sounds: Normal breath sounds. No stridor. No wheezing, rhonchi or rales.  Abdominal:     General: Bowel sounds are normal.     Palpations: Abdomen is soft.  Musculoskeletal:     Right lower leg: No edema.     Left lower leg: No  edema.  Skin:    General: Skin is warm and dry.     Findings: No bruising, erythema or rash.  Neurological:     Mental Status: He is alert. Mental status is at baseline.     Motor: No abnormal muscle tone.     Coordination: Coordination normal.     Gait: Gait normal.  Psychiatric:        Attention and Perception: Attention normal.        Mood and Affect: Mood normal. Mood is not anxious or depressed.        Speech: Speech is not delayed.        Behavior: Behavior normal. Behavior is cooperative.     Comments: Slightly flat affect, poor eye contact, looks at the ground and wife answers most questions     Reviewed recent labs and ECHO results   Results for orders placed or performed during the hospital encounter of 05/06/23  ECHO TEE   Collection Time: 05/06/23 12:54 PM  Result Value Ref Range   MV M vel 5.39 m/s   MV Peak grad 116.2 mmHg   Radius 1.00 cm   Est EF 55 - 60%       Assessment & Plan:   Hypothyroidism following radioiodine therapy Assessment & Plan: Patient is euthyroid, no recent changes to medications Due for labs since it has been about 1 year TSH obtained today and meds refilled - levothyroxine  dose would be adjusted if needed per lab results  Orders: -     TSH -     Levothyroxine  Sodium; Take 1 tablet (88 mcg total) by mouth daily before breakfast.  Dispense: 90 tablet; Refill: 1  Mixed hyperlipidemia Assessment & Plan: He has been on crestor  10 mg daily for years and continues to do well w/o SE or concerns and no new sx Continued good med compliance due for recheck of lipids, hx of stroke Lab Results  Component Value Date   CHOL 137 01/24/2023   HDL 46  01/24/2023   LDLCALC 73 01/24/2023   TRIG 95 01/24/2023   CHOLHDL 3.0 01/24/2023     Orders: -     Lipid panel -     Rosuvastatin  Calcium ; Take 1 tablet (10 mg total) by mouth daily.  Dispense: 90 tablet; Refill: 1  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, with  agitation (HCC) Assessment & Plan: Moods/behavior stable with prozac  and ritalin  Wife and pt have no concerns at this time  Orders: -     Methylphenidate  HCl; Take 5 mg po every morning and may take 5 mg po as needed in afternoon  Dispense: 120 tablet; Refill: 0 -     FLUoxetine  HCl; Take 1 tablet (20 mg total) by mouth daily.  Dispense: 90 tablet; Refill: 3  On stimulant medication Assessment & Plan: HR VS and med efficacy reviewed today No SE or concerns at this time, refills on his ritalin  after discussing with pt and reviewing PDMP  Orders: -     Methylphenidate  HCl; Take 5 mg po every morning and may take 5 mg po as needed in afternoon  Dispense: 120 tablet; Refill: 0  Left hemiparesis (HCC) Assessment & Plan: No change to baseline mobility/cognitive/attention/personality/moods, continue same meds   Atrial fibrillation, unspecified type [facility]) Assessment & Plan: Managed by cardiology, on cardizem  and anticoagulated Reviewed recent cardiology notes, testing and labs Pt asx   Chronic kidney disease, stage IV (severe) (HCC) Assessment & Plan: Per nephrology   Attention deficit disorder (ADD) without hyperactivity Assessment & Plan: memory deficits s/p right MCA stroke 2015 On methylphenidate  once daily, previously up to TID Meds refilled after reviewing PDMP (See above)  Orders: -     Methylphenidate  HCl; Take 5 mg po every morning and may take 5 mg po as needed in afternoon  Dispense: 120 tablet; Refill: 0 -     Methylphenidate  HCl; Take 5 mg po every morning and may take 5 mg po as needed in afternoon  Dispense: 120 tablet; Refill: 0  Insomnia, unspecified type Assessment & Plan: Symptoms well controlled Remeron , no change  Orders: -     Mirtazapine ; Take 1 tablet (15 mg total) by mouth at bedtime.  Dispense: 90 tablet; Refill: 1  Essential hypertension Assessment & Plan: BP at goal today, stable, well controlled Recent labs reviewed from nephrology No med  changes, meds refilled BP Readings from Last 3 Encounters:  06/03/24 128/66  11/21/23 120/74  08/21/23 114/62     Orders: -     Doxazosin  Mesylate; Take 1 tablet (1 mg total) by mouth daily.  Dispense: 90 tablet; Refill: 0     Return in about 6 months (around 12/04/2024) for Routine follow-up refill ADHD/stroke meds .   Michelene Cower, PA-C 06/03/24 11:08 AM

## 2024-06-03 ENCOUNTER — Ambulatory Visit (INDEPENDENT_AMBULATORY_CARE_PROVIDER_SITE_OTHER): Admitting: Family Medicine

## 2024-06-03 ENCOUNTER — Encounter: Payer: Self-pay | Admitting: Family Medicine

## 2024-06-03 VITALS — BP 128/66 | HR 82 | Resp 16 | Ht 76.0 in | Wt 206.0 lb

## 2024-06-03 DIAGNOSIS — I69398 Other sequelae of cerebral infarction: Secondary | ICD-10-CM

## 2024-06-03 DIAGNOSIS — I10 Essential (primary) hypertension: Secondary | ICD-10-CM

## 2024-06-03 DIAGNOSIS — F988 Other specified behavioral and emotional disorders with onset usually occurring in childhood and adolescence: Secondary | ICD-10-CM

## 2024-06-03 DIAGNOSIS — G4700 Insomnia, unspecified: Secondary | ICD-10-CM

## 2024-06-03 DIAGNOSIS — E782 Mixed hyperlipidemia: Secondary | ICD-10-CM | POA: Diagnosis not present

## 2024-06-03 DIAGNOSIS — F02A11 Dementia in other diseases classified elsewhere, mild, with agitation: Secondary | ICD-10-CM

## 2024-06-03 DIAGNOSIS — E890 Postprocedural hypothyroidism: Secondary | ICD-10-CM | POA: Diagnosis not present

## 2024-06-03 DIAGNOSIS — I4891 Unspecified atrial fibrillation: Secondary | ICD-10-CM

## 2024-06-03 DIAGNOSIS — Z79899 Other long term (current) drug therapy: Secondary | ICD-10-CM | POA: Diagnosis not present

## 2024-06-03 DIAGNOSIS — G8194 Hemiplegia, unspecified affecting left nondominant side: Secondary | ICD-10-CM

## 2024-06-03 DIAGNOSIS — N184 Chronic kidney disease, stage 4 (severe): Secondary | ICD-10-CM

## 2024-06-03 LAB — LIPID PANEL
Cholesterol: 125 mg/dL (ref <200)
HDL: 44 mg/dL (ref >=40)
LDL Cholesterol (Calc): 63 mg/dL
Non-HDL Cholesterol (Calc): 81 mg/dL (ref <130)
Total CHOL/HDL Ratio: 2.8 (calc) (ref <5.0)
Triglycerides: 95 mg/dL (ref <150)

## 2024-06-03 LAB — TSH: TSH: 0.57 m[IU]/L (ref 0.40–4.50)

## 2024-06-03 MED ORDER — ROSUVASTATIN CALCIUM 10 MG PO TABS
10.0000 mg | ORAL_TABLET | Freq: Every day | ORAL | 1 refills | Status: AC
Start: 2024-06-03 — End: ?

## 2024-06-03 MED ORDER — METHYLPHENIDATE HCL 5 MG PO TABS: ORAL_TABLET | ORAL | 0 refills | Status: AC

## 2024-06-03 MED ORDER — MIRTAZAPINE 15 MG PO TABS: 15.0000 mg | ORAL_TABLET | Freq: Every day | ORAL | 1 refills | Status: AC

## 2024-06-03 MED ORDER — DOXAZOSIN MESYLATE 1 MG PO TABS: 1.0000 mg | ORAL_TABLET | Freq: Every day | ORAL | 0 refills | Status: AC

## 2024-06-03 MED ORDER — LEVOTHYROXINE SODIUM 88 MCG PO TABS: 88.0000 ug | ORAL_TABLET | Freq: Every day | ORAL | 1 refills | Status: AC

## 2024-06-03 MED ORDER — FLUOXETINE HCL 20 MG PO TABS
20.0000 mg | ORAL_TABLET | Freq: Every day | ORAL | 3 refills | Status: AC
Start: 2024-06-03 — End: ?

## 2024-06-03 NOTE — Assessment & Plan Note (Signed)
No change to baseline mobility/cognitive/attention/personality/moods, continue same meds

## 2024-06-03 NOTE — Assessment & Plan Note (Signed)
 He has been on crestor  10 mg daily for years and continues to do well w/o SE or concerns and no new sx Continued good med compliance due for recheck of lipids, hx of stroke Lab Results  Component Value Date   CHOL 137 01/24/2023   HDL 46 01/24/2023   LDLCALC 73 01/24/2023   TRIG 95 01/24/2023   CHOLHDL 3.0 01/24/2023

## 2024-06-03 NOTE — Assessment & Plan Note (Signed)
 Patient is euthyroid, no recent changes to medications Due for labs since it has been about 1 year TSH obtained today and meds refilled - levothyroxine  dose would be adjusted if needed per lab results

## 2024-06-03 NOTE — Assessment & Plan Note (Signed)
 Moods/behavior stable with prozac  and ritalin  Wife and pt have no concerns at this time

## 2024-06-03 NOTE — Assessment & Plan Note (Signed)
 HR VS and med efficacy reviewed today No SE or concerns at this time, refills on his ritalin  after discussing with pt and reviewing PDMP

## 2024-06-03 NOTE — Assessment & Plan Note (Signed)
 memory deficits s/p right MCA stroke 2015 On methylphenidate  once daily, previously up to TID Meds refilled after reviewing PDMP (See above)

## 2024-06-03 NOTE — Assessment & Plan Note (Signed)
 Symptoms well controlled Remeron , no change

## 2024-06-03 NOTE — Assessment & Plan Note (Signed)
 Per nephrology

## 2024-06-03 NOTE — Assessment & Plan Note (Signed)
 Managed by cardiology, on cardizem  and anticoagulated Reviewed recent cardiology notes, testing and labs Pt asx

## 2024-06-03 NOTE — Assessment & Plan Note (Signed)
 BP at goal today, stable, well controlled Recent labs reviewed from nephrology No med changes, meds refilled BP Readings from Last 3 Encounters:  06/03/24 128/66  11/21/23 120/74  08/21/23 114/62

## 2024-06-04 ENCOUNTER — Ambulatory Visit: Payer: Self-pay | Admitting: Family Medicine

## 2024-07-08 ENCOUNTER — Ambulatory Visit (INDEPENDENT_AMBULATORY_CARE_PROVIDER_SITE_OTHER): Payer: Self-pay

## 2024-07-08 DIAGNOSIS — Z0000 Encounter for general adult medical examination without abnormal findings: Secondary | ICD-10-CM | POA: Diagnosis not present

## 2024-07-08 NOTE — Progress Notes (Signed)
 Subjective:   Duane [name] is a 73 y.o. who presents for a Medicare Wellness preventive visit.  As a reminder, Annual Wellness Visits don't include a physical exam, and some assessments may be limited, especially if this visit is performed virtually. We may recommend an in-person follow-up visit with your provider if needed.  Visit Complete: Virtual I connected with  Duane [name] on 07/08/24 by a audio enabled telemedicine application and verified that I am speaking with the correct person using two identifiers.  Patient Location: Home  Provider Location: Home Office  I discussed the limitations of evaluation and management by telemedicine. The patient expressed understanding and agreed to proceed.  Vital Signs: Because this visit was a virtual/telehealth visit, some criteria may be missing or patient reported. Any vitals not documented were not able to be obtained and vitals that have been documented are patient reported.  VideoDeclined- This patient declined Librarian, academic. Therefore the visit was completed with audio only.  Persons Participating in Visit: Patient.  AWV Questionnaire: No: Patient Medicare AWV questionnaire was not completed prior to this visit.  Cardiac Risk Factors include: advanced age (>12men, >55 women);dyslipidemia;hypertension;male gender;sedentary lifestyle     Objective:    There were no vitals filed for this visit. There is no height or weight on file to calculate BMI.     07/08/2024    9:40 AM 07/03/2023   10:09 AM 05/06/2023   11:11 AM 06/27/2022   11:19 AM 06/26/2021   11:02 AM 06/22/2020   11:04 AM 01/21/2020    5:17 PM  Advanced Directives  Does Patient Have a Medical Advance Directive? No No No No No No No  Would patient like information on creating a medical advance directive? No - Patient declined  No - Patient declined No - Patient declined No - Patient declined No - Patient declined No - Patient  declined    Current Medications (verified) Outpatient Encounter Medications as of 07/08/2024  Medication Sig   allopurinol  (ZYLOPRIM ) 300 MG tablet Take 300 mg by mouth daily.   apixaban  (ELIQUIS ) 5 MG TABS tablet Take 1 tablet by mouth every 12 (twelve) hours.   Ascorbic Acid  (VITAMIN C) 1000 MG tablet Take 1 tablet by mouth daily.   calcitRIOL  (ROCALTROL ) 0.25 MCG capsule Take 0.25 mcg daily by mouth.    Cholecalciferol  (VITAMIN D3) 2000 units capsule Take 2,000 Units by mouth daily.   cyanocobalamin  500 MCG tablet Take 500 mcg by mouth daily.   diltiazem  (CARDIZEM  CD) 120 MG 24 hr capsule Take 120 mg by mouth daily.   doxazosin  (CARDURA ) 1 MG tablet Take 1 tablet (1 mg total) by mouth daily.   famotidine  (PEPCID ) 20 MG tablet Take 1 tablet by mouth once daily   ferrous sulfate  325 (65 FE) MG tablet Take 325 mg by mouth 3 (three) times a week. M-W-F   FLUoxetine  (PROZAC ) 20 MG tablet Take 1 tablet (20 mg total) by mouth daily.   levothyroxine  (SYNTHROID ) 88 MCG tablet Take 1 tablet (88 mcg total) by mouth daily before breakfast.   losartan  (COZAAR ) 25 MG tablet Take 1 tablet by mouth daily.   methylphenidate  (RITALIN ) 5 MG tablet Take 5 mg po every morning and may take 5 mg po as needed in afternoon   [START ON 08/30/2024] methylphenidate  (RITALIN ) 5 MG tablet Take 5 mg po every morning and may take 5 mg po as needed in afternoon   mirtazapine  (REMERON ) 15 MG tablet Take 1 tablet (15  mg total) by mouth at bedtime.   rosuvastatin  (CRESTOR ) 10 MG tablet Take 1 tablet (10 mg total) by mouth daily.   No facility-administered encounter medications on file as of 07/08/2024.    Allergies (verified) Bactrim [sulfamethoxazole-trimethoprim], Chlorpromazine, Cyclobenzaprine, and Oxycodone   History: Past Medical History:  Diagnosis Date   A-fib (HCC)    requiring life-long anticouagulation   Aortic ectasia, abdominal 05/22/2018   US  Sept 2018   Atrial fibrillation with RVR (HCC) 01/22/2020    Cerebellar infarct (HCC)    Chronic low back pain without sciatica 08/21/2016   CKD (chronic kidney disease)    creatinine baseline 1.3-1.5 in 2015 (2024 - stage 4)   Colon polyp 11/27/2015   GERD (gastroesophageal reflux disease)    Gout    Graves disease    H/O hyperkalemia    with Bactrim   Hyperlipidemia    requiring life long statin therapy   Hyperparathyroidism, secondary renal 10/02/2017   Hypertension    Hypomagnesemia    Hypothyroidism    Left hemiparesis (HCC) 02/2014   s/p strke   Mild aortic regurgitation    Mitral regurgitation    severe   Moderate mitral regurgitation by prior echocardiogram    Poor short term memory    Renal insufficiency    Restrictive lung disease    mild   Severe mitral regurgitation by prior echocardiogram    Stroke (HCC) 02/20/2014   left side hemiparesis, dysphagia   UTI (urinary tract infection) due to Enterococcus 02/2014   Vitamin D  deficiency disease    Past Surgical History:  Procedure Laterality Date   CATARACT EXTRACTION W/PHACO Right 08/27/2016   Procedure: CATARACT EXTRACTION PHACO AND INTRAOCULAR LENS PLACEMENT (IOC);  Surgeon: Adine Oneil Novak, MD;  Location: [facility];  Service: Ophthalmology;  Laterality: Right;  RIGHT   ENTEROSTOMY CLOSURE  11/27/15   ileostomy takedown   EXPLORATORY LAPAROTOMY  11/27/15   illeostomy  07/2014   POLYPECTOMY  10/15   GI surgery to remove polyp   TEE WITHOUT CARDIOVERSION N/A 05/06/2023   Procedure: TRANSESOPHAGEAL ECHOCARDIOGRAM (TEE);  Surgeon: Dewane Shiner, DO;  Location: [facility];  Service: Cardiovascular;  Laterality: N/A;   Family History  Problem Relation Age of Onset   Arthritis Mother    Heart disease Mother    Heart attack Father    Heart disease Father    Cirrhosis Sister    Hyperparathyroidism Neg Hx    Social History   Socioeconomic History   Marital status: Married    Spouse name: Duane [name]   Number of children: 2   Years of education: Not on file    Highest education level: 12th grade  Occupational History   Occupation: Retired  Tobacco Use   Smoking status: Never   Smokeless tobacco: Never   Tobacco comments:    smoking cessation materials not required  Vaping Use   Vaping status: Never Used  Substance and Sexual Activity   Alcohol use: No   Drug use: No   Sexual activity: Not Currently  Other Topics Concern   Not on file  Social History Narrative   Lives with wife at home: kids grown & live in area (daughter). Son lives in [place] .    Social Drivers of Corporate investment banker Strain: Low Risk  (07/08/2024)   Overall Financial Resource Strain (CARDIA)    Difficulty of Paying Living Expenses: Not hard at all  Food Insecurity: No Food Insecurity (07/08/2024)   Hunger Vital Sign  Worried About Programme researcher, broadcasting/film/video in the Last Year: Never true    Ran Out of Food in the Last Year: Never true  Transportation Needs: No Transportation Needs (07/08/2024)   PRAPARE - Administrator, Civil Service (Medical): No    Lack of Transportation (Non-Medical): No  Physical Activity: Insufficiently Active (07/08/2024)   Exercise Vital Sign    Days of Exercise per Week: 3 days    Minutes of Exercise per Session: 30 min  Stress: No Stress Concern Present (07/08/2024)   [employer] of Occupational Health - Occupational Stress Questionnaire    Feeling of Stress: Not at all  Social Connections: Moderately Isolated (07/08/2024)   Social Connection and Isolation Panel    Frequency of Communication with Friends and Family: More than three times a week    Frequency of Social Gatherings with Friends and Family: More than three times a week    Attends Religious Services: Never    Database administrator or Organizations: No    Attends Engineer, structural: Never    Marital Status: Married    Tobacco Counseling Counseling given: Not Answered Tobacco comments: smoking cessation materials not required    Clinical  Intake:  Pre-visit preparation completed: Yes  Pain : No/denies pain     BMI - recorded: 25.1 Nutritional Status: BMI 25 -29 Overweight Nutritional Risks: None Diabetes: No  Lab Results  Component Value Date   HGBA1C 5.6 11/17/2020     How often do you need to have someone help you when you read instructions, pamphlets, or other written materials from your doctor or pharmacy?: 1 - Never  Interpreter Needed?: No  Information entered by :: Duane DAS, LPN   Activities of Daily Living     07/08/2024    9:41 AM 08/21/2023    9:15 AM  In your present state of health, do you have any difficulty performing the following activities:  Hearing? 0 0  Vision? 0 0  Difficulty concentrating or making decisions? 0 1  Walking or climbing stairs? 1 0  Dressing or bathing? 0 0  Doing errands, shopping? 0 0  Preparing Food and eating ? N   Using the Toilet? N   In the past six months, have you accidently leaked urine? N   Do you have problems with loss of bowel control? N   Managing your Medications? Y   Managing your Finances? N   Housekeeping or managing your Housekeeping? N     Patient Care Team: Tapia, Leisa, PA-C as PCP - General (Family Medicine) Bosie Vinie LABOR, MD as Consulting Physician (Cardiology) Lateef, Munsoor, MD (Internal Medicine) Myrna Adine Anes, MD as Consulting Physician (Ophthalmology)  I have updated your Care Teams any recent Medical Services you may have received from other providers in the past year.     Assessment:   This is a routine wellness examination for Duane [name].  Hearing/Vision screen Hearing Screening - Comments:: NO AIDS Vision Screening - Comments:: READERS- DR.KING   Goals Addressed             This Visit's Progress    DIET - EAT MORE FRUITS AND VEGETABLES         Depression Screen     07/08/2024    9:39 AM 06/03/2024   10:09 AM 11/21/2023    9:22 AM 08/21/2023    9:15 AM 07/03/2023   10:07 AM 01/24/2023   11:06 AM  07/19/2022   11:06 AM  PHQ 2/9  Scores  PHQ - 2 Score 0 0 0 0 0 0 0  PHQ- 9 Score 0 0 0 0  0 0    Fall Risk     07/08/2024    9:41 AM 08/21/2023    9:15 AM 07/03/2023   10:04 AM 01/24/2023   11:06 AM 07/19/2022   11:06 AM  Fall Risk   Falls in the past year? 0 0 0 0 0  Number falls in past yr: 0 0 0 0 0  Injury with Fall? 0 0 0 0 0  Risk for fall due to : No Fall Risks No Fall Risks No Fall Risks No Fall Risks No Fall Risks  Follow up Falls evaluation completed;Falls prevention discussed Falls prevention discussed;Education provided;Falls evaluation completed Education provided;Falls prevention discussed Falls prevention discussed;Education provided;Falls evaluation completed Falls prevention discussed;Education provided      Data saved with a previous flowsheet row definition    MEDICARE RISK AT HOME:  Medicare Risk at Home Any stairs in or around the home?: Yes If so, are there any without handrails?: No Home free of loose throw rugs in walkways, pet beds, electrical cords, etc?: Yes Adequate lighting in your home to reduce risk of falls?: Yes Life alert?: No Use of a cane, walker or w/c?: No Grab bars in the bathroom?: Yes Shower chair or bench in shower?: Yes Elevated toilet seat or a handicapped toilet?: Yes  TIMED UP AND GO:  Was the test performed?  No  Cognitive Function: 6CIT completed        07/08/2024    9:43 AM 07/03/2023   10:11 AM 06/27/2022   11:20 AM 06/22/2020   11:06 AM 06/17/2019   10:59 AM  6CIT Screen  What Year? 0 points 0 points 0 points 0 points 0 points  What month? 0 points 0 points 0 points 0 points 0 points  What time? 0 points 0 points 0 points 0 points 0 points  Count back from 20 0 points 0 points 0 points 0 points 0 points  Months in reverse 0 points 0 points 0 points 0 points 0 points  Repeat phrase 0 points 0 points 0 points 0 points 0 points  Total Score 0 points 0 points 0 points 0 points 0 points    Immunizations Immunization History   Administered Date(s) Administered   Fluad Quad(high Dose 65+) 08/02/2019, 06/22/2020, 06/26/2021, 06/27/2022   Fluad Trivalent(High Dose 65+) 07/03/2023   INFLUENZA, HIGH DOSE SEASONAL PF 11/21/2016, 09/09/2017, 08/21/2018   Moderna Sars-Covid-2 Vaccination 04/28/2020, 05/26/2020   Pneumococcal Conjugate-13 12/26/2016   Pneumococcal Polysaccharide-23 03/12/2018   Tdap 01/10/2016   Zoster Recombinant(Shingrix ) 08/23/2022, 11/22/2022    Screening Tests Health Maintenance  Topic Date Due   COVID-19 Vaccine (3 - Moderna risk series) 06/23/2020   Colonoscopy  07/07/2024   Influenza Vaccine  01/11/2025 (Originally 05/14/2024)   Medicare Annual Wellness (AWV)  07/08/2025   DTaP/Tdap/Td (2 - Td or Tdap) 01/09/2026   Pneumococcal Vaccine: 50+ Years  Completed   Hepatitis C Screening  Completed   Zoster Vaccines- Shingrix   Completed   HPV VACCINES  Aged Out   Meningococcal B Vaccine  Aged Out    Health Maintenance Items Addressed: NEEDS PNA, FLU & COVID; DECLINES COLONOSCOPY   Additional Screening:  Vision Screening: Recommended annual ophthalmology exams for early detection of glaucoma and other disorders of the eye. Is the patient up to date with their annual eye exam?  Yes  Who is the provider or what is  the name of the office in which the patient attends annual eye exams? DR.KING  Dental Screening: Recommended annual dental exams for proper oral hygiene  Community Resource Referral / Chronic Care Management: CRR required this visit?  No   CCM required this visit?  No   Plan:    I have personally reviewed and noted the following in the patient's chart:   Medical and social history Use of alcohol, tobacco or illicit drugs  Current medications and supplements including opioid prescriptions. Patient is not currently taking opioid prescriptions. Functional ability and status Nutritional status Physical activity Advanced directives List of other  physicians Hospitalizations, surgeries, and ER visits in previous 12 months Vitals Screenings to include cognitive, depression, and falls Referrals and appointments  In addition, I have reviewed and discussed with patient certain preventive protocols, quality metrics, and best practice recommendations. A written personalized care plan for preventive services as well as general preventive health recommendations were provided to patient.   Duane GORMAN Das, LPN   [id number]   After Visit Summary: (MyChart) Due to this being a telephonic visit, the after visit summary with patients personalized plan was offered to patient via MyChart   Notes: Nothing significant to report at this time.

## 2024-07-08 NOTE — Patient Instructions (Addendum)
 Mr. Duane [name],  Thank you for taking the time for your Medicare Wellness Visit. I appreciate your continued commitment to your health goals. Please review the care plan we discussed, and feel free to reach out if I can assist you further.  Medicare recommends these wellness visits once per year to help you and your care team stay ahead of potential health issues. These visits are designed to focus on prevention, allowing your provider to concentrate on managing your acute and chronic conditions during your regular appointments.  Please note that Annual Wellness Visits do not include a physical exam. Some assessments may be limited, especially if the visit was conducted virtually. If needed, we may recommend a separate in-person follow-up with your provider.  Ongoing Care Seeing your primary care provider every 3 to 6 months helps us  monitor your health and provide consistent, personalized care.   Referrals If a referral was made during today's visit and you haven't received any updates within two weeks, please contact the referred provider directly to check on the status.  Recommended Screenings:  Health Maintenance  Topic Date Due   COVID-19 Vaccine (3 - Moderna risk series) 06/23/2020   Colon Cancer Screening  07/07/2024   Flu Shot  01/11/2025*   Medicare Annual Wellness Visit  07/08/2025   DTaP/Tdap/Td vaccine (2 - Td or Tdap) 01/09/2026   Pneumococcal Vaccine for age over 59  Completed   Hepatitis C Screening  Completed   Zoster (Shingles) Vaccine  Completed   HPV Vaccine  Aged Out   Meningitis B Vaccine  Aged Out  *Topic was postponed. The date shown is not the original due date.     Advance Care Planning is important because it: Ensures you receive medical care that aligns with your values, goals, and preferences. Provides guidance to your family and loved ones, reducing the emotional burden of decision-making during critical moments.  Vision: Annual vision screenings are  recommended for early detection of glaucoma, cataracts, and diabetic retinopathy. These exams can also reveal signs of chronic conditions such as diabetes and high blood pressure.  Dental: Annual dental screenings help detect early signs of oral cancer, gum disease, and other conditions linked to overall health, including heart disease and diabetes.  Please see the attached documents for additional preventive care recommendations.   NEXT AWV 07/14/25 @ 9:30 AM BY PHONE

## 2024-08-25 ENCOUNTER — Ambulatory Visit
Admission: EM | Admit: 2024-08-25 | Discharge: 2024-08-25 | Disposition: A | Discharge status: Home / Self Care | Attending: Physician Assistant | Admitting: Physician Assistant

## 2024-08-25 ENCOUNTER — Ambulatory Visit: Payer: Self-pay

## 2024-08-25 DIAGNOSIS — K529 Noninfective gastroenteritis and colitis, unspecified: Secondary | ICD-10-CM

## 2024-08-25 DIAGNOSIS — R112 Nausea with vomiting, unspecified: Secondary | ICD-10-CM

## 2024-08-25 DIAGNOSIS — K219 Gastro-esophageal reflux disease without esophagitis: Secondary | ICD-10-CM

## 2024-08-25 DIAGNOSIS — R1010 Upper abdominal pain, unspecified: Secondary | ICD-10-CM | POA: Diagnosis not present

## 2024-08-25 LAB — POC SOFIA SARS ANTIGEN FIA: SARS Coronavirus 2 Ag: NEGATIVE

## 2024-08-25 MED ORDER — ONDANSETRON 8 MG PO TBDP
8.0000 mg | ORAL_TABLET | Freq: Once | ORAL | Status: AC
  Administered 2024-08-25: 8 mg via ORAL

## 2024-08-25 MED ORDER — ONDANSETRON 4 MG PO TBDP: 4.0000 mg | ORAL_TABLET | Freq: Four times a day (QID) | ORAL | 0 refills | Status: AC | PRN

## 2024-08-25 NOTE — ED Triage Notes (Signed)
 Pt c/o upper abd pain & N/V/D x1 day. Has tried mylanta w/minor relief. Had 2 episodes of emesis today. Has been able to keep foods & fluids down. Hx of GERD.

## 2024-08-25 NOTE — ED Provider Notes (Signed)
 [facility]    CSN: [id number] Arrival date & time: 08/25/24  1437      History   Chief Complaint Chief Complaint  Patient presents with   Abdominal Pain   Emesis   Diarrhea    HPI Duane [name] is a 73 y.o. male with history of hypertension, gout, chronic kidney disease, hyperlipidemia, hypothyroidism, restrictive lung disease, atrial fibrillation, previous stroke with left hemiparesis, GERD, mitral regurgitation, aortic regurgitation and long-term use of anticoagulants.  Patient presents today for upper abdominal pain, nausea/vomiting and diarrhea since dinner last night. He did have a large meal. He was having pain before dinner but it worsened after eating.  Pain was sharp, stabbing and severe last night.  Pain in epigastric and periumbilical region.  Pain does not radiate.  No constipation, blood in stool, fever, fatigue, cough, congestion, runny nose, sore throat, chest pain, shortness of breath or urinary symptoms.  Has taken Pepcid , Mylanta and Tylenol  and is feeling better today. Current abdominal pain is 1-2/10, pressure-like and intermittent. Has been able to eat oatmeal today and hold down Gatorade.   History of polypectomies in 2015.  Patient had to have an ileostomy after subsequent infection in 2015 and exploratory laparotomy in 2017 with enterostomy closure in 2017.  He has not really had any issues since then but does have a scar on his abdomen.  No increased abdominal swelling.  HPI  Past Medical History:  Diagnosis Date   A-fib [facility])    requiring life-long anticouagulation   Aortic ectasia, abdominal 05/22/2018   US  Sept 2018   Atrial fibrillation with RVR (HCC) 01/22/2020   Cerebellar infarct (HCC)    Chronic low back pain without sciatica 08/21/2016   CKD (chronic kidney disease)    creatinine baseline 1.3-1.5 in 2015 (2024 - stage 4)   Colon polyp 11/27/2015   GERD (gastroesophageal reflux disease)    Gout    Graves disease     H/O hyperkalemia    with Bactrim   Hyperlipidemia    requiring life long statin therapy   Hyperparathyroidism, secondary renal 10/02/2017   Hypertension    Hypomagnesemia    Hypothyroidism    Left hemiparesis (HCC) 02/2014   s/p strke   Mild aortic regurgitation    Mitral regurgitation    severe   Moderate mitral regurgitation by prior echocardiogram    Poor short term memory    Renal insufficiency    Restrictive lung disease    mild   Severe mitral regurgitation by prior echocardiogram    Stroke (HCC) 02/20/2014   left side hemiparesis, dysphagia   UTI (urinary tract infection) due to Enterococcus 02/2014   Vitamin D  deficiency disease     Patient Active Problem List   Diagnosis Date Noted   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, with agitation (HCC) 07/19/2022   Insomnia 07/19/2022   Long term current use of anticoagulant 07/19/2022   Attention deficit disorder (ADD) without hyperactivity 07/19/2022   Thrombocytopenia 12/27/2020   Hypomagnesemia 11/10/2020   Valvular heart disease 11/10/2020   Hemochromatosis, hereditary 07/21/2020   Episode of recurrent major depressive disorder 06/11/2019   Aortic ectasia, abdominal 05/22/2018   Hyperparathyroidism, secondary renal 10/02/2017   Controlled substance agreement signed 05/23/2017   On stimulant medication 02/22/2017   Vitamin D  deficiency 09/04/2015   Anemia, chronic renal failure 06/09/2015   Chronic kidney disease, stage IV (severe) (HCC)    Essential hypertension    Chronic gout due to renal  impairment    Hyperlipidemia    Hypothyroidism following radioiodine therapy    Restrictive lung disease    Atrial fibrillation (HCC)    History of arterial ischemic stroke 02/21/2014   Left hemiparesis (HCC) 02/11/2014    Past Surgical History:  Procedure Laterality Date   CATARACT EXTRACTION W/PHACO Right 08/27/2016   Procedure: CATARACT EXTRACTION PHACO AND INTRAOCULAR LENS PLACEMENT  (IOC);  Surgeon: Adine Oneil Novak, MD;  Location[facility];  Service: Ophthalmology;  Laterality: Right;  RIGHT   ENTEROSTOMY CLOSURE  11/27/15   ileostomy takedown   EXPLORATORY LAPAROTOMY  11/27/15   illeostomy  07/2014   POLYPECTOMY  10/15   GI surgery to remove polyp   TEE WITHOUT CARDIOVERSION N/A 05/06/2023   Procedure: TRANSESOPHAGEAL ECHOCARDIOGRAM (TEE);  Surgeon: Custovic, Sabina, DO;  Location: [facility];  Service: Cardiovascular;  Laterality: N/A;       Home Medications    Prior to Admission medications   Medication Sig Start Date End Date Taking? Authorizing Provider  ondansetron  (ZOFRAN -ODT) 4 MG disintegrating tablet Take 1 tablet (4 mg total) by mouth every 6 (six) hours as needed for nausea or vomiting. 08/25/24  Yes Arvis Huxley B, PA-C  allopurinol  (ZYLOPRIM ) 300 MG tablet Take 300 mg by mouth daily.    [provider]  apixaban  (ELIQUIS ) 5 MG TABS tablet Take 1 tablet by mouth every 12 (twelve) hours. 05/10/21   [provider]  Ascorbic Acid  (VITAMIN C) 1000 MG tablet Take 1 tablet by mouth daily. 05/16/20   [provider]  calcitRIOL  (ROCALTROL ) 0.25 MCG capsule Take 0.25 mcg daily by mouth.  05/26/17   [provider]  Cholecalciferol  (VITAMIN D3) 2000 units capsule Take 2,000 Units by mouth daily.    [provider]  cyanocobalamin  500 MCG tablet Take 500 mcg by mouth daily.    [provider]  diltiazem  (CARDIZEM  CD) 120 MG 24 hr capsule Take 120 mg by mouth daily. 05/17/21   [provider]  doxazosin  (CARDURA ) 1 MG tablet Take 1 tablet (1 mg total) by mouth daily. 06/03/24   Tapia, Leisa, PA-C  famotidine  (PEPCID ) 20 MG tablet Take 1 tablet by mouth once daily 05/31/24   Tapia, Leisa, PA-C  ferrous sulfate  325 (65 FE) MG tablet Take 325 mg by mouth 3 (three) times a week. M-W-F    [provider]  FLUoxetine  (PROZAC ) 20 MG tablet Take 1 tablet (20 mg total) by mouth daily. 06/03/24    Tapia, Leisa, PA-C  levothyroxine  (SYNTHROID ) 88 MCG tablet Take 1 tablet (88 mcg total) by mouth daily before breakfast. 06/03/24   Tapia, Leisa, PA-C  losartan  (COZAAR ) 25 MG tablet Take 1 tablet by mouth daily. 02/23/21   [provider]  methylphenidate  (RITALIN ) 5 MG tablet Take 5 mg po every morning and may take 5 mg po as needed in afternoon 07/05/24   Tapia, Leisa, PA-C  methylphenidate  (RITALIN ) 5 MG tablet Take 5 mg po every morning and may take 5 mg po as needed in afternoon 08/30/24   Tapia, Leisa, PA-C  mirtazapine  (REMERON ) 15 MG tablet Take 1 tablet (15 mg total) by mouth at bedtime. 06/03/24   Tapia, Leisa, PA-C  rosuvastatin  (CRESTOR ) 10 MG tablet Take 1 tablet (10 mg total) by mouth daily. 06/03/24   Leavy Mole, PA-C    Family History Family History  Problem Relation Age of Onset   Arthritis Mother    Heart disease Mother    Heart attack Father  Heart disease Father    Cirrhosis Sister    Hyperparathyroidism Neg Hx     Social History Social History   Tobacco Use   Smoking status: Never   Smokeless tobacco: Never   Tobacco comments:    smoking cessation materials not required  Vaping Use   Vaping status: Never Used  Substance Use Topics   Alcohol use: No   Drug use: No     Allergies   Bactrim [sulfamethoxazole-trimethoprim], Chlorpromazine, Cyclobenzaprine, and Oxycodone   Review of Systems Review of Systems  Constitutional:  Positive for appetite change. Negative for fatigue and fever.  HENT:  Negative for congestion, rhinorrhea and sore throat.   Respiratory:  Negative for cough and shortness of breath.   Cardiovascular:  Negative for chest pain.  Gastrointestinal:  Positive for abdominal pain, diarrhea, nausea and vomiting. Negative for blood in stool and constipation.  Genitourinary:  Negative for decreased urine volume and dysuria.  Musculoskeletal:  Negative for myalgias.  Neurological:  Negative for dizziness, weakness, light-headedness  and headaches.  Hematological:  Negative for adenopathy.     Physical Exam Triage Vital Signs ED Triage Vitals  Encounter Vitals Group     BP 08/25/24 1447 122/74     Girls Systolic BP Percentile --      Girls Diastolic BP Percentile --      Boys Systolic BP Percentile --      Boys Diastolic BP Percentile --      Pulse Rate 08/25/24 1447 90     Resp 08/25/24 1447 18     Temp 08/25/24 1447 98.6 F (37 C)     Temp Source 08/25/24 1447 Oral     SpO2 08/25/24 1447 96 %     Weight 08/25/24 1444 200 lb 14.4 oz (91.1 kg)     Height --      Head Circumference --      Peak Flow --      Pain Score 08/25/24 1447 2     Pain Loc --      Pain Education --      Exclude from Growth Chart --    No data found.  Updated Vital Signs BP 122/74 (BP Location: Right Arm)   Pulse 90   Temp 98.6 F (37 C) (Oral)   Resp 18   Wt 200 lb 14.4 oz (91.1 kg)   SpO2 96%   BMI 24.45 kg/m       Physical Exam Vitals and nursing note reviewed.  Constitutional:      General: He is not in acute distress.    Appearance: Normal appearance. He is well-developed. He is ill-appearing.  HENT:     Head: Normocephalic and atraumatic.     Nose: Nose normal.     Mouth/Throat:     Mouth: Mucous membranes are moist.     Pharynx: Oropharynx is clear.  Eyes:     General: No scleral icterus.    Conjunctiva/sclera: Conjunctivae normal.  Cardiovascular:     Rate and Rhythm: Normal rate and regular rhythm.  Pulmonary:     Effort: Pulmonary effort is normal. No respiratory distress.     Breath sounds: Normal breath sounds.  Abdominal:     Palpations: Abdomen is soft.     Tenderness: There is abdominal tenderness in the epigastric area and periumbilical area. There is no right CVA tenderness, left CVA tenderness, guarding or rebound.  Musculoskeletal:     Cervical back: Neck supple.  Skin:    General: Skin  is warm and dry.     Capillary Refill: Capillary refill takes less than 2 seconds.  Neurological:      General: No focal deficit present.     Mental Status: He is alert. Mental status is at baseline.     Motor: No weakness.     Gait: Gait normal.  Psychiatric:        Mood and Affect: Mood normal.        Behavior: Behavior normal.      UC Treatments / Results  Labs (all labs ordered are listed, but only abnormal results are displayed) Labs Reviewed  POC SOFIA SARS ANTIGEN FIA - Normal    EKG   Radiology No results found.  Procedures Procedures (including critical care time)  Medications Ordered in UC Medications  ondansetron  (ZOFRAN -ODT) disintegrating tablet 8 mg (8 mg Oral Given 08/25/24 1543)    Initial Impression / Assessment and Plan / UC Course  I have reviewed the triage vital signs and the nursing notes.  Pertinent labs & imaging results that were available during my care of the patient were reviewed by me and considered in my medical decision making (see chart for details).   73 year old male with history of hypertension, gout, chronic kidney disease, hyperlipidemia, hypothyroidism, restrictive lung disease, atrial fibrillation, previous stroke with left hemiparesis, GERD, mitral regurgitation, aortic regurgitation, abdominal surgeries, and long-term use of anticoagulants presents today for upper abdominal pain, nausea/vomiting and diarrhea since yesterday.  Has taken Pepcid  20 mg, Tylenol , and Mylanta.  Symptoms are better today than they were yesterday.  Vitals are all stable and normal.  He is ill-appearing but nontoxic.  Normal HEENT exam.  Chest clear.  Heart regular rate and rhythm.  Abdomen soft with tenderness of epigastric and periumbilical region without guarding or rebound.  No CVA tenderness.  COVID test performed.  Negative.  Patient given 8 mg ODT Zofran  in clinic.  Advised patient of numerous different potential causes of his abdominal pain from gastritis/GERD, viral gastroenteritis, PUD, pancreatitis, cholelithiasis, cholecystitis, bowel  obstruction, excetra.  Patient would like to avoid ED.  Advised at this time to increase his famotidine  to twice daily as he used to take it and continue Mylanta, rest and fluids, bland diet.  He reports noting a improvement after Mylanta and Tylenol .  Advised making PCP and GI appointments to follow-up.  Discussed going to ED if abdominal pain worsens or he develops fever or if the pain is not improving in the next 24 hours as he may need labs, imaging and potentially IV fluids if he is becoming dehydrated.  Patient is agreeable to plan.   Final Clinical Impressions(s) / UC Diagnoses   Final diagnoses:  Nausea and vomiting, unspecified vomiting type  Upper abdominal pain  Gastroenteritis  Gastroesophageal reflux disease, unspecified whether esophagitis present     Discharge Instructions      - Gastritis/gastroenteritis, possible GERD flare up or stomach ulcer. - Increase famotidine  to twice a day and may continue Mylanta, increased rest and fluids. - I sent Zofran  to the pharmacy as needed for nausea and vomiting. - Avoid triggering foods. - You can continue Tylenol . - If abdominal pain is not improving over the next 24 hours or if it worsens or you develop fever, increased sweats, weakness or signs of dehydration go to the emergency department for lab work, abdominal imaging and possible IV fluids. - Make an appointment with GI specialist if symptoms are ongoing as you might need an upper endoscopy to look  further for stomach ulcers.  ABDOMINAL PAIN: You may take Tylenol  for pain relief. Use medications as directed including antiemetics and antidiarrheal medications if suggested or prescribed. You should increase fluids and electrolytes as well as rest over these next several days. If you have any questions or concerns, or if your symptoms are not improving or if especially if they acutely worsen, please call or stop back to the clinic immediately and we will be happy to help you or go to  the ER   ABDOMINAL PAIN RED FLAGS: Seek immediate further care if: symptoms remain the same or worsen over the next 3-7 days, you are unable to keep fluids down, you see blood or mucus in your stool, you vomit black or dark red material, you have a fever of 101.F or higher, you have localized and/or persistent abdominal pain       ED Prescriptions     Medication Sig Dispense Auth. Provider   ondansetron  (ZOFRAN -ODT) 4 MG disintegrating tablet Take 1 tablet (4 mg total) by mouth every 6 (six) hours as needed for nausea or vomiting. 15 tablet Avabella Wailes B, PA-C      PDMP not reviewed this encounter.   Arvis Jolan NOVAK, PA-C 08/25/24 1546

## 2024-08-25 NOTE — Discharge Instructions (Addendum)
-   Gastritis/gastroenteritis, possible GERD flare up or stomach ulcer. - Increase famotidine  to twice a day and may continue Mylanta, increased rest and fluids. - I sent Zofran  to the pharmacy as needed for nausea and vomiting. - Avoid triggering foods. - You can continue Tylenol . - If abdominal pain is not improving over the next 24 hours or if it worsens or you develop fever, increased sweats, weakness or signs of dehydration go to the emergency department for lab work, abdominal imaging and possible IV fluids. - Make an appointment with GI specialist if symptoms are ongoing as you might need an upper endoscopy to look further for stomach ulcers.  ABDOMINAL PAIN: You may take Tylenol  for pain relief. Use medications as directed including antiemetics and antidiarrheal medications if suggested or prescribed. You should increase fluids and electrolytes as well as rest over these next several days. If you have any questions or concerns, or if your symptoms are not improving or if especially if they acutely worsen, please call or stop back to the clinic immediately and we will be happy to help you or go to the ER   ABDOMINAL PAIN RED FLAGS: Seek immediate further care if: symptoms remain the same or worsen over the next 3-7 days, you are unable to keep fluids down, you see blood or mucus in your stool, you vomit black or dark red material, you have a fever of 101.F or higher, you have localized and/or persistent abdominal pain

## 2024-08-25 NOTE — Telephone Encounter (Signed)
 FYI Only or Action Required?: FYI only for provider: Pt going to UC for symptoms.  Patient was last seen in primary care on 06/03/2024 by Leavy Mole, PA-C.  Called Nurse Triage reporting Vomiting.  Symptoms began yesterday.  Interventions attempted: OTC medications: Mylanta.  Symptoms are: gradually worsening.  Triage Disposition: See Physician Within 24 Hours  Patient/caregiver understands and will follow disposition?: Yes        Copied from CRM [id number]. Topic: Clinical - Red Word Triage >> Aug 25, 2024  1:48 PM Antwanette L wrote: Red Word that prompted transfer to Nurse Triage: Possible ulcer or covid.Comer, the patient's wife, called to report that the patient has been experiencing vomiting, stomach pain, and diarrhea since yesterday. Reason for Disposition  [1] MILD or MODERATE vomiting AND [2] present > 48 hours (2 days)  (Exception: Mild vomiting with associated diarrhea.)  Answer Assessment - Initial Assessment Questions This RN spoke with pt's wife regarding the pt's symptoms. Pt taking Mylanta for symptoms. Patient denies a fever. Pt and pt's wife given information to be seen today for symptoms. Pt states he will walk into [facility].   1. VOMITING SEVERITY: How many times have you vomited in the past 24 hours?      2 times  2. ONSET: When did the vomiting begin?      Last night  3. FLUIDS: What fluids or food have you vomited up today? Have you been able to keep any fluids down?     Was able to eat oatmeal and keep it down.  4. ABDOMEN PAIN: Are your having any abdomen pain? If Yes : How bad is it and what does it feel like? (e.g., crampy, dull, intermittent, constant)      Comes and goes and pt states it feels like an ulcer. Pt states abd pain in mild in nature.  5. DIARRHEA: Is there any diarrhea? If Yes, ask: How many times today?      Yes, one episode  6. CONTACTS: Is there anyone else in the family with the same  symptoms?      Denies  7. CAUSE: What do you think is causing your vomiting?     Ulcer or Covid  8. HYDRATION STATUS: Any signs of dehydration? (e.g., dry mouth [not only dry lips], too weak to stand) When did you last urinate?     Drinking Gatorade and sprite.  9. OTHER SYMPTOMS: Do you have any other symptoms? (e.g., fever, headache, vertigo, vomiting blood or coffee grounds, recent head injury)     Weakness  Protocols used: Vomiting-A-AH

## 2024-08-30 ENCOUNTER — Other Ambulatory Visit: Payer: Self-pay | Admitting: Family Medicine

## 2024-08-30 DIAGNOSIS — K219 Gastro-esophageal reflux disease without esophagitis: Secondary | ICD-10-CM

## 2024-09-01 DIAGNOSIS — N2581 Secondary hyperparathyroidism of renal origin: Secondary | ICD-10-CM | POA: Diagnosis not present

## 2024-09-01 DIAGNOSIS — I10 Essential (primary) hypertension: Secondary | ICD-10-CM | POA: Diagnosis not present

## 2024-09-01 DIAGNOSIS — D631 Anemia in chronic kidney disease: Secondary | ICD-10-CM | POA: Diagnosis not present

## 2024-09-01 DIAGNOSIS — N184 Chronic kidney disease, stage 4 (severe): Secondary | ICD-10-CM | POA: Diagnosis not present

## 2024-09-01 NOTE — Telephone Encounter (Signed)
 Requested Prescriptions  Pending Prescriptions Disp Refills   famotidine  (PEPCID ) 20 MG tablet [Pharmacy Med Name: Famotidine  20 MG Oral Tablet] 90 tablet 1    Sig: Take 1 tablet by mouth once daily     Gastroenterology:  H2 Antagonists Passed - 09/01/2024  8:27 AM      Passed - Valid encounter within last 12 months    Recent Outpatient Visits           3 months ago Hypothyroidism following radioiodine therapy   [facility] Leavy Mole, PA-C   9 months ago Medication refill   [facility] Leavy Mole, [place]       Future Appointments             In 3 months Tapia, Leisa, PA-C [facility], [place]

## 2024-09-13 DIAGNOSIS — M5431 Sciatica, right side: Secondary | ICD-10-CM | POA: Diagnosis not present

## 2024-09-13 DIAGNOSIS — M79604 Pain in right leg: Secondary | ICD-10-CM | POA: Diagnosis not present

## 2024-12-06 ENCOUNTER — Ambulatory Visit: Admitting: Family Medicine

## 2025-07-14 ENCOUNTER — Ambulatory Visit
